# Patient Record
Sex: Male | Born: 1940 | Race: White | Hispanic: No | Smoking: Current every day smoker
Health system: Southern US, Community
[De-identification: ages and names within clinical notes are randomized; demographics above are authoritative.]

## PROBLEM LIST (undated history)

## (undated) DIAGNOSIS — C801 Malignant (primary) neoplasm, unspecified: Secondary | ICD-10-CM

## (undated) DIAGNOSIS — G4733 Obstructive sleep apnea (adult) (pediatric): Secondary | ICD-10-CM

## (undated) DIAGNOSIS — K254 Chronic or unspecified gastric ulcer with hemorrhage: Secondary | ICD-10-CM

## (undated) DIAGNOSIS — J449 Chronic obstructive pulmonary disease, unspecified: Secondary | ICD-10-CM

## (undated) DIAGNOSIS — L0390 Cellulitis, unspecified: Secondary | ICD-10-CM

## (undated) DIAGNOSIS — K635 Polyp of colon: Secondary | ICD-10-CM

## (undated) DIAGNOSIS — N400 Enlarged prostate without lower urinary tract symptoms: Secondary | ICD-10-CM

## (undated) DIAGNOSIS — I10 Essential (primary) hypertension: Secondary | ICD-10-CM

## (undated) DIAGNOSIS — M549 Dorsalgia, unspecified: Secondary | ICD-10-CM

## (undated) DIAGNOSIS — K5521 Angiodysplasia of colon with hemorrhage: Secondary | ICD-10-CM

## (undated) DIAGNOSIS — M1990 Unspecified osteoarthritis, unspecified site: Secondary | ICD-10-CM

## (undated) DIAGNOSIS — E119 Type 2 diabetes mellitus without complications: Secondary | ICD-10-CM

## (undated) DIAGNOSIS — E785 Hyperlipidemia, unspecified: Secondary | ICD-10-CM

## (undated) DIAGNOSIS — I739 Peripheral vascular disease, unspecified: Secondary | ICD-10-CM

## (undated) DIAGNOSIS — G8929 Other chronic pain: Secondary | ICD-10-CM

## (undated) DIAGNOSIS — Z9989 Dependence on other enabling machines and devices: Secondary | ICD-10-CM

## (undated) DIAGNOSIS — I779 Disorder of arteries and arterioles, unspecified: Secondary | ICD-10-CM

## (undated) DIAGNOSIS — K269 Duodenal ulcer, unspecified as acute or chronic, without hemorrhage or perforation: Secondary | ICD-10-CM

## (undated) HISTORY — DX: Benign prostatic hyperplasia without lower urinary tract symptoms: N40.0

## (undated) HISTORY — DX: Other chronic pain: G89.29

## (undated) HISTORY — PX: JOINT REPLACEMENT: SHX530

## (undated) HISTORY — DX: Chronic obstructive pulmonary disease, unspecified: J44.9

## (undated) HISTORY — DX: Obstructive sleep apnea (adult) (pediatric): G47.33

## (undated) HISTORY — PX: REPLACEMENT TOTAL KNEE: SUR1224

## (undated) HISTORY — DX: Dependence on other enabling machines and devices: Z99.89

## (undated) HISTORY — DX: Disorder of arteries and arterioles, unspecified: I77.9

## (undated) HISTORY — DX: Peripheral vascular disease, unspecified: I73.9

## (undated) HISTORY — DX: Cellulitis, unspecified: L03.90

## (undated) HISTORY — DX: Dorsalgia, unspecified: M54.9

## (undated) HISTORY — PX: RETINAL DETACHMENT SURGERY: SHX105

## (undated) HISTORY — PX: CHOLECYSTECTOMY: SHX55

---

## 1963-12-26 HISTORY — PX: THROAT SURGERY: SHX803

## 2004-11-27 LAB — BASIC METABOLIC PANEL: GLUCOSE: 43 mg/dL

## 2014-05-30 LAB — PSA: PSA: 2.2

## 2014-06-09 LAB — TSH: TSH: 3.75 u[IU]/mL (ref <=5.90)

## 2014-11-27 LAB — HEPATIC FUNCTION PANEL
ALK PHOS: 64 U/L (ref 25–125)
ALT: 17 U/L (ref 10–40)
AST: 35 U/L (ref 14–40)
Bilirubin, Total: 0.5 mg/dL

## 2014-11-27 LAB — BASIC METABOLIC PANEL
BUN: 24 mg/dL — AB (ref 4–21)
Creatinine: 1 mg/dL (ref <=1.3)
Potassium: 4.2 mmol/L (ref 3.4–5.3)
Sodium: 143 mmol/L (ref 137–147)

## 2014-11-27 LAB — HEMOGLOBIN A1C: HEMOGLOBIN A1C: 5.5 % (ref 4.0–6.0)

## 2015-03-01 ENCOUNTER — Encounter (HOSPITAL_BASED_OUTPATIENT_CLINIC_OR_DEPARTMENT_OTHER): Payer: Self-pay | Admitting: *Deleted

## 2015-03-01 ENCOUNTER — Emergency Department (HOSPITAL_BASED_OUTPATIENT_CLINIC_OR_DEPARTMENT_OTHER)
Admission: EM | Admit: 2015-03-01 | Discharge: 2015-03-01 | Disposition: A | Discharge status: Home / Self Care | Payer: Medicare Other | Attending: Emergency Medicine | Admitting: Emergency Medicine

## 2015-03-01 DIAGNOSIS — E119 Type 2 diabetes mellitus without complications: Secondary | ICD-10-CM | POA: Insufficient documentation

## 2015-03-01 DIAGNOSIS — I10 Essential (primary) hypertension: Secondary | ICD-10-CM | POA: Insufficient documentation

## 2015-03-01 DIAGNOSIS — E785 Hyperlipidemia, unspecified: Secondary | ICD-10-CM | POA: Insufficient documentation

## 2015-03-01 DIAGNOSIS — Z79899 Other long term (current) drug therapy: Secondary | ICD-10-CM | POA: Insufficient documentation

## 2015-03-01 DIAGNOSIS — Z720 Tobacco use: Secondary | ICD-10-CM | POA: Insufficient documentation

## 2015-03-01 DIAGNOSIS — L0231 Cutaneous abscess of buttock: Secondary | ICD-10-CM | POA: Insufficient documentation

## 2015-03-01 HISTORY — DX: Essential (primary) hypertension: I10

## 2015-03-01 HISTORY — DX: Type 2 diabetes mellitus without complications: E11.9

## 2015-03-01 HISTORY — DX: Hyperlipidemia, unspecified: E78.5

## 2015-03-01 MED ORDER — LIDOCAINE HCL (PF) 1 % IJ SOLN
INTRAMUSCULAR | Status: AC
  Administered 2015-03-01: 5 mL
  Filled 2015-03-01: qty 5

## 2015-03-01 MED ORDER — CEPHALEXIN 500 MG PO CAPS: 500.0000 mg | ORAL_CAPSULE | Freq: Four times a day (QID) | ORAL | Status: DC

## 2015-03-01 MED ORDER — SULFAMETHOXAZOLE-TRIMETHOPRIM 800-160 MG PO TABS: 1.0000 | ORAL_TABLET | Freq: Two times a day (BID) | ORAL | Status: DC

## 2015-03-01 NOTE — ED Notes (Signed)
D/c home- directed to pharmacy to pick up meds

## 2015-03-01 NOTE — ED Provider Notes (Signed)
CSN: [id number]     Arrival date & time 03/01/15  1408 History   First MD Initiated Contact with Patient 03/01/15 1442     Chief Complaint  Patient presents with  . Abscess     (Consider location/radiation/quality/duration/timing/severity/associated sxs/prior Treatment) Patient is a 74 y.o. male presenting with abscess. The history is provided by the patient.  Abscess Abscess location: Left buttock. Abscess quality: fluctuance, induration, painful and redness   Abscess quality: not draining   Duration:  4 days Progression:  Worsening Pain details:    Severity:  Moderate   Timing:  Constant   Progression:  Worsening Chronicity:  New   Past Medical History  Diagnosis Date  . Hypertension   . Diabetes mellitus without complication   . Hyperlipidemia    Past Surgical History  Procedure Laterality Date  . Replacement total knee    . Cholecystectomy     No family history on file. History  Substance Use Topics  . Smoking status: Current Every Day Smoker  . Smokeless tobacco: Not on file  . Alcohol Use: Yes    Review of Systems  All other systems reviewed and are negative.     Allergies  Review of patient's allergies indicates no known allergies.  Home Medications   Prior to Admission medications   Medication Sig Start Date End Date Taking? Authorizing Provider  cyclobenzaprine (FLEXERIL) 10 MG tablet Take 10 mg by mouth 3 (three) times daily as needed for muscle spasms.   Yes Historical Provider, MD  lisinopril-hydrochlorothiazide (PRINZIDE,ZESTORETIC) 20-25 MG per tablet Take 1 tablet by mouth daily.   Yes Historical Provider, MD  metFORMIN (GLUCOPHAGE) 500 MG tablet Take by mouth 2 (two) times daily with a meal.   Yes Historical Provider, MD  naproxen sodium (ANAPROX) 550 MG tablet Take 550 mg by mouth 2 (two) times daily with a meal.   Yes Historical Provider, MD  Pitavastatin Calcium 2 MG TABS Take by mouth.   Yes Historical Provider, MD   BP 148/65 mmHg   Pulse 74  Temp(Src) 97.3 F (36.3 C) (Oral)  Resp 18  Ht 5\' 11"  (1.803 m)  Wt 220 lb (99.791 kg)  BMI 30.70 kg/m2  SpO2 98% Physical Exam  Constitutional: He is oriented to person, place, and time. He appears well-developed and well-nourished. No distress.  HENT:  Head: Normocephalic and atraumatic.  Neck: Normal range of motion. Neck supple.  Neurological: He is alert and oriented to person, place, and time.  Skin: He is not diaphoretic.  There is a 3 cm round, indurated, erythematous abscess to the left buttock.  Nursing note and vitals reviewed.   ED Course  Procedures (including critical care time) Labs Review Labs Reviewed - No data to display  Imaging Review No results found.  INCISION AND DRAINAGE Performed by: Veryl Speak Consent: Verbal consent obtained. Risks and benefits: risks, benefits and alternatives were discussed Type: abscess  Body area: Left buttock  Anesthesia: local infiltration  Incision was made with a scalpel.  Local anesthetic: lidocaine 1 % without epinephrine  Anesthetic total: 2 ml  Complexity: complex Blunt dissection to break up loculations  Drainage: purulent  Drainage amount: Moderate   Packing material: None   Patient tolerance: Patient tolerated the procedure well with no immediate complications.      MDM   Final diagnoses:  None    Patient presents with buttock abscess. This was incised and drained of a significant quantity of purulent material. There is some redness to the  overlying skin. We'll treat with Keflex, Bactrim, warm soaks, and when necessary return.    Veryl Speak, MD 03/01/15 [id number]

## 2015-03-01 NOTE — Discharge Instructions (Signed)
Keflex and Bactrim as prescribed.  Warm soaks to the area as frequently as possible for the next 2-3 days.  Return to the ER for increased redness, pain, fever, or other new and concerning symptoms.   Abscess An abscess is an infected area that contains a collection of pus and debris.It can occur in almost any part of the body. An abscess is also known as a furuncle or boil. CAUSES  An abscess occurs when tissue gets infected. This can occur from blockage of oil or sweat glands, infection of hair follicles, or a minor injury to the skin. As the body tries to fight the infection, pus collects in the area and creates pressure under the skin. This pressure causes pain. People with weakened immune systems have difficulty fighting infections and get certain abscesses more often.  SYMPTOMS Usually an abscess develops on the skin and becomes a painful mass that is red, warm, and tender. If the abscess forms under the skin, you may feel a moveable soft area under the skin. Some abscesses break open (rupture) on their own, but most will continue to get worse without care. The infection can spread deeper into the body and eventually into the bloodstream, causing you to feel ill.  DIAGNOSIS  Your caregiver will take your medical history and perform a physical exam. A sample of fluid may also be taken from the abscess to determine what is causing your infection. TREATMENT  Your caregiver may prescribe antibiotic medicines to fight the infection. However, taking antibiotics alone usually does not cure an abscess. Your caregiver may need to make a small cut (incision) in the abscess to drain the pus. In some cases, gauze is packed into the abscess to reduce pain and to continue draining the area. HOME CARE INSTRUCTIONS   Only take over-the-counter or prescription medicines for pain, discomfort, or fever as directed by your caregiver.  If you were prescribed antibiotics, take them as directed. Finish them  even if you start to feel better.  If gauze is used, follow your caregiver's directions for changing the gauze.  To avoid spreading the infection:  Keep your draining abscess covered with a bandage.  Wash your hands well.  Do not share personal care items, towels, or whirlpools with others.  Avoid skin contact with others.  Keep your skin and clothes clean around the abscess.  Keep all follow-up appointments as directed by your caregiver. SEEK MEDICAL CARE IF:   You have increased pain, swelling, redness, fluid drainage, or bleeding.  You have muscle aches, chills, or a general ill feeling.  You have a fever. MAKE SURE YOU:   Understand these instructions.  Will watch your condition.  Will get help right away if you are not doing well or get worse. Document Released: 09/20/2005 Document Revised: 06/11/2012 Document Reviewed: 02/23/2012 [facility] Patient Information 2015 [place], [place]. This information is not intended to replace advice given to you by your health care provider. Make sure you discuss any questions you have with your health care provider.

## 2015-03-01 NOTE — ED Notes (Signed)
Pt has a painful abscess on his buttocks x4-5 days.

## 2015-03-17 ENCOUNTER — Telehealth: Payer: Self-pay | Admitting: *Deleted

## 2015-03-17 NOTE — Telephone Encounter (Signed)
Medical records received via fax from [facility] in [place], [place]. Forwarded to [employer]. New pt appt scheduled for 04/23/15. JG//CMA

## 2015-03-24 ENCOUNTER — Ambulatory Visit: Payer: Medicare Other | Admitting: Physician Assistant

## 2015-04-16 DIAGNOSIS — M7541 Impingement syndrome of right shoulder: Secondary | ICD-10-CM | POA: Diagnosis not present

## 2015-04-22 ENCOUNTER — Encounter: Payer: Self-pay | Admitting: General Practice

## 2015-04-23 ENCOUNTER — Ambulatory Visit: Payer: Medicare Other | Admitting: Family Medicine

## 2015-05-18 ENCOUNTER — Other Ambulatory Visit: Payer: Self-pay | Admitting: Orthopaedic Surgery

## 2015-05-21 ENCOUNTER — Encounter (HOSPITAL_BASED_OUTPATIENT_CLINIC_OR_DEPARTMENT_OTHER)
Admission: RE | Admit: 2015-05-21 | Discharge: 2015-05-21 | Disposition: A | Discharge status: Home / Self Care | Payer: Medicare Other | Source: Ambulatory Visit | Attending: Orthopaedic Surgery | Admitting: Orthopaedic Surgery

## 2015-05-21 ENCOUNTER — Encounter (HOSPITAL_BASED_OUTPATIENT_CLINIC_OR_DEPARTMENT_OTHER): Payer: Self-pay | Admitting: *Deleted

## 2015-05-21 DIAGNOSIS — Z01818 Encounter for other preprocedural examination: Secondary | ICD-10-CM | POA: Insufficient documentation

## 2015-05-21 LAB — BASIC METABOLIC PANEL
ANION GAP: 8 (ref 5–15)
BUN: 26 mg/dL — ABNORMAL HIGH (ref 6–20)
CALCIUM: 10.1 mg/dL (ref 8.9–10.3)
CHLORIDE: 102 mmol/L (ref 101–111)
CO2: 29 mmol/L (ref 22–32)
Creatinine, Ser: 1.29 mg/dL — ABNORMAL HIGH (ref 0.61–1.24)
GFR calc Af Amer: >60 mL/min (ref >60)
GFR calc non Af Amer: 52 mL/min — ABNORMAL LOW (ref >60)
Glucose, Bld: 103 mg/dL — ABNORMAL HIGH (ref 65–99)
Potassium: 4.3 mmol/L (ref 3.5–5.1)
SODIUM: 139 mmol/L (ref 135–145)

## 2015-05-21 NOTE — Progress Notes (Signed)
Patient came in for preadmission history and blood work and EKG. States he lives alone, has a nephew who can bring him and pick him up but cannot stay the night. Told pt he would have to stay night in [place], have someone pick him up by 0730.

## 2015-05-26 NOTE — H&P (Signed)
Todd George is an 74 y.o. male.   Chief Complaint: right shoulder pain HPI: [place] continues with some terrible right shoulder pain.  When he tries to use it out to the side or overhead it hurts severely.  He has had trouble for about 10 years.  Normally he can sleep fairly well at night.  He has had 2 injections.    MRI:  I reviewed an MRI scan films and report of a study done at the [facility] on 04/26/15.  This shows some irritation of his rotator cuff with no significant tear.  They also read some moderate degeneration of the [facility] joint.  Past Medical History  Diagnosis Date  . Hypertension   . Diabetes mellitus without complication   . Hyperlipidemia   . Cellulitis   . BPH (benign prostatic hyperplasia)   . Chronic back pain   . Carotid artery disease   . COPD (chronic obstructive pulmonary disease)     2ppd  . OSA on CPAP     not used machine in over 10 yrs    Past Surgical History  Procedure Laterality Date  . Replacement total knee Left   . Cholecystectomy    . Joint replacement    . Throat surgery  1965    History reviewed. No pertinent family history. Social History:  reports that he has been smoking.  He does not have any smokeless tobacco history on file. He reports that he drinks alcohol. He reports that he does not use illicit drugs.  Allergies: No Known Allergies  No prescriptions prior to admission    No results found for this or any previous visit (from the past 48 hour(s)). No results found.  Review of Systems  Musculoskeletal: Positive for joint pain.       Right shoulder  All other systems reviewed and are negative.   Height 6\' 1"  (1.854 m), weight 104.327 kg (230 lb). Physical Exam  Constitutional: He is oriented to person, place, and time. He appears well-developed and well-nourished.  HENT:  Head: Normocephalic and atraumatic.  Eyes: Conjunctivae are normal. Pupils are equal, round, and reactive to light.  Neck: Normal range of motion.   Cardiovascular: Normal rate and regular rhythm.   Respiratory: Effort normal.  GI: Soft.  Musculoskeletal:  Right shoulder motion is nearly full.  He has some significant secondary impingement pain.  He has good cuff strength though it's very painful to resisted forward flexion.  Cervical motion is full and there is no palpable lymphadenopathy.  He does have some pain at his [facility] joint today and some pain to cross chest maneuvers.  Sensation and motor function are intact in his hands with palpable pulses on both sides.    Neurological: He is alert and oriented to person, place, and time.  Skin: Skin is warm and dry.  Psychiatric: He has a normal mood and affect. His behavior is normal. Judgment and thought content normal.     Assessment/Plan Assessment:  Right shoulder impingement and AC degeneration injected most recently 03/20/15  Plan: [place] has some disabling pain in the shoulder.  I think he would be a good candidate for an arthroscopy.  Our plan will be to perform an acromioplasty and formal AC resection.  I reviewed risk of anesthesia and infection related to this intervention.  He says his niece in law might be able to drive him home and stay with him the first night.    Teiara Baria, Larwance Sachs 05/26/2015, 1:36 PM

## 2015-05-28 ENCOUNTER — Ambulatory Visit (HOSPITAL_BASED_OUTPATIENT_CLINIC_OR_DEPARTMENT_OTHER)
Admission: RE | Admit: 2015-05-28 | Discharge: 2015-05-28 | Disposition: A | Discharge status: Home / Self Care | Payer: Medicare Other | Source: Ambulatory Visit | Attending: Orthopaedic Surgery | Admitting: Orthopaedic Surgery

## 2015-05-28 ENCOUNTER — Encounter (HOSPITAL_BASED_OUTPATIENT_CLINIC_OR_DEPARTMENT_OTHER)
Admission: RE | Disposition: A | Discharge status: Home / Self Care | Payer: Self-pay | Source: Ambulatory Visit | Attending: Orthopaedic Surgery

## 2015-05-28 ENCOUNTER — Ambulatory Visit (HOSPITAL_BASED_OUTPATIENT_CLINIC_OR_DEPARTMENT_OTHER): Payer: Medicare Other | Admitting: Anesthesiology

## 2015-05-28 ENCOUNTER — Encounter (HOSPITAL_BASED_OUTPATIENT_CLINIC_OR_DEPARTMENT_OTHER): Payer: Self-pay | Admitting: Anesthesiology

## 2015-05-28 DIAGNOSIS — I10 Essential (primary) hypertension: Secondary | ICD-10-CM | POA: Diagnosis not present

## 2015-05-28 DIAGNOSIS — S46211A Strain of muscle, fascia and tendon of other parts of biceps, right arm, initial encounter: Secondary | ICD-10-CM | POA: Insufficient documentation

## 2015-05-28 DIAGNOSIS — E119 Type 2 diabetes mellitus without complications: Secondary | ICD-10-CM | POA: Diagnosis not present

## 2015-05-28 DIAGNOSIS — M25811 Other specified joint disorders, right shoulder: Secondary | ICD-10-CM | POA: Insufficient documentation

## 2015-05-28 DIAGNOSIS — I2510 Atherosclerotic heart disease of native coronary artery without angina pectoris: Secondary | ICD-10-CM | POA: Diagnosis not present

## 2015-05-28 DIAGNOSIS — Y929 Unspecified place or not applicable: Secondary | ICD-10-CM | POA: Diagnosis not present

## 2015-05-28 DIAGNOSIS — X58XXXA Exposure to other specified factors, initial encounter: Secondary | ICD-10-CM | POA: Insufficient documentation

## 2015-05-28 DIAGNOSIS — E785 Hyperlipidemia, unspecified: Secondary | ICD-10-CM | POA: Insufficient documentation

## 2015-05-28 DIAGNOSIS — N400 Enlarged prostate without lower urinary tract symptoms: Secondary | ICD-10-CM | POA: Diagnosis not present

## 2015-05-28 DIAGNOSIS — G4733 Obstructive sleep apnea (adult) (pediatric): Secondary | ICD-10-CM | POA: Diagnosis not present

## 2015-05-28 DIAGNOSIS — J449 Chronic obstructive pulmonary disease, unspecified: Secondary | ICD-10-CM | POA: Insufficient documentation

## 2015-05-28 DIAGNOSIS — G8929 Other chronic pain: Secondary | ICD-10-CM | POA: Insufficient documentation

## 2015-05-28 DIAGNOSIS — F17210 Nicotine dependence, cigarettes, uncomplicated: Secondary | ICD-10-CM | POA: Insufficient documentation

## 2015-05-28 DIAGNOSIS — M549 Dorsalgia, unspecified: Secondary | ICD-10-CM | POA: Diagnosis not present

## 2015-05-28 DIAGNOSIS — M19011 Primary osteoarthritis, right shoulder: Secondary | ICD-10-CM | POA: Diagnosis not present

## 2015-05-28 HISTORY — PX: SHOULDER ARTHROSCOPY WITH DISTAL CLAVICLE RESECTION: SHX5675

## 2015-05-28 HISTORY — PX: SHOULDER ARTHROSCOPY WITH SUBACROMIAL DECOMPRESSION: SHX5684

## 2015-05-28 LAB — GLUCOSE, CAPILLARY
GLUCOSE-CAPILLARY: 100 mg/dL — AB (ref 65–99)
GLUCOSE-CAPILLARY: 110 mg/dL — AB (ref 65–99)

## 2015-05-28 LAB — POCT HEMOGLOBIN-HEMACUE: HEMOGLOBIN: 15.3 g/dL (ref 13.0–17.0)

## 2015-05-28 SURGERY — SHOULDER ARTHROSCOPY WITH SUBACROMIAL DECOMPRESSION
Anesthesia: Regional | Site: Shoulder | Laterality: Right

## 2015-05-28 MED ORDER — FENTANYL CITRATE (PF) 100 MCG/2ML IJ SOLN
INTRAMUSCULAR | Status: AC
  Filled 2015-05-28: qty 2

## 2015-05-28 MED ORDER — CHLORHEXIDINE GLUCONATE 4 % EX LIQD: 60.0000 mL | Freq: Once | CUTANEOUS | Status: DC

## 2015-05-28 MED ORDER — PHENYLEPHRINE HCL 10 MG/ML IJ SOLN
INTRAMUSCULAR | Status: DC | PRN
  Administered 2015-05-28 (×2): 80 ug via INTRAVENOUS
  Administered 2015-05-28: 40 ug via INTRAVENOUS
  Administered 2015-05-28 (×2): 80 ug via INTRAVENOUS

## 2015-05-28 MED ORDER — HYDROCODONE-ACETAMINOPHEN 5-325 MG PO TABS: 1.0000 | ORAL_TABLET | Freq: Four times a day (QID) | ORAL | Status: DC | PRN

## 2015-05-28 MED ORDER — PHENYLEPHRINE HCL 10 MG/ML IJ SOLN
10.0000 mg | INTRAVENOUS | Status: DC | PRN
  Administered 2015-05-28: 50 ug/min via INTRAVENOUS

## 2015-05-28 MED ORDER — PROMETHAZINE HCL 25 MG/ML IJ SOLN: 6.2500 mg | INTRAMUSCULAR | Status: DC | PRN

## 2015-05-28 MED ORDER — PROPOFOL 10 MG/ML IV BOLUS
INTRAVENOUS | Status: DC | PRN
  Administered 2015-05-28: 150 mg via INTRAVENOUS

## 2015-05-28 MED ORDER — HYDROCODONE-ACETAMINOPHEN 7.5-325 MG PO TABS: 1.0000 | ORAL_TABLET | Freq: Once | ORAL | Status: DC | PRN

## 2015-05-28 MED ORDER — GLYCOPYRROLATE 0.2 MG/ML IJ SOLN
INTRAMUSCULAR | Status: DC | PRN
  Administered 2015-05-28 (×2): 0.2 mg via INTRAVENOUS

## 2015-05-28 MED ORDER — FENTANYL CITRATE (PF) 100 MCG/2ML IJ SOLN
INTRAMUSCULAR | Status: DC | PRN
  Administered 2015-05-28 (×2): 50 ug via INTRAVENOUS

## 2015-05-28 MED ORDER — LACTATED RINGERS IV SOLN: INTRAVENOUS | Status: DC

## 2015-05-28 MED ORDER — EPHEDRINE SULFATE 50 MG/ML IJ SOLN
INTRAMUSCULAR | Status: DC | PRN
  Administered 2015-05-28: 10 mg via INTRAVENOUS

## 2015-05-28 MED ORDER — HYDROMORPHONE HCL 1 MG/ML IJ SOLN: 0.2500 mg | INTRAMUSCULAR | Status: DC | PRN

## 2015-05-28 MED ORDER — CEFAZOLIN SODIUM-DEXTROSE 2-3 GM-% IV SOLR
2.0000 g | INTRAVENOUS | Status: AC
  Administered 2015-05-28: 2 g via INTRAVENOUS

## 2015-05-28 MED ORDER — LACTATED RINGERS IV SOLN
INTRAVENOUS | Status: DC
  Administered 2015-05-28 (×2): via INTRAVENOUS

## 2015-05-28 MED ORDER — CEFAZOLIN SODIUM-DEXTROSE 2-3 GM-% IV SOLR
INTRAVENOUS | Status: AC
  Filled 2015-05-28: qty 50

## 2015-05-28 MED ORDER — FENTANYL CITRATE (PF) 100 MCG/2ML IJ SOLN
50.0000 ug | INTRAMUSCULAR | Status: DC | PRN
  Administered 2015-05-28: 50 ug via INTRAVENOUS

## 2015-05-28 MED ORDER — LIDOCAINE HCL (CARDIAC) 20 MG/ML IV SOLN
INTRAVENOUS | Status: DC | PRN
  Administered 2015-05-28: 50 mg via INTRAVENOUS

## 2015-05-28 MED ORDER — DEXAMETHASONE SODIUM PHOSPHATE 4 MG/ML IJ SOLN
INTRAMUSCULAR | Status: DC | PRN
  Administered 2015-05-28: 10 mg via INTRAVENOUS

## 2015-05-28 MED ORDER — GLYCOPYRROLATE 0.2 MG/ML IJ SOLN
0.2000 mg | Freq: Once | INTRAMUSCULAR | Status: DC | PRN
Start: 2015-05-28 — End: 2015-05-28

## 2015-05-28 MED ORDER — MIDAZOLAM HCL 2 MG/2ML IJ SOLN
INTRAMUSCULAR | Status: AC
  Filled 2015-05-28: qty 2

## 2015-05-28 MED ORDER — ONDANSETRON HCL 4 MG/2ML IJ SOLN
INTRAMUSCULAR | Status: DC | PRN
  Administered 2015-05-28: 4 mg via INTRAVENOUS

## 2015-05-28 MED ORDER — SUCCINYLCHOLINE CHLORIDE 20 MG/ML IJ SOLN
INTRAMUSCULAR | Status: DC | PRN
  Administered 2015-05-28: 50 mg via INTRAVENOUS

## 2015-05-28 MED ORDER — ROPIVACAINE HCL 5 MG/ML IJ SOLN
INTRAMUSCULAR | Status: DC | PRN
  Administered 2015-05-28: 25 mL via PERINEURAL

## 2015-05-28 MED ORDER — FENTANYL CITRATE (PF) 100 MCG/2ML IJ SOLN
INTRAMUSCULAR | Status: AC
  Filled 2015-05-28: qty 6

## 2015-05-28 MED ORDER — MIDAZOLAM HCL 2 MG/2ML IJ SOLN
1.0000 mg | INTRAMUSCULAR | Status: DC | PRN
  Administered 2015-05-28: 1 mg via INTRAVENOUS

## 2015-05-28 SURGICAL SUPPLY — 65 items
BENZOIN TINCTURE PRP APPL 2/3 (GAUZE/BANDAGES/DRESSINGS) IMPLANT
BLADE CUDA 5.5 (BLADE) IMPLANT
BLADE GREAT WHITE 4.2 (BLADE) ×3 IMPLANT
BLADE SURG 15 STRL LF DISP TIS (BLADE) IMPLANT
BLADE SURG 15 STRL SS (BLADE)
BUR VERTEX HOODED 4.5 (BURR) IMPLANT
CANNULA SHOULDER 7CM (CANNULA) ×3 IMPLANT
CANNULA TWIST IN 8.25X7CM (CANNULA) IMPLANT
DECANTER SPIKE VIAL GLASS SM (MISCELLANEOUS) IMPLANT
DRAPE STERI 35X30 U-POUCH (DRAPES) ×3 IMPLANT
DRAPE U-SHAPE 47X51 STRL (DRAPES) ×3 IMPLANT
DRAPE U-SHAPE 76X120 STRL (DRAPES) ×6 IMPLANT
DRSG EMULSION OIL 3X3 NADH (GAUZE/BANDAGES/DRESSINGS) ×3 IMPLANT
DRSG PAD ABDOMINAL 8X10 ST (GAUZE/BANDAGES/DRESSINGS) ×3 IMPLANT
DURAPREP 26ML APPLICATOR (WOUND CARE) ×3 IMPLANT
ELECT MENISCUS 165MM 90D (ELECTRODE) IMPLANT
ELECT REM PT RETURN 9FT ADLT (ELECTROSURGICAL) ×3
ELECTRODE REM PT RTRN 9FT ADLT (ELECTROSURGICAL) ×2 IMPLANT
GAUZE SPONGE 4X4 12PLY STRL (GAUZE/BANDAGES/DRESSINGS) ×3 IMPLANT
GLOVE BIO SURGEON STRL SZ 6.5 (GLOVE) ×3 IMPLANT
GLOVE BIO SURGEON STRL SZ8 (GLOVE) ×6 IMPLANT
GLOVE BIOGEL PI IND STRL 7.0 (GLOVE) ×4 IMPLANT
GLOVE BIOGEL PI IND STRL 8 (GLOVE) ×4 IMPLANT
GLOVE BIOGEL PI INDICATOR 7.0 (GLOVE) ×2
GLOVE BIOGEL PI INDICATOR 8 (GLOVE) ×2
GOWN STRL REUS W/ TWL LRG LVL3 (GOWN DISPOSABLE) ×4 IMPLANT
GOWN STRL REUS W/ TWL XL LVL3 (GOWN DISPOSABLE) ×4 IMPLANT
GOWN STRL REUS W/TWL LRG LVL3 (GOWN DISPOSABLE) ×2
GOWN STRL REUS W/TWL XL LVL3 (GOWN DISPOSABLE) ×2
LIQUID BAND (GAUZE/BANDAGES/DRESSINGS) IMPLANT
MANIFOLD NEPTUNE II (INSTRUMENTS) ×3 IMPLANT
NDL SUT 6 .5 CRC .975X.05 MAYO (NEEDLE) IMPLANT
NEEDLE MAYO TAPER (NEEDLE)
NEEDLE SCORPION MULTI FIRE (NEEDLE) IMPLANT
NS IRRIG 1000ML POUR BTL (IV SOLUTION) IMPLANT
PACK ARTHROSCOPY DSU (CUSTOM PROCEDURE TRAY) ×3 IMPLANT
PACK BASIN DAY SURGERY FS (CUSTOM PROCEDURE TRAY) ×3 IMPLANT
PASSER SUT SWANSON 36MM LOOP (INSTRUMENTS) IMPLANT
PENCIL BUTTON HOLSTER BLD 10FT (ELECTRODE) IMPLANT
SET ARTHROSCOPY TUBING (MISCELLANEOUS) ×1
SET ARTHROSCOPY TUBING LN (MISCELLANEOUS) ×2 IMPLANT
SHEET MEDIUM DRAPE 40X70 STRL (DRAPES) ×3 IMPLANT
SLEEVE SCD COMPRESS KNEE MED (MISCELLANEOUS) IMPLANT
SLING ARM LRG ADULT FOAM STRAP (SOFTGOODS) ×3 IMPLANT
SLING ARM MED ADULT FOAM STRAP (SOFTGOODS) IMPLANT
SLING ARM SM FOAM STRAP (SOFTGOODS) IMPLANT
SLING ARM XL FOAM STRAP (SOFTGOODS) IMPLANT
SPONGE LAP 4X18 X RAY DECT (DISPOSABLE) IMPLANT
STRIP CLOSURE SKIN 1/2X4 (GAUZE/BANDAGES/DRESSINGS) IMPLANT
SUCTION FRAZIER TIP 10 FR DISP (SUCTIONS) IMPLANT
SUT ETHIBOND 2 OS 4 DA (SUTURE) IMPLANT
SUT ETHILON 3 0 PS 1 (SUTURE) ×3 IMPLANT
SUT FIBERWIRE #2 38 T-5 BLUE (SUTURE)
SUT PDS AB 2-0 CT2 27 (SUTURE) IMPLANT
SUT VIC AB 0 SH 27 (SUTURE) IMPLANT
SUT VIC AB 2-0 SH 27 (SUTURE)
SUT VIC AB 2-0 SH 27XBRD (SUTURE) IMPLANT
SUT VICRYL 4-0 PS2 18IN ABS (SUTURE) IMPLANT
SUTURE FIBERWR #2 38 T-5 BLUE (SUTURE) IMPLANT
SYR BULB 3OZ (MISCELLANEOUS) IMPLANT
TOWEL OR 17X24 6PK STRL BLUE (TOWEL DISPOSABLE) ×3 IMPLANT
TOWEL OR NON WOVEN STRL DISP B (DISPOSABLE) ×3 IMPLANT
WAND STAR VAC 90 (SURGICAL WAND) ×3 IMPLANT
WATER STERILE IRR 1000ML POUR (IV SOLUTION) ×3 IMPLANT
YANKAUER SUCT BULB TIP NO VENT (SUCTIONS) IMPLANT

## 2015-05-28 NOTE — Discharge Instructions (Signed)
°  Post Anesthesia Home Care Instructions ° °Activity: °Get plenty of rest for the remainder of the day. A responsible adult should stay with you for 24 hours following the procedure.  °For the next 24 hours, DO NOT: °-Drive a car °-Operate machinery °-Drink alcoholic beverages °-Take any medication unless instructed by your physician °-Make any legal decisions or sign important papers. ° °Meals: °Start with liquid foods such as gelatin or soup. Progress to regular foods as tolerated. Avoid greasy, spicy, heavy foods. If nausea and/or vomiting occur, drink only clear liquids until the nausea and/or vomiting subsides. Call your physician if vomiting continues. ° °Special Instructions/Symptoms: °Your throat may feel dry or sore from the anesthesia or the breathing tube placed in your throat during surgery. If this causes discomfort, gargle with warm salt water. The discomfort should disappear within 24 hours. ° °If you had a scopolamine patch placed behind your ear for the management of post- operative nausea and/or vomiting: ° °1. The medication in the patch is effective for 72 hours, after which it should be removed.  Wrap patch in a tissue and discard in the trash. Wash hands thoroughly with soap and water. °2. You may remove the patch earlier than 72 hours if you experience unpleasant side effects which may include dry mouth, dizziness or visual disturbances. °3. Avoid touching the patch. Wash your hands with soap and water after contact with the patch. °  °Regional Anesthesia Blocks ° °1. Numbness or the inability to move the "blocked" extremity may last from 3-48 hours after placement. The length of time depends on the medication injected and your individual response to the medication. If the numbness is not going away after 48 hours, call your surgeon. ° °2. The extremity that is blocked will need to be protected until the numbness is gone and the  Strength has returned. Because you cannot feel it, you will need  to take extra care to avoid injury. Because it may be weak, you may have difficulty moving it or using it. You may not know what position it is in without looking at it while the block is in effect. ° °3. For blocks in the legs and feet, returning to weight bearing and walking needs to be done carefully. You will need to wait until the numbness is entirely gone and the strength has returned. You should be able to move your leg and foot normally before you try and bear weight or walk. You will need someone to be with you when you first try to ensure you do not fall and possibly risk injury. ° °4. Bruising and tenderness at the needle site are common side effects and will resolve in a few days. ° °5. Persistent numbness or new problems with movement should be communicated to the surgeon or the [facility] ([phone])/ [facility] ([phone]). °

## 2015-05-28 NOTE — Progress Notes (Signed)
Assisted Dr. Rodman Comp with right, ultrasound guided, interscalene  block. Side rails up, monitors on throughout procedure. See vital signs in flow sheet. Tolerated Procedure well.

## 2015-05-28 NOTE — Anesthesia Postprocedure Evaluation (Signed)
  Anesthesia Post-op Note  Patient: Todd George  Procedure(s) Performed: Procedure(s): SHOULDER ARTHROSCOPY WITH SUBACROMIAL DECOMPRESSION (Right) SHOULDER ARTHROSCOPY WITH DISTAL CLAVICLE RESECTION (Right)  Patient Location: PACU  Anesthesia Type:GA combined with regional for post-op pain  Level of Consciousness: awake and alert   Airway and Oxygen Therapy: Patient Spontanous Breathing  Post-op Pain: none  Post-op Assessment: Post-op Vital signs reviewed  Post-op Vital Signs: Reviewed  Last Vitals:  Filed Vitals:   05/28/15 1545  BP:   Pulse: 63  Temp:   Resp: 16    Complications: No apparent anesthesia complications

## 2015-05-28 NOTE — Transfer of Care (Signed)
Immediate Anesthesia Transfer of Care Note  Patient: Todd George  Procedure(s) Performed: Procedure(s): SHOULDER ARTHROSCOPY WITH SUBACROMIAL DECOMPRESSION (Right) SHOULDER ARTHROSCOPY WITH DISTAL CLAVICLE RESECTION (Right)  Patient Location: PACU  Anesthesia Type:GA combined with regional for post-op pain  Level of Consciousness: sedated and patient cooperative  Airway & Oxygen Therapy: Patient Spontanous Breathing and Patient connected to face mask oxygen  Post-op Assessment: Report given to RN and Post -op Vital signs reviewed and stable  Post vital signs: Reviewed and stable  Last Vitals:  Filed Vitals:   05/28/15 1445  BP: 118/52  Pulse: 83  Temp:   Resp: 21    Complications: No apparent anesthesia complications

## 2015-05-28 NOTE — Anesthesia Procedure Notes (Addendum)
Anesthesia Regional Block:  Interscalene brachial plexus block  Pre-Anesthetic Checklist: ,, timeout performed, Correct Patient, Correct Site, Correct Laterality, Correct Procedure, Correct Position, site marked, Risks and benefits discussed,  Surgical consent,  Pre-op evaluation,  At surgeon's request and post-op pain management  Laterality: Right  Prep: chloraprep       Needles:  Injection technique: Single-shot  Needle Type: Echogenic Stimulator Needle     Needle Length: 9cm 9 cm Needle Gauge: 21 and 21 G    Additional Needles:  Procedures: ultrasound guided (picture in chart) and nerve stimulator Interscalene brachial plexus block  Nerve Stimulator or Paresthesia:  Response: deltoid, 0.45 mA,   Additional Responses:   Narrative:  Start time: 05/28/2015 12:55 PM End time: 05/28/2015 1:05 PM Injection made incrementally with aspirations every 5 mL.  Performed by: Personally  Anesthesiologist: Suzette Battiest  Additional Notes: Risks and benefits discussed with pt. Pt tolerated procedure well with no immediate complications.   Procedure Name: Intubation Date/Time: 05/28/2015 1:51 PM Performed by: Toula Moos L Pre-anesthesia Checklist: Patient identified, Emergency Drugs available, Suction available, Patient being monitored and Timeout performed Patient Re-evaluated:Patient Re-evaluated prior to inductionOxygen Delivery Method: Circle System Utilized Preoxygenation: Pre-oxygenation with 100% oxygen Intubation Type: IV induction Ventilation: Mask ventilation without difficulty Laryngoscope Size: Miller and 3 Grade View: Grade III Tube type: Oral Tube size: 8.0 mm Number of attempts: 1 Airway Equipment and Method: Stylet and Oral airway Placement Confirmation: ETT inserted through vocal cords under direct vision,  positive ETCO2 and breath sounds checked- equal and bilateral Tube secured with: Tape Dental Injury: Teeth and Oropharynx as per pre-operative  assessment

## 2015-05-28 NOTE — Anesthesia Preprocedure Evaluation (Addendum)
Anesthesia Evaluation  Patient identified by MRN, date of birth, ID band Patient awake    Reviewed: Allergy & Precautions, NPO status , Patient's Chart, lab work & pertinent test results, reviewed documented beta blocker date and time   Airway Mallampati: II  TM Distance: >3 FB Neck ROM: Full    Dental   Pulmonary sleep apnea , COPDCurrent Smoker,  breath sounds clear to auscultation        Cardiovascular hypertension, Pt. on medications and Pt. on home beta blockers + Peripheral Vascular Disease Rhythm:Regular Rate:Normal     Neuro/Psych negative neurological ROS     GI/Hepatic negative GI ROS, Neg liver ROS,   Endo/Other  diabetes, Type 2, Oral Hypoglycemic Agents  Renal/GU Renal InsufficiencyRenal disease     Musculoskeletal   Abdominal   Peds  Hematology negative hematology ROS (+)   Anesthesia Other Findings   Reproductive/Obstetrics                            Anesthesia Physical Anesthesia Plan  ASA: III  Anesthesia Plan: General and Regional   Post-op Pain Management:    Induction: Intravenous  Airway Management Planned: Oral ETT  Additional Equipment:   Intra-op Plan:   Post-operative Plan: Extubation in OR  Informed Consent: I have reviewed the patients History and Physical, chart, labs and discussed the procedure including the risks, benefits and alternatives for the proposed anesthesia with the patient or authorized representative who has indicated his/her understanding and acceptance.   Dental advisory given  Plan Discussed with: CRNA  Anesthesia Plan Comments:         Anesthesia Quick Evaluation

## 2015-05-28 NOTE — Op Note (Signed)
#  [id number] 

## 2015-05-28 NOTE — Interval H&P Note (Signed)
OK for surgery PD 

## 2015-05-31 NOTE — Op Note (Signed)
NAMENORTON, BIVINS NO.:  [id number]  MEDICAL RECORD NO.:  [id number]  LOCATION:                                FACILITY:  [facility]  PHYSICIAN:  Monico Blitz. Jacquees Gongora, M.D.DATE OF BIRTH:  08-12-1941  DATE OF PROCEDURE:  05/28/2015 DATE OF DISCHARGE:  05/28/2015                              OPERATIVE REPORT   PREOPERATIVE DIAGNOSES: 1. Right shoulder impingement. 2. Right shoulder acromioclavicular degeneration.  POSTOPERATIVE DIAGNOSES: 1. Right shoulder impingement. 2. Right shoulder acromioclavicular degeneration. 3. Right shoulder partial biceps tear.  PROCEDURES: 1. Right shoulder arthroscopic acromioplasty. 2. Right shoulder arthroscopic AC resection. 3. Right shoulder arthroscopic debridement, biceps tendon.  ANESTHESIA:  General and block.  ATTENDING SURGEON:  Monico Blitz. Rhona Raider, M.D.  ASSISTANT:  Loni Dolly, PA.  INDICATION FOR PROCEDURE:  The patient is a 74 year old man with about 10 years of right shoulder pain.  This is persisted despite various injections and therapies.  By MRI scan, he has impingement related to the shape of his acromion and AC joint.  He has pain which limits his ability to rest and use his arm, and he is offered an arthroscopy. Informed operative consent was obtained after discussion of possible complications including reaction to anesthesia and infection.  SUMMARY OF FINDINGS AND PROCEDURE:  Under general anesthesia and a block, a right shoulder arthroscopy was performed.  Glenohumeral joint showed no degenerative change, but the biceps tendon did have some partial thickness tearing.  I debrided this, but about 90% of the tendon was intact.  I pulled the structure down into the joint and again it looked like a viable structure and was not released.  The rotator cuff appeared intact from below.  In the subacromial space, he had irritation of this cuff consistent with impingement but no tear worthy of repair. An  acromioplasty was done for a moderately prominent subacromial morphology.  He had bone-on-bone contact at the [facility] joint; and a formal AC resection was done through an anterior portal.  He was discharged home same day to follow up in less than a week.  DESCRIPTION OF PROCEDURE:  The patient was taken to the operating suite where general anesthetic was applied without difficulty.  He was also given a block in the pre-anesthesia area.  He was positioned in a beach- chair position and prepped and draped in normal sterile fashion.  After the administration of preop IV Kefzol and an appropriate time out, an arthroscopy of the right shoulder was performed through a total of 3 portals.  Findings were as noted above, and procedure consisted predominantly of the acromioplasty and AC resection.  The acromioplasty was done with a burr in the lateral position, followed by transfer of the burr to the posterior position.  We performed the [facility] resection removing about a centimeter of the distal clavicle through an anterior portal.  I then debrided the cuff, but no tear worthy of repair was found.  In the glenohumeral joint, he did have the partial biceps tear which was addressed as described above.  The shoulder was thoroughly irrigated, followed by reapproximation of portals loosely with nylon. Adaptic was applied followed by dry gauze and tape.  Estimated  blood loss and fluids can be obtained from anesthesia records.  DISPOSITION:  The patient was extubated in the operating room and taken to recovery room in stable condition.  He is to go home same day and follow up in the office next week.  I will contact him by phone tonight.     Monico Blitz Rhona Raider, M.D.     PGD/MEDQ  D:  05/28/2015  T:  05/29/2015  Job:  [id number]

## 2015-06-01 ENCOUNTER — Encounter (HOSPITAL_BASED_OUTPATIENT_CLINIC_OR_DEPARTMENT_OTHER): Payer: Self-pay | Admitting: Orthopaedic Surgery

## 2015-08-17 ENCOUNTER — Ambulatory Visit: Payer: Medicare Other | Admitting: Family Medicine

## 2015-12-31 ENCOUNTER — Inpatient Hospital Stay (HOSPITAL_COMMUNITY)
Admission: EM | Admit: 2015-12-31 | Discharge: 2016-01-05 | DRG: 378 | Disposition: A | Discharge status: Home / Self Care | Payer: Medicare Other | Attending: Internal Medicine | Admitting: Internal Medicine

## 2015-12-31 ENCOUNTER — Emergency Department (HOSPITAL_COMMUNITY): Payer: Medicare Other

## 2015-12-31 ENCOUNTER — Encounter (HOSPITAL_COMMUNITY): Payer: Self-pay | Admitting: Emergency Medicine

## 2015-12-31 DIAGNOSIS — Z23 Encounter for immunization: Secondary | ICD-10-CM | POA: Diagnosis not present

## 2015-12-31 DIAGNOSIS — I779 Disorder of arteries and arterioles, unspecified: Secondary | ICD-10-CM | POA: Insufficient documentation

## 2015-12-31 DIAGNOSIS — D62 Acute posthemorrhagic anemia: Secondary | ICD-10-CM | POA: Diagnosis present

## 2015-12-31 DIAGNOSIS — K5521 Angiodysplasia of colon with hemorrhage: Secondary | ICD-10-CM | POA: Diagnosis present

## 2015-12-31 DIAGNOSIS — D128 Benign neoplasm of rectum: Secondary | ICD-10-CM | POA: Diagnosis not present

## 2015-12-31 DIAGNOSIS — K254 Chronic or unspecified gastric ulcer with hemorrhage: Secondary | ICD-10-CM | POA: Diagnosis present

## 2015-12-31 DIAGNOSIS — T39395A Adverse effect of other nonsteroidal anti-inflammatory drugs [NSAID], initial encounter: Secondary | ICD-10-CM | POA: Diagnosis present

## 2015-12-31 DIAGNOSIS — I10 Essential (primary) hypertension: Secondary | ICD-10-CM | POA: Diagnosis present

## 2015-12-31 DIAGNOSIS — E1165 Type 2 diabetes mellitus with hyperglycemia: Secondary | ICD-10-CM | POA: Diagnosis present

## 2015-12-31 DIAGNOSIS — K3189 Other diseases of stomach and duodenum: Secondary | ICD-10-CM | POA: Diagnosis not present

## 2015-12-31 DIAGNOSIS — G4733 Obstructive sleep apnea (adult) (pediatric): Secondary | ICD-10-CM | POA: Diagnosis present

## 2015-12-31 DIAGNOSIS — L0231 Cutaneous abscess of buttock: Secondary | ICD-10-CM | POA: Diagnosis present

## 2015-12-31 DIAGNOSIS — I959 Hypotension, unspecified: Secondary | ICD-10-CM | POA: Diagnosis present

## 2015-12-31 DIAGNOSIS — N400 Enlarged prostate without lower urinary tract symptoms: Secondary | ICD-10-CM | POA: Insufficient documentation

## 2015-12-31 DIAGNOSIS — J441 Chronic obstructive pulmonary disease with (acute) exacerbation: Secondary | ICD-10-CM | POA: Diagnosis present

## 2015-12-31 DIAGNOSIS — J449 Chronic obstructive pulmonary disease, unspecified: Secondary | ICD-10-CM | POA: Insufficient documentation

## 2015-12-31 DIAGNOSIS — K921 Melena: Secondary | ICD-10-CM | POA: Diagnosis present

## 2015-12-31 DIAGNOSIS — Z9989 Dependence on other enabling machines and devices: Secondary | ICD-10-CM | POA: Insufficient documentation

## 2015-12-31 DIAGNOSIS — K269 Duodenal ulcer, unspecified as acute or chronic, without hemorrhage or perforation: Secondary | ICD-10-CM | POA: Diagnosis present

## 2015-12-31 DIAGNOSIS — R7989 Other specified abnormal findings of blood chemistry: Secondary | ICD-10-CM

## 2015-12-31 DIAGNOSIS — Z860100 Personal history of colon polyps, unspecified: Secondary | ICD-10-CM

## 2015-12-31 DIAGNOSIS — E119 Type 2 diabetes mellitus without complications: Secondary | ICD-10-CM | POA: Diagnosis not present

## 2015-12-31 DIAGNOSIS — J069 Acute upper respiratory infection, unspecified: Secondary | ICD-10-CM

## 2015-12-31 DIAGNOSIS — K922 Gastrointestinal hemorrhage, unspecified: Secondary | ICD-10-CM | POA: Diagnosis present

## 2015-12-31 DIAGNOSIS — F17210 Nicotine dependence, cigarettes, uncomplicated: Secondary | ICD-10-CM | POA: Diagnosis present

## 2015-12-31 DIAGNOSIS — Z833 Family history of diabetes mellitus: Secondary | ICD-10-CM

## 2015-12-31 DIAGNOSIS — Q2733 Arteriovenous malformation of digestive system vessel: Secondary | ICD-10-CM | POA: Diagnosis not present

## 2015-12-31 DIAGNOSIS — K621 Rectal polyp: Secondary | ICD-10-CM | POA: Diagnosis present

## 2015-12-31 DIAGNOSIS — K635 Polyp of colon: Secondary | ICD-10-CM | POA: Diagnosis not present

## 2015-12-31 DIAGNOSIS — R0989 Other specified symptoms and signs involving the circulatory and respiratory systems: Secondary | ICD-10-CM | POA: Diagnosis not present

## 2015-12-31 DIAGNOSIS — E785 Hyperlipidemia, unspecified: Secondary | ICD-10-CM | POA: Diagnosis present

## 2015-12-31 DIAGNOSIS — K5730 Diverticulosis of large intestine without perforation or abscess without bleeding: Secondary | ICD-10-CM

## 2015-12-31 DIAGNOSIS — Z720 Tobacco use: Secondary | ICD-10-CM | POA: Diagnosis present

## 2015-12-31 DIAGNOSIS — K639 Disease of intestine, unspecified: Secondary | ICD-10-CM

## 2015-12-31 DIAGNOSIS — T380X5A Adverse effect of glucocorticoids and synthetic analogues, initial encounter: Secondary | ICD-10-CM | POA: Diagnosis present

## 2015-12-31 DIAGNOSIS — K449 Diaphragmatic hernia without obstruction or gangrene: Secondary | ICD-10-CM | POA: Diagnosis present

## 2015-12-31 DIAGNOSIS — D649 Anemia, unspecified: Secondary | ICD-10-CM

## 2015-12-31 DIAGNOSIS — D122 Benign neoplasm of ascending colon: Secondary | ICD-10-CM | POA: Diagnosis present

## 2015-12-31 DIAGNOSIS — K5790 Diverticulosis of intestine, part unspecified, without perforation or abscess without bleeding: Secondary | ICD-10-CM | POA: Diagnosis present

## 2015-12-31 DIAGNOSIS — I739 Peripheral vascular disease, unspecified: Secondary | ICD-10-CM

## 2015-12-31 DIAGNOSIS — L03317 Cellulitis of buttock: Secondary | ICD-10-CM | POA: Diagnosis not present

## 2015-12-31 HISTORY — DX: Chronic or unspecified gastric ulcer with hemorrhage: K25.4

## 2015-12-31 HISTORY — DX: Angiodysplasia of colon with hemorrhage: K55.21

## 2015-12-31 HISTORY — DX: Duodenal ulcer, unspecified as acute or chronic, without hemorrhage or perforation: K26.9

## 2015-12-31 HISTORY — DX: Polyp of colon: K63.5

## 2015-12-31 LAB — COMPREHENSIVE METABOLIC PANEL
ALBUMIN: 3.3 g/dL — AB (ref 3.5–5.0)
ALT: 27 U/L (ref 17–63)
AST: 62 U/L — AB (ref 15–41)
Alkaline Phosphatase: 58 U/L (ref 38–126)
Anion gap: 8 (ref 5–15)
BUN: 40 mg/dL — ABNORMAL HIGH (ref 6–20)
CO2: 32 mmol/L (ref 22–32)
Calcium: 9.6 mg/dL (ref 8.9–10.3)
Chloride: 97 mmol/L — ABNORMAL LOW (ref 101–111)
Creatinine, Ser: 1.54 mg/dL — ABNORMAL HIGH (ref 0.61–1.24)
GFR calc Af Amer: 49 mL/min — ABNORMAL LOW (ref >60)
GFR calc non Af Amer: 42 mL/min — ABNORMAL LOW (ref >60)
GLUCOSE: 133 mg/dL — AB (ref 65–99)
POTASSIUM: 3.8 mmol/L (ref 3.5–5.1)
Sodium: 137 mmol/L (ref 135–145)
Total Bilirubin: 0.7 mg/dL (ref 0.3–1.2)
Total Protein: 6.2 g/dL — ABNORMAL LOW (ref 6.5–8.1)

## 2015-12-31 LAB — CBC WITH DIFFERENTIAL/PLATELET
Band Neutrophils: 0 %
Basophils Absolute: 0 10*3/uL (ref 0.0–0.1)
Basophils Relative: 0 %
Blasts: 0 %
Eosinophils Absolute: 0.2 10*3/uL (ref 0.0–0.7)
Eosinophils Relative: 1 %
HCT: 29.4 % — ABNORMAL LOW (ref 39.0–52.0)
Hemoglobin: 10.1 g/dL — ABNORMAL LOW (ref 13.0–17.0)
LYMPHS ABS: 2.4 10*3/uL (ref 0.7–4.0)
Lymphocytes Relative: 12 %
MCH: 30.7 pg (ref 26.0–34.0)
MCHC: 34.4 g/dL (ref 30.0–36.0)
MCV: 89.4 fL (ref 78.0–100.0)
MONOS PCT: 6 %
MYELOCYTES: 0 %
Metamyelocytes Relative: 0 %
Monocytes Absolute: 1.2 10*3/uL — ABNORMAL HIGH (ref 0.1–1.0)
NEUTROS ABS: 15.9 10*3/uL — AB (ref 1.7–7.7)
NEUTROS PCT: 81 %
NRBC: 0 /100{WBCs}
Other: 0 %
PLATELETS: 314 10*3/uL (ref 150–400)
Promyelocytes Absolute: 0 %
RBC: 3.29 MIL/uL — ABNORMAL LOW (ref 4.22–5.81)
RDW: 13.3 % (ref 11.5–15.5)
WBC: 19.7 10*3/uL — AB (ref 4.0–10.5)

## 2015-12-31 LAB — CBC
HEMATOCRIT: 22.1 % — AB (ref 39.0–52.0)
Hemoglobin: 7.7 g/dL — ABNORMAL LOW (ref 13.0–17.0)
MCH: 31.2 pg (ref 26.0–34.0)
MCHC: 34.8 g/dL (ref 30.0–36.0)
MCV: 89.5 fL (ref 78.0–100.0)
Platelets: 209 10*3/uL (ref 150–400)
RBC: 2.47 MIL/uL — ABNORMAL LOW (ref 4.22–5.81)
RDW: 13.4 % (ref 11.5–15.5)
WBC: 13.4 10*3/uL — ABNORMAL HIGH (ref 4.0–10.5)

## 2015-12-31 LAB — GLUCOSE, CAPILLARY: GLUCOSE-CAPILLARY: 126 mg/dL — AB (ref 65–99)

## 2015-12-31 LAB — MRSA PCR SCREENING: MRSA BY PCR: NEGATIVE

## 2015-12-31 LAB — PROTIME-INR
INR: 1.08 (ref 0.00–1.49)
Prothrombin Time: 14.2 seconds (ref 11.6–15.2)

## 2015-12-31 LAB — PREPARE RBC (CROSSMATCH)

## 2015-12-31 MED ORDER — INSULIN ASPART 100 UNIT/ML ~~LOC~~ SOLN: 0.0000 [IU] | Freq: Every day | SUBCUTANEOUS | Status: DC

## 2015-12-31 MED ORDER — IPRATROPIUM-ALBUTEROL 0.5-2.5 (3) MG/3ML IN SOLN
3.0000 mL | Freq: Four times a day (QID) | RESPIRATORY_TRACT | Status: DC
  Administered 2015-12-31 – 2016-01-03 (×12): 3 mL via RESPIRATORY_TRACT
  Filled 2015-12-31 (×13): qty 3

## 2015-12-31 MED ORDER — CYCLOBENZAPRINE HCL 10 MG PO TABS: 10.0000 mg | ORAL_TABLET | Freq: Three times a day (TID) | ORAL | Status: DC | PRN

## 2015-12-31 MED ORDER — ONDANSETRON HCL 4 MG/2ML IJ SOLN: 4.0000 mg | Freq: Four times a day (QID) | INTRAMUSCULAR | Status: DC | PRN

## 2015-12-31 MED ORDER — INFLUENZA VAC SPLIT QUAD 0.5 ML IM SUSY
0.5000 mL | PREFILLED_SYRINGE | INTRAMUSCULAR | Status: AC
  Administered 2016-01-01: 0.5 mL via INTRAMUSCULAR
  Filled 2015-12-31: qty 0.5

## 2015-12-31 MED ORDER — ONDANSETRON HCL 4 MG PO TABS: 4.0000 mg | ORAL_TABLET | Freq: Four times a day (QID) | ORAL | Status: DC | PRN

## 2015-12-31 MED ORDER — INSULIN ASPART 100 UNIT/ML ~~LOC~~ SOLN
0.0000 [IU] | Freq: Three times a day (TID) | SUBCUTANEOUS | Status: DC
Start: 2016-01-01 — End: 2016-01-05

## 2015-12-31 MED ORDER — SODIUM CHLORIDE 0.9 % IV BOLUS (SEPSIS)
500.0000 mL | Freq: Once | INTRAVENOUS | Status: AC
  Administered 2015-12-31: 500 mL via INTRAVENOUS

## 2015-12-31 MED ORDER — PANTOPRAZOLE SODIUM 40 MG IV SOLR
INTRAVENOUS | Status: AC
  Filled 2015-12-31: qty 80

## 2015-12-31 MED ORDER — GUAIFENESIN ER 600 MG PO TB12
600.0000 mg | ORAL_TABLET | Freq: Two times a day (BID) | ORAL | Status: DC
  Administered 2015-12-31 – 2016-01-05 (×10): 600 mg via ORAL
  Filled 2015-12-31 (×10): qty 1

## 2015-12-31 MED ORDER — ALBUTEROL SULFATE (2.5 MG/3ML) 0.083% IN NEBU
2.5000 mg | INHALATION_SOLUTION | Freq: Once | RESPIRATORY_TRACT | Status: AC
  Administered 2015-12-31: 2.5 mg via RESPIRATORY_TRACT
  Filled 2015-12-31: qty 3

## 2015-12-31 MED ORDER — TAMSULOSIN HCL 0.4 MG PO CAPS
0.4000 mg | ORAL_CAPSULE | Freq: Every day | ORAL | Status: DC
  Administered 2016-01-01 – 2016-01-05 (×5): 0.4 mg via ORAL
  Filled 2015-12-31 (×5): qty 1

## 2015-12-31 MED ORDER — METFORMIN HCL 500 MG PO TABS
500.0000 mg | ORAL_TABLET | Freq: Every day | ORAL | Status: DC
  Administered 2016-01-02 – 2016-01-05 (×4): 500 mg via ORAL
  Filled 2015-12-31 (×4): qty 1

## 2015-12-31 MED ORDER — ACETAMINOPHEN 325 MG PO TABS
650.0000 mg | ORAL_TABLET | Freq: Four times a day (QID) | ORAL | Status: DC | PRN
  Administered 2015-12-31: 650 mg via ORAL
  Filled 2015-12-31: qty 2

## 2015-12-31 MED ORDER — ALBUTEROL SULFATE (2.5 MG/3ML) 0.083% IN NEBU: 2.5000 mg | INHALATION_SOLUTION | RESPIRATORY_TRACT | Status: DC | PRN

## 2015-12-31 MED ORDER — ALUM & MAG HYDROXIDE-SIMETH 200-200-20 MG/5ML PO SUSP: 30.0000 mL | Freq: Four times a day (QID) | ORAL | Status: DC | PRN

## 2015-12-31 MED ORDER — ACETAMINOPHEN 650 MG RE SUPP: 650.0000 mg | Freq: Four times a day (QID) | RECTAL | Status: DC | PRN

## 2015-12-31 MED ORDER — PANTOPRAZOLE SODIUM 40 MG IV SOLR
40.0000 mg | Freq: Once | INTRAVENOUS | Status: AC
  Administered 2015-12-31: 40 mg via INTRAVENOUS
  Filled 2015-12-31: qty 40

## 2015-12-31 MED ORDER — IPRATROPIUM BROMIDE 0.02 % IN SOLN: 0.5000 mg | Freq: Four times a day (QID) | RESPIRATORY_TRACT | Status: DC

## 2015-12-31 MED ORDER — PANTOPRAZOLE SODIUM 40 MG IV SOLR
40.0000 mg | Freq: Two times a day (BID) | INTRAVENOUS | Status: DC
  Administered 2016-01-04 – 2016-01-05 (×3): 40 mg via INTRAVENOUS
  Filled 2015-12-31 (×3): qty 40

## 2015-12-31 MED ORDER — ALBUTEROL SULFATE (2.5 MG/3ML) 0.083% IN NEBU: 2.5000 mg | INHALATION_SOLUTION | Freq: Four times a day (QID) | RESPIRATORY_TRACT | Status: DC

## 2015-12-31 MED ORDER — SODIUM CHLORIDE 0.9 % IV SOLN
Freq: Once | INTRAVENOUS | Status: AC
  Administered 2015-12-31: 20 mL/h via INTRAVENOUS

## 2015-12-31 MED ORDER — SIMVASTATIN 20 MG PO TABS
20.0000 mg | ORAL_TABLET | Freq: Every day | ORAL | Status: DC
  Administered 2016-01-01 – 2016-01-04 (×4): 20 mg via ORAL
  Filled 2015-12-31 (×5): qty 1

## 2015-12-31 MED ORDER — GUAIFENESIN-CODEINE 100-10 MG/5ML PO SOLN
5.0000 mL | ORAL | Status: DC | PRN
  Administered 2015-12-31: 5 mL via ORAL
  Filled 2015-12-31: qty 5

## 2015-12-31 MED ORDER — PANTOPRAZOLE SODIUM 40 MG IV SOLR
8.0000 mg/h | INTRAVENOUS | Status: AC
  Administered 2015-12-31 – 2016-01-03 (×5): 8 mg/h via INTRAVENOUS
  Filled 2015-12-31 (×8): qty 80

## 2015-12-31 MED ORDER — NICOTINE 21 MG/24HR TD PT24
21.0000 mg | MEDICATED_PATCH | Freq: Every day | TRANSDERMAL | Status: DC
  Administered 2015-12-31 – 2016-01-05 (×6): 21 mg via TRANSDERMAL
  Filled 2015-12-31 (×6): qty 1

## 2015-12-31 NOTE — H&P (Signed)
History and Physical  Todd George [id number] DOB: 09/04/1941 DOA: 12/31/2015  Referring physician: Dr Alvino Chapel, ED physician PCP: [name], MD   Chief Complaint: Congestion, bloody stool  HPI: Todd George is a 75 y.o. male  With a history of diabetes, hyperlipidemia, COPD, tobacco abuse, obstructive sleep apnea not on C Pap, hypertension. Patient comes in with 2 weeks of worsening chest congestion with cough. Cough is productive with whitish phlegm occasionally yellow mucus. This is accompanied by shortness of breath that is worse with activity and improved with rest. His symptoms have not been worsening, but have not been improving.  Additionally the patient started having bloody stools early this morning was preceded by some abdominal pain yesterday. The abdominal pain was vague and on the right side.  He admits to using naproxen twice a day for a couple of years. Denies melena, but states that the rectal bleeding was dark red. Denies alcohol use. He is a smoker, but has weaned down to about half a pack a day.   Review of Systems:   Pt denies any fevers, chills, nausea, vomiting, diarrhea, constipation, abdominal pain, shortness of breath, dyspnea on exertion, orthopnea, cough, wheezing, palpitations, headache, vision changes, lightheadedness, dizziness, diarrhea, constipation.  Review of systems are otherwise negative  Past Medical History  Diagnosis Date  . Hyperlipidemia   . Cellulitis   . BPH (benign prostatic hyperplasia)   . Chronic back pain   . Carotid artery disease ([place])   . COPD (chronic obstructive pulmonary disease) ([place])     2ppd  . OSA on CPAP     not used machine in over 10 yrs  . Diabetes mellitus without complication ([place])   . Hypertension    Past Surgical History  Procedure Laterality Date  . Replacement total knee Left   . Cholecystectomy    . Joint replacement    . Throat surgery  1965  . Shoulder arthroscopy with subacromial  decompression Right 05/28/2015    Procedure: SHOULDER ARTHROSCOPY WITH SUBACROMIAL DECOMPRESSION;  Surgeon: Melrose Nakayama, MD;  Location: [facility];  Service: Orthopedics;  Laterality: Right;  . Shoulder arthroscopy with distal clavicle resection Right 05/28/2015    Procedure: SHOULDER ARTHROSCOPY WITH DISTAL CLAVICLE RESECTION;  Surgeon: Melrose Nakayama, MD;  Location: [place];  Service: Orthopedics;  Laterality: Right;   Social History:  reports that he has been smoking.  He does not have any smokeless tobacco history on file. He reports that he drinks alcohol. He reports that he does not use illicit drugs. Patient lives at home & is able to participate in activities of daily living  No Known Allergies  Family history of DM  Prior to Admission medications   Medication Sig Start Date End Date Taking? Authorizing Provider  cyclobenzaprine (FLEXERIL) 10 MG tablet Take 10 mg by mouth 3 (three) times daily as needed for muscle spasms.   Yes Historical Provider, MD  docusate sodium (COLACE) 100 MG capsule Take 100 mg by mouth 2 (two) times daily.   Yes Historical Provider, MD  HYDROcodone-acetaminophen (NORCO) 5-325 MG per tablet Take 1-2 tablets by mouth every 6 (six) hours as needed for moderate pain or severe pain. 05/28/15  Yes Loni Dolly, PA-C  lisinopril-hydrochlorothiazide (PRINZIDE,ZESTORETIC) 20-12.5 MG tablet Take 1 tablet by mouth daily.   Yes Historical Provider, MD  metFORMIN (GLUCOPHAGE) 500 MG tablet Take 500 mg by mouth daily.    Yes Historical Provider, MD  metoprolol succinate (TOPROL-XL) 25 MG 24 hr  tablet Take 25 mg by mouth daily.   Yes Historical Provider, MD  Multiple Vitamins-Minerals (PRESERVISION AREDS 2) CAPS Take 2 capsules by mouth daily.   Yes Historical Provider, MD  simvastatin (ZOCOR) 20 MG tablet Take 20 mg by mouth daily.   Yes Historical Provider, MD  tamsulosin (FLOMAX) 0.4 MG CAPS capsule Take 0.4 mg by mouth daily.    Yes  Historical Provider, MD    Physical Exam: BP 104/46 mmHg  Pulse 75  Temp(Src) 97.8 F (36.6 C) (Oral)  Resp 25  Ht 5\' 10"  (1.778 m)  Wt 102.059 kg (225 lb)  BMI 32.28 kg/m2  SpO2 90%  General: Elderly Caucasian male. Awake and alert and oriented x3. No acute cardiopulmonary distress.  Eyes: Pupils equal, round, reactive to light. Extraocular muscles are intact. Sclerae anicteric and noninjected.  ENT: Moist mucosal membranes. No mucosal lesions.   Neck: Neck supple without lymphadenopathy. No carotid bruits. No masses palpated.  Cardiovascular: Regular rate with normal S1-S2 sounds. No murmurs, rubs, gallops auscultated. No JVD.  Respiratory: Poor respiratory effort. Prolonged expiration phase. No wheezes, rales, rhonchi on auscultation.  Abdomen: Soft, nontender, nondistended. Active bowel sounds. No masses or hepatosplenomegaly  Skin: Dry, warm to touch. 2+ dorsalis pedis and radial pulses. Musculoskeletal: No calf or leg pain. All major joints not erythematous nontender.  Psychiatric: Intact judgment and insight.  Neurologic: No focal neurological deficits. Cranial nerves II through XII are grossly intact.           Labs on Admission:  Basic Metabolic Panel:  Recent Labs Lab 12/31/15 1550  NA 137  K 3.8  CL 97*  CO2 32  GLUCOSE 133*  BUN 40*  CREATININE 1.54*  CALCIUM 9.6   Liver Function Tests:  Recent Labs Lab 12/31/15 1550  AST 62*  ALT 27  ALKPHOS 58  BILITOT 0.7  PROT 6.2*  ALBUMIN 3.3*   No results for input(s): LIPASE, AMYLASE in the last 168 hours. No results for input(s): AMMONIA in the last 168 hours. CBC:  Recent Labs Lab 12/31/15 1550  WBC 19.7*  NEUTROABS PENDING  HGB 10.1*  HCT 29.4*  MCV 89.4  PLT 314   Cardiac Enzymes: No results for input(s): CKTOTAL, CKMB, CKMBINDEX, TROPONINI in the last 168 hours.  BNP (last 3 results) No results for input(s): BNP in the last 8760 hours.  ProBNP (last 3 results) No results for  input(s): PROBNP in the last 8760 hours.  CBG: No results for input(s): GLUCAP in the last 168 hours.  Radiological Exams on Admission: Dg Chest Portable 1 View  12/31/2015  CLINICAL DATA:  Cough, congestion EXAM: PORTABLE CHEST 1 VIEW COMPARISON:  None. FINDINGS: Cardiomegaly with vascular congestion. Minimal by the basilar opacities, likely atelectasis. No overt edema or effusions. IMPRESSION: Cardiomegaly with vascular congestion and bibasilar atelectasis. Electronically Signed   By: Rolm Baptise M.D.   On: 12/31/2015 16:31    EKG: Independently reviewed. Sinus rhythm with heart rate of 98. Normal intervals. No acute ST elevation or depression.  Assessment/Plan Present on Admission:  . GI bleed . Chest congestion . Hypertension  This patient was discussed with the ED physician, including pertinent vitals, physical exam findings, labs, and imaging.  We also discussed care given by the ED provider.  #1 GI bleed  Admit to stepdown  Transfuse 1 unit  Protonix - will give an additional 40 mg bolus then continuous drip.  Check CBC at 8:00 tonight and tomorrow morning  GI to consult  Clear  liquid diet #2 chronic chest congestion  No evidence of pneumonia. However, the patient does have vascular congestion on chest x-ray, which may be contributing to the patient's shortness of breath. He does not have any history of congestive heart failure. Will utilize fluids cautiously  Mucinex  We'll give nebulizer treatment to see if this improves patient's respiratory status #3 hypertension  Currently hypotensive  Hold antihypertensives  Bolus IV fluids 500 mL #4 diabetes type 2  Metformin  Sliding-scale insulin  DVT prophylaxis: SCDs  Consultants: Gastroenterology  Code Status: Full Code  Family Communication: None   Disposition Plan: Admit to stepdown   Truett Mainland, DO [facility] Pager [phone]

## 2015-12-31 NOTE — ED Notes (Signed)
Pt thinks he may have pneumonia, pt sob since around Christmas.  Pt also has rectal bleeding since this morning dark in color.  Pt has diarrhea.  Pt alert and oriented.

## 2015-12-31 NOTE — Consult Note (Signed)
Referring Provider: No ref. provider found Primary Care Physician:  [name], MD Primary Gastroenterologist:  Dr. [name]  Reason for Consultation:  GI bleed.   HPI: Pleasant 75 year old gentleman with a two-week history of cough and congestion. This morning noted dark red bloody BM. He came to the emergency room and was seen by Dr. Alvino Chapel.  He was initially found be somewhat hypotensive in the 80-90 range. He had dark red blood on DRE by EDP. White count elevated to 19,000 hemoglobin 10.1. INR normal.  Chest x-ray demonstrated cardiomegaly with mild vascular congestion and bibasilar atelectasis.  Patient readily admits to taking naproxen 500 mg twice daily. He denies any form of other nonsteroidal agents. No prior history of GI bleeding. He has not had any hematemesis. Denies chronic abdominal pain, GERD, odynophagia or dysphagia. He reports he had a colonoscopy 2-3 years ago in [place] and was found to have a couple of polyps and was told return in 5 years for follow-up examination.  He states he previously consumed alcohol on a regular basis many years ago and now rarely has an alcoholic drink.  Past Medical History  Diagnosis Date  . Hyperlipidemia   . Cellulitis   . BPH (benign prostatic hyperplasia)   . Chronic back pain   . Carotid artery disease ([place])   . COPD (chronic obstructive pulmonary disease) ([place])     2ppd  . OSA on CPAP     not used machine in over 10 yrs  . Diabetes mellitus without complication ([place])   . Hypertension     Past Surgical History  Procedure Laterality Date  . Replacement total knee Left   . Cholecystectomy    . Joint replacement    . Throat surgery  1965  . Shoulder arthroscopy with subacromial decompression Right 05/28/2015    Procedure: SHOULDER ARTHROSCOPY WITH SUBACROMIAL DECOMPRESSION;  Surgeon: Melrose Nakayama, MD;  Location: [facility];  Service: Orthopedics;  Laterality: Right;  . Shoulder arthroscopy with distal  clavicle resection Right 05/28/2015    Procedure: SHOULDER ARTHROSCOPY WITH DISTAL CLAVICLE RESECTION;  Surgeon: Melrose Nakayama, MD;  Location: [facility];  Service: Orthopedics;  Laterality: Right;    Prior to Admission medications   Medication Sig Start Date End Date Taking? Authorizing Provider  cyclobenzaprine (FLEXERIL) 10 MG tablet Take 10 mg by mouth 3 (three) times daily as needed for muscle spasms.   Yes Historical Provider, MD  docusate sodium (COLACE) 100 MG capsule Take 100 mg by mouth 2 (two) times daily.   Yes Historical Provider, MD  HYDROcodone-acetaminophen (NORCO) 5-325 MG per tablet Take 1-2 tablets by mouth every 6 (six) hours as needed for moderate pain or severe pain. 05/28/15  Yes Loni Dolly, PA-C  lisinopril-hydrochlorothiazide (PRINZIDE,ZESTORETIC) 20-12.5 MG tablet Take 1 tablet by mouth daily.   Yes Historical Provider, MD  metFORMIN (GLUCOPHAGE) 500 MG tablet Take 500 mg by mouth daily.    Yes Historical Provider, MD  metoprolol succinate (TOPROL-XL) 25 MG 24 hr tablet Take 25 mg by mouth daily.   Yes Historical Provider, MD  Multiple Vitamins-Minerals (PRESERVISION AREDS 2) CAPS Take 2 capsules by mouth daily.   Yes Historical Provider, MD  simvastatin (ZOCOR) 20 MG tablet Take 20 mg by mouth daily.   Yes Historical Provider, MD  tamsulosin (FLOMAX) 0.4 MG CAPS capsule Take 0.4 mg by mouth daily.    Yes Historical Provider, MD    Current Facility-Administered Medications  Medication Dose Route Frequency Provider Last Rate  Last Dose  . 0.9 %  sodium chloride infusion   Intravenous Once Davonna Belling, MD      . pantoprazole (PROTONIX) 80 mg in sodium chloride 0.9 % 250 mL (0.32 mg/mL) infusion  8 mg/hr Intravenous Continuous Tanna Savoy Stinson, DO      . [START ON 01/04/2016] pantoprazole (PROTONIX) injection 40 mg  40 mg Intravenous Q12H Tanna Savoy Stinson, DO        Allergies as of 12/31/2015  . (No Known Allergies)   Patient is originally from [place].  He moved here from [place] a couple years ago to be close to his ailing brother with Parkinson's disease. He is divorced. He has 2 grown sons. He formally drank alcohol on a regular basis many years but rarely now. No tobacco. History reviewed. No pertinent family history.  Social History   Social History  . Marital Status: Divorced    Spouse Name: N/A  . Number of Children: N/A  . Years of Education: N/A   Occupational History  . Not on file.   Social History Main Topics  . Smoking status: Current Every Day Smoker -- 2.00 packs/day  . Smokeless tobacco: Not on file  . Alcohol Use: Yes     Comment: social  . Drug Use: No  . Sexual Activity: No   Other Topics Concern  . Not on file   Social History Narrative    Review of Systems: Gen: Denies any fever, chills, sweats, anorexia, fatigue, weakness, malaise, weight loss, and sleep disorder CV: Denies chest pain, , peripheral edema, and claudication. Resp: As in history of present illness. GI: Denies vomiting blood, jaundice, and fecal incontinence.   Denies dysphagia or odynophagia. Derm: Denies rash, itching, dry skin, hives, moles, warts, or unhealing ulcers.  Psych: Denies depression, anxiety, memory loss, suicidal ideation, hallucinations, paranoia, and confusion. Heme: Denies bruising, bleeding, and enlarged lymph nodes.   Physical Exam: Vital signs in last 24 hours: Temp:  [97.8 F (36.6 C)] 97.8 F (36.6 C) (01/06 1538) Pulse Rate:  [66-108] 66 (01/06 1800) Resp:  [16-25] 17 (01/06 1730) BP: (72-121)/(39-65) 101/51 mmHg (01/06 1800) SpO2:  [90 %-100 %] 100 % (01/06 1800) Weight:  [225 lb (102.059 kg)] 225 lb (102.059 kg) (01/06 1538)   General:   Somewhat disheveled appearing. He is alert conversant and in no acute distress  Head:  Normocephalic and atraumatic. Eyes:  Sclera clear, no icterus.   Conjunctiva pink. Lungs:  Clear throughout to auscultation.   No wheezes, crackles, or rhonchi. No acute  distress. Heart:  Regular rate and rhythm; no murmurs, clicks, rubs,  or gallops. Abdomen:  Nondistended. Positive bowel sounds soft and nontender without appreciable mass or organomegaly  Extremities:  Without clubbing or edema.  Intake/Output from previous day:   Intake/Output this shift:    Lab Results:  Recent Labs  12/31/15 1550  WBC 19.7*  HGB 10.1*  HCT 29.4*  PLT 314   BMET  Recent Labs  12/31/15 1550  NA 137  K 3.8  CL 97*  CO2 32  GLUCOSE 133*  BUN 40*  CREATININE 1.54*  CALCIUM 9.6   LFT  Recent Labs  12/31/15 1550  PROT 6.2*  ALBUMIN 3.3*  AST 62*  ALT 27  ALKPHOS 58  BILITOT 0.7   PT/INR  Recent Labs  12/31/15 1550  LABPROT 14.2  INR 1.08   Hepatitis Panel No results for input(s): HEPBSAG, HCVAB, HEPAIGM, HEPBIGM in the last 72 hours. C-Diff No results for input(s):  CDIFFTOX in the last 72 hours.  Studies/Results: Dg Chest Portable 1 View  12/31/2015  CLINICAL DATA:  Cough, congestion EXAM: PORTABLE CHEST 1 VIEW COMPARISON:  None. FINDINGS: Cardiomegaly with vascular congestion. Minimal by the basilar opacities, likely atelectasis. No overt edema or effusions. IMPRESSION: Cardiomegaly with vascular congestion and bibasilar atelectasis. Electronically Signed   By: Rolm Baptise M.D.   On: 12/31/2015 16:31    Impression: Very pleasant 75 year old gentleman admitted to the hospital this evening with GI bleeding. Ongoing therapy with naproxen. BUN elevated. Dark bloody stool on DRE by EDP. I suspect NSAID induced injury to his upper GI tract. He likely has a peptic ulcer. I doubt he has any significant underlying liver disease. Mildly elevated ALT which is nonspecific. He is currently hemodynamically stable in the ICU. He has not had a bowel movement since since this morning. Recent cough and congestion. No pneumonia on chest x-ray. Leukocytosis noted. He does not appear at all toxic.  Recommendations:  Agree with blood/fluid resuscitation.  Agree with IV PPI bolus and infusion. I've instructed the nursing staff to keep 2 units typed and screened at all times for now I have offered the patient an EGD with therapeutic intent tomorrow morning. The risks, benefits, limitations, alternatives and imponderables have been reviewed with the patient. Potential for esophageal dilation, biopsy, etc. have also been reviewed.  Questions have been answered.Patient's questions answered. He is agreeable.rties agreeable.    Notice:  This dictation was prepared with Dragon dictation along with smaller phrase technology. Any transcriptional errors that result from this process are unintentional and may not be corrected upon review.

## 2015-12-31 NOTE — ED Provider Notes (Addendum)
CSN: [id number]     Arrival date & time 12/31/15  1526 History   First MD Initiated Contact with Patient 12/31/15 1542     Chief Complaint  Patient presents with  . Hypotension  . Rectal Bleeding      Patient is a 75 y.o. male presenting with hematochezia. The history is provided by the patient.  Rectal Bleeding Associated symptoms: light-headedness   Associated symptoms: no abdominal pain and no fever    patient presents with cough. States had a few days. States it feels as if there is something tight around his throat. States he cannot handle anything pressing on his neck. States she's had a little sputum,. No fevers. He's been more shortness of breath the last couple days. Started today began of black foul-smelling stool. He also sets of blood with wiping states he has a history of hemorrhoids. No abdominal pain. On arrival to ER he had a pressure of 80 systolic and a mild tachycardia at 110. No other bleeding. States he does feel fatigued and weak  Past Medical History  Diagnosis Date  . Hyperlipidemia   . Cellulitis   . BPH (benign prostatic hyperplasia)   . Chronic back pain   . Carotid artery disease ([place])   . COPD (chronic obstructive pulmonary disease) ([place])     2ppd  . OSA on CPAP     not used machine in over 10 yrs  . Diabetes mellitus without complication ([place])   . Hypertension    Past Surgical History  Procedure Laterality Date  . Replacement total knee Left   . Cholecystectomy    . Joint replacement    . Throat surgery  1965  . Shoulder arthroscopy with subacromial decompression Right 05/28/2015    Procedure: SHOULDER ARTHROSCOPY WITH SUBACROMIAL DECOMPRESSION;  Surgeon: Melrose Nakayama, MD;  Location: [facility];  Service: Orthopedics;  Laterality: Right;  . Shoulder arthroscopy with distal clavicle resection Right 05/28/2015    Procedure: SHOULDER ARTHROSCOPY WITH DISTAL CLAVICLE RESECTION;  Surgeon: Melrose Nakayama, MD;  Location: [facility];  Service: Orthopedics;  Laterality: Right;   History reviewed. No pertinent family history. Social History  Substance Use Topics  . Smoking status: Current Every Day Smoker -- 2.00 packs/day  . Smokeless tobacco: None  . Alcohol Use: Yes     Comment: social    Review of Systems  Constitutional: Negative for fever and appetite change.  HENT: Positive for congestion and sore throat. Negative for drooling.   Respiratory: Positive for cough and choking.   Cardiovascular: Negative for chest pain.  Gastrointestinal: Positive for hematochezia and anal bleeding. Negative for abdominal pain.  Genitourinary: Negative for difficulty urinating.  Musculoskeletal: Negative for back pain.  Skin: Negative for pallor.  Neurological: Positive for light-headedness.  Hematological: Negative for adenopathy.      Allergies  Review of patient's allergies indicates no known allergies.  Home Medications   Prior to Admission medications   Medication Sig Start Date End Date Taking? Authorizing Provider  cyclobenzaprine (FLEXERIL) 10 MG tablet Take 10 mg by mouth 3 (three) times daily as needed for muscle spasms.   Yes Historical Provider, MD  docusate sodium (COLACE) 100 MG capsule Take 100 mg by mouth 2 (two) times daily.   Yes Historical Provider, MD  HYDROcodone-acetaminophen (NORCO) 5-325 MG per tablet Take 1-2 tablets by mouth every 6 (six) hours as needed for moderate pain or severe pain. 05/28/15  Yes Loni Dolly, PA-C  lisinopril-hydrochlorothiazide (PRINZIDE,ZESTORETIC) 20-12.5 MG  tablet Take 1 tablet by mouth daily.   Yes Historical Provider, MD  metFORMIN (GLUCOPHAGE) 500 MG tablet Take 500 mg by mouth daily.    Yes Historical Provider, MD  metoprolol succinate (TOPROL-XL) 25 MG 24 hr tablet Take 25 mg by mouth daily.   Yes Historical Provider, MD  Multiple Vitamins-Minerals (PRESERVISION AREDS 2) CAPS Take 2 capsules by mouth daily.   Yes Historical Provider, MD  simvastatin (ZOCOR) 20  MG tablet Take 20 mg by mouth daily.   Yes Historical Provider, MD  tamsulosin (FLOMAX) 0.4 MG CAPS capsule Take 0.4 mg by mouth daily.    Yes Historical Provider, MD   BP 99/50 mmHg  Pulse 82  Temp(Src) 99 F (37.2 C) (Oral)  Resp 18  Ht 5\' 10"  (1.778 m)  Wt 225 lb (102.059 kg)  BMI 32.28 kg/m2  SpO2 98% Physical Exam  Constitutional: He appears well-developed.  HENT:  Posterior pharyngeal erythema without exudate  Eyes: Pupils are equal, round, and reactive to light.  Neck: Neck supple.  Cardiovascular: Normal rate.   Pulmonary/Chest: Effort normal.  Few scattered wheezes  Abdominal: Soft. There is no tenderness.  Musculoskeletal: Normal range of motion.  Neurological: He is alert.  Skin: Skin is warm. There is pallor.    ED Course  Procedures (including critical care time) Labs Review Labs Reviewed  COMPREHENSIVE METABOLIC PANEL - Abnormal; Notable for the following:    Chloride 97 (*)    Glucose, Bld 133 (*)    BUN 40 (*)    Creatinine, Ser 1.54 (*)    Total Protein 6.2 (*)    Albumin 3.3 (*)    AST 62 (*)    GFR calc non Af Amer 42 (*)    GFR calc Af Amer 49 (*)    All other components within normal limits  CBC WITH DIFFERENTIAL/PLATELET - Abnormal; Notable for the following:    WBC 19.7 (*)    RBC 3.29 (*)    Hemoglobin 10.1 (*)    HCT 29.4 (*)    Neutro Abs 15.9 (*)    Monocytes Absolute 1.2 (*)    All other components within normal limits  CBC - Abnormal; Notable for the following:    WBC 13.4 (*)    RBC 2.47 (*)    Hemoglobin 7.7 (*)    HCT 22.1 (*)    All other components within normal limits  GLUCOSE, CAPILLARY - Abnormal; Notable for the following:    Glucose-Capillary 126 (*)    All other components within normal limits  MRSA PCR SCREENING  PROTIME-INR  CBC  TYPE AND SCREEN  PREPARE RBC (CROSSMATCH)  ABO/RH  SAMPLE TO BLOOD BANK    Imaging Review Dg Chest Portable 1 View  12/31/2015  CLINICAL DATA:  Cough, congestion EXAM: PORTABLE  CHEST 1 VIEW COMPARISON:  None. FINDINGS: Cardiomegaly with vascular congestion. Minimal by the basilar opacities, likely atelectasis. No overt edema or effusions. IMPRESSION: Cardiomegaly with vascular congestion and bibasilar atelectasis. Electronically Signed   By: Rolm Baptise M.D.   On: 12/31/2015 16:31   I have personally reviewed and evaluated these images and lab results as part of my medical decision-making.   EKG Interpretation   Date/Time:  Friday December 31 2015 15:45:53 EST Ventricular Rate:  98 PR Interval:  176 QRS Duration: 108 QT Interval:  353 QTC Calculation: 451 R Axis:   -25 Text Interpretation:  Sinus rhythm Borderline left axis deviation Low  voltage, precordial leads Consider anterior infarct Baseline wander  in  lead(s) V2 Confirmed by Alvino Chapel  MD, Ovid Curd [phone]) on 12/31/2015 4:19:33  PM      MDM   Final diagnoses:  Gastrointestinal hemorrhage, unspecified gastritis, unspecified gastrointestinal hemorrhage type  Anemia, unspecified anemia type  URI (upper respiratory infection)    Patient with GI bleeding. Has black foul-smelling stool but also has gross blood. With further questioning found out that he does take NSAIDs daily. He is hypotensive and has a mild anemia which will likely go down further. Also sent a cough that x-ray does not show infection. Discussed with GI who will see the patient in consult and will admit to internal medicine to a step down bed. Given 1 unit of blood.  CRITICAL CARE Performed by: Mackie Pai Total critical care time: 30 minutes Critical care time was exclusive of separately billable procedures and treating other patients. Critical care was necessary to treat or prevent imminent or life-threatening deterioration. Critical care was time spent personally by me on the following activities: development of treatment plan with patient and/or surrogate as well as nursing, discussions with consultants, evaluation of  patient's response to treatment, examination of patient, obtaining history from patient or surrogate, ordering and performing treatments and interventions, ordering and review of laboratory studies, ordering and review of radiographic studies, pulse oximetry and re-evaluation of patient's condition.      Davonna Belling, MD 12/31/15 2245  Davonna Belling, MD 12/31/15 2245

## 2015-12-31 NOTE — Progress Notes (Signed)
Patient has cough that appears chronic dry , he is a heavy smoker also takes linsopril which will cause cough also.

## 2016-01-01 ENCOUNTER — Encounter (HOSPITAL_COMMUNITY): Payer: Self-pay | Admitting: *Deleted

## 2016-01-01 ENCOUNTER — Encounter (HOSPITAL_COMMUNITY)
Admission: EM | Disposition: A | Discharge status: Home / Self Care | Payer: Self-pay | Source: Home / Self Care | Attending: Internal Medicine

## 2016-01-01 DIAGNOSIS — K3189 Other diseases of stomach and duodenum: Secondary | ICD-10-CM | POA: Diagnosis present

## 2016-01-01 DIAGNOSIS — Z720 Tobacco use: Secondary | ICD-10-CM

## 2016-01-01 DIAGNOSIS — K5521 Angiodysplasia of colon with hemorrhage: Secondary | ICD-10-CM | POA: Diagnosis not present

## 2016-01-01 DIAGNOSIS — G4733 Obstructive sleep apnea (adult) (pediatric): Secondary | ICD-10-CM

## 2016-01-01 DIAGNOSIS — E119 Type 2 diabetes mellitus without complications: Secondary | ICD-10-CM

## 2016-01-01 DIAGNOSIS — I10 Essential (primary) hypertension: Secondary | ICD-10-CM | POA: Diagnosis present

## 2016-01-01 DIAGNOSIS — D62 Acute posthemorrhagic anemia: Secondary | ICD-10-CM | POA: Diagnosis present

## 2016-01-01 DIAGNOSIS — J441 Chronic obstructive pulmonary disease with (acute) exacerbation: Secondary | ICD-10-CM | POA: Diagnosis present

## 2016-01-01 HISTORY — PX: ESOPHAGOGASTRODUODENOSCOPY: SHX5428

## 2016-01-01 LAB — CBC
HCT: 23.3 % — ABNORMAL LOW (ref 39.0–52.0)
HCT: 24.1 % — ABNORMAL LOW (ref 39.0–52.0)
HEMATOCRIT: 23.6 % — AB (ref 39.0–52.0)
HEMATOCRIT: 23.6 % — AB (ref 39.0–52.0)
HEMOGLOBIN: 8.3 g/dL — AB (ref 13.0–17.0)
HEMOGLOBIN: 8.4 g/dL — AB (ref 13.0–17.0)
HEMOGLOBIN: 8.2 g/dL — AB (ref 13.0–17.0)
Hemoglobin: 8.3 g/dL — ABNORMAL LOW (ref 13.0–17.0)
MCH: 30.9 pg (ref 26.0–34.0)
MCH: 31 pg (ref 26.0–34.0)
MCH: 30.9 pg (ref 26.0–34.0)
MCH: 30.6 pg (ref 26.0–34.0)
MCHC: 35.2 g/dL (ref 30.0–36.0)
MCHC: 35.6 g/dL (ref 30.0–36.0)
MCHC: 35.2 g/dL (ref 30.0–36.0)
MCHC: 34.4 g/dL (ref 30.0–36.0)
MCV: 87.7 fL (ref 78.0–100.0)
MCV: 87.1 fL (ref 78.0–100.0)
MCV: 87.9 fL (ref 78.0–100.0)
MCV: 88.9 fL (ref 78.0–100.0)
PLATELETS: 180 10*3/uL (ref 150–400)
PLATELETS: 231 10*3/uL (ref 150–400)
Platelets: 202 10*3/uL (ref 150–400)
Platelets: 199 10*3/uL (ref 150–400)
RBC: 2.69 MIL/uL — AB (ref 4.22–5.81)
RBC: 2.71 MIL/uL — AB (ref 4.22–5.81)
RBC: 2.65 MIL/uL — ABNORMAL LOW (ref 4.22–5.81)
RBC: 2.71 MIL/uL — AB (ref 4.22–5.81)
RDW: 14.6 % (ref 11.5–15.5)
RDW: 14.6 % (ref 11.5–15.5)
RDW: 14.5 % (ref 11.5–15.5)
RDW: 14.7 % (ref 11.5–15.5)
WBC: 12.2 10*3/uL — AB (ref 4.0–10.5)
WBC: 12.3 10*3/uL — ABNORMAL HIGH (ref 4.0–10.5)
WBC: 14.4 10*3/uL — ABNORMAL HIGH (ref 4.0–10.5)
WBC: 19 10*3/uL — AB (ref 4.0–10.5)

## 2016-01-01 LAB — INFLUENZA PANEL BY PCR (TYPE A & B)
H1N1 flu by pcr: NOT DETECTED
Influenza A By PCR: NEGATIVE
Influenza B By PCR: NEGATIVE

## 2016-01-01 LAB — GLUCOSE, CAPILLARY
GLUCOSE-CAPILLARY: 111 mg/dL — AB (ref 65–99)
GLUCOSE-CAPILLARY: 115 mg/dL — AB (ref 65–99)
GLUCOSE-CAPILLARY: 152 mg/dL — AB (ref 65–99)
Glucose-Capillary: 124 mg/dL — ABNORMAL HIGH (ref 65–99)

## 2016-01-01 LAB — ABO/RH: ABO/RH(D): B POS

## 2016-01-01 LAB — SAMPLE TO BLOOD BANK

## 2016-01-01 SURGERY — EGD (ESOPHAGOGASTRODUODENOSCOPY)
Anesthesia: Moderate Sedation

## 2016-01-01 MED ORDER — METHYLPREDNISOLONE SODIUM SUCC 125 MG IJ SOLR
60.0000 mg | Freq: Two times a day (BID) | INTRAMUSCULAR | Status: DC
  Administered 2016-01-01 (×2): 60 mg via INTRAVENOUS
  Filled 2016-01-01 (×2): qty 2

## 2016-01-01 MED ORDER — DEXTROSE 5 % IV SOLN
1.0000 g | INTRAVENOUS | Status: DC
  Administered 2016-01-01 – 2016-01-05 (×5): 1 g via INTRAVENOUS
  Filled 2016-01-01 (×6): qty 10

## 2016-01-01 MED ORDER — SODIUM CHLORIDE 0.9 % IV SOLN: INTRAVENOUS | Status: DC

## 2016-01-01 MED ORDER — ONDANSETRON HCL 4 MG/2ML IJ SOLN
INTRAMUSCULAR | Status: DC | PRN
  Administered 2016-01-01: 4 mg via INTRAVENOUS

## 2016-01-01 MED ORDER — BENZONATATE 100 MG PO CAPS
100.0000 mg | ORAL_CAPSULE | Freq: Three times a day (TID) | ORAL | Status: DC
  Administered 2016-01-01 – 2016-01-05 (×13): 100 mg via ORAL
  Filled 2016-01-01 (×14): qty 1

## 2016-01-01 MED ORDER — MEPERIDINE HCL 100 MG/ML IJ SOLN
INTRAMUSCULAR | Status: DC | PRN
  Administered 2016-01-01: 50 mg via INTRAVENOUS

## 2016-01-01 MED ORDER — HYDROCOD POLST-CPM POLST ER 10-8 MG/5ML PO SUER
5.0000 mL | Freq: Two times a day (BID) | ORAL | Status: DC
  Administered 2016-01-01 – 2016-01-05 (×9): 5 mL via ORAL
  Filled 2016-01-01 (×9): qty 5

## 2016-01-01 MED ORDER — SIMETHICONE 40 MG/0.6ML PO SUSP
ORAL | Status: DC | PRN
  Administered 2016-01-01: 12:00:00

## 2016-01-01 MED ORDER — INSULIN GLARGINE 100 UNIT/ML ~~LOC~~ SOLN
12.0000 [IU] | Freq: Every day | SUBCUTANEOUS | Status: DC
  Filled 2016-01-01 (×3): qty 0.12

## 2016-01-01 MED ORDER — MENTHOL 3 MG MT LOZG
LOZENGE | OROMUCOSAL | Status: AC
  Filled 2016-01-01: qty 9

## 2016-01-01 MED ORDER — LIDOCAINE VISCOUS 2 % MT SOLN
OROMUCOSAL | Status: AC
  Filled 2016-01-01: qty 15

## 2016-01-01 MED ORDER — MENTHOL 3 MG MT LOZG: 1.0000 | LOZENGE | OROMUCOSAL | Status: DC | PRN

## 2016-01-01 MED ORDER — DEXTROSE 5 % IV SOLN
500.0000 mg | INTRAVENOUS | Status: DC
  Administered 2016-01-01 – 2016-01-05 (×5): 500 mg via INTRAVENOUS
  Filled 2016-01-01 (×6): qty 500

## 2016-01-01 MED ORDER — POTASSIUM CHLORIDE IN NACL 20-0.9 MEQ/L-% IV SOLN
INTRAVENOUS | Status: DC
  Administered 2016-01-01 – 2016-01-05 (×3): via INTRAVENOUS

## 2016-01-01 MED ORDER — PANTOPRAZOLE SODIUM 40 MG IV SOLR
INTRAVENOUS | Status: AC
  Filled 2016-01-01: qty 80

## 2016-01-01 MED ORDER — MEPERIDINE HCL 100 MG/ML IJ SOLN
INTRAMUSCULAR | Status: AC
  Filled 2016-01-01: qty 2

## 2016-01-01 MED ORDER — ONDANSETRON HCL 4 MG/2ML IJ SOLN
INTRAMUSCULAR | Status: AC
  Filled 2016-01-01: qty 2

## 2016-01-01 MED ORDER — MIDAZOLAM HCL 5 MG/5ML IJ SOLN
INTRAMUSCULAR | Status: AC
  Filled 2016-01-01: qty 10

## 2016-01-01 MED ORDER — MIDAZOLAM HCL 5 MG/5ML IJ SOLN
INTRAMUSCULAR | Status: DC | PRN
  Administered 2016-01-01: 2 mg via INTRAVENOUS

## 2016-01-01 NOTE — Op Note (Signed)
[facility] [address] [place], [address]   ENDOSCOPY PROCEDURE REPORT  PATIENT: Todd George, Todd George  MR#: [id number] BIRTHDATE: 10-26-1941 , 10  yrs. old GENDER: male ENDOSCOPIST: R.  Garfield Cornea, MD FACP FACG REFERRED BY:  Marjean Donna, M.D. PROCEDURE DATE:  01/06/16 PROCEDURE:  EGD w/ biopsy INDICATIONS:  melena; declining hemoglobin. MEDICATIONS: Versed 2 mg IV and Demerol 50 mg IV in divided doses. Xylocaine gel orally.  Zofran 4 mg IV. ASA CLASS:      Class III  CONSENT: The risks, benefits, limitations, alternatives and imponderables have been discussed.  The potential for biopsy, esophogeal dilation, etc. have also been reviewed.  Questions have been answered.  All parties agreeable.  Please see the history and physical in the medical record for more information.  DESCRIPTION OF PROCEDURE: After the risks benefits and alternatives of the procedure were thoroughly explained, informed consent was obtained.  The    endoscope was introduced through the mouth and advanced to the second portion of the duodenum , limited by Without limitations. The instrument was slowly withdrawn as the mucosa was fully examined. Estimated blood loss is zero unless otherwise noted in this procedure report.    Normal-appearing tubular esophagus.  Stomach empty.  (No blood anywhere in the upper GI tract).  Small hiatal hernia.  Multiple linear antral erosions extending into the pylorus.  Did not identify a frank ulcer or infiltrating process.  Examination of bulb and second portion of the duodenum revealed additional erosions but no ulcer.  no discrete, suspicious lesion seen.  I did take small pinch biopsies of the gastric and duodenal mucosa. Retroflexed views revealed a hiatal hernia.     The scope was then withdrawn from the patient and the procedure completed.  COMPLICATIONS: There were no immediate complications. EBL 3 mL ENDOSCOPIC IMPRESSION: Hiatal hernia.  Multiple gastric and duodenal erosions?"I suspect NSAID insult. Status post biopsy. Patient could have more significant lesions related to NSAIDs further downstream  -  not excluded at this time. In addition, a right colon lesion is not excluded.  RECOMMENDATIONS: Clear liquid diet. Follow H&H. Avoid all NSAIDs. If bleeding tapers off, we will plan for an outpatient colonoscopy. If he displays evidence of ongoing bleeding, further evaluation will be warranted while he is here.  REPEAT EXAM:  eSigned:  R. Garfield Cornea, MD Rosalita Chessman [facility] 01/06/16 12:49 PM    CC:  CPT CODES: ICD CODES:  The ICD and CPT codes recommended by this software are interpretations from the data that the clinical staff has captured with the software.  The verification of the translation of this report to the ICD and CPT codes and modifiers is the sole responsibility of the health care institution and practicing physician where this report was generated.  [place]. will not be held responsible for the validity of the ICD and CPT codes included on this report.  AMA assumes no liability for data contained or not contained herein. CPT is a Designer, television/film set of the [employer].  PATIENT NAME:  Quinto, Mahlman MR#: [id number]

## 2016-01-01 NOTE — Progress Notes (Signed)
Blood bank was called about additional 2 units of PRBC. Blood bank holds currently one unit of PRBC, the second will be released as soon as the first one would be used.

## 2016-01-01 NOTE — Progress Notes (Addendum)
Patient still coughing  Ace inhibitor linked ??

## 2016-01-01 NOTE — Progress Notes (Signed)
[facility] PROGRESS NOTE  Todd George [id number] DOB: 02/15/1941 DOA: 12/31/2015 PCP: [name], MD    Code Status: Full code Family Communication: Discussed with patient; family not available Disposition Plan: Discharge with clinically appropriate, likely in the next 2-3 days.   Consultants:  Gastroenterology  Procedures:  Endoscopy pending  Antibiotics:  Rocephin 1/7>>  Azithromycin 1/7>>  HPI/Subjective: Patient says that he's been coughing all night. Has been an intermittent productive cough, but mostly nonproductive. Nursing confirms that he has had mostly nonstop coughing. He smokes 2 packs of cigarettes per day. Patient denies chest pain or pleurisy. He denies abdominal pain.  Objective: Filed Vitals:   01/01/16 0700 01/01/16 0800  BP: 97/82 127/75  Pulse: 74 72  Temp:    Resp: 26 16   temperature 98.6. Oxygen saturation 99% on room air.  Intake/Output Summary (Last 24 hours) at 01/01/16 0849 Last data filed at 01/01/16 0600  Gross per 24 hour  Intake    485 ml  Output   1290 ml  Net   -805 ml   Filed Weights   12/31/15 1538 01/01/16 0400  Weight: 102.059 kg (225 lb) 101.4 kg (223 lb 8.7 oz)    Exam:   General:  Pleasant alert 75 year old man in no acute distress.  Cardiovascular: S1, S2, no murmurs, rubs or gallops.  Respiratory: Multiple hacking cough; mild diffuse wheezes throughout both lung fields and occasional crackles. Breathing nonlabored at rest.  Abdomen: Obese, positive bowel sounds, soft, nontender, nondistended.  Musculoskeletal/extremities: No pedal edema. No acute hot red joints.  Neurologic: He is alert and oriented 3.   Data Reviewed: Basic Metabolic Panel:  Recent Labs Lab 12/31/15 1550  NA 137  K 3.8  CL 97*  CO2 32  GLUCOSE 133*  BUN 40*  CREATININE 1.54*  CALCIUM 9.6   Liver Function Tests:  Recent Labs Lab 12/31/15 1550  AST 62*  ALT 27  ALKPHOS 58  BILITOT 0.7  PROT 6.2*   ALBUMIN 3.3*   No results for input(s): LIPASE, AMYLASE in the last 168 hours. No results for input(s): AMMONIA in the last 168 hours. CBC:  Recent Labs Lab 12/31/15 1550 12/31/15 1952 01/01/16 0428  WBC 19.7* 13.4* 12.2*  NEUTROABS 15.9*  --   --   HGB 10.1* 7.7* 8.3*  HCT 29.4* 22.1* 23.6*  MCV 89.4 89.5 87.7  PLT 314 209 202   Cardiac Enzymes: No results for input(s): CKTOTAL, CKMB, CKMBINDEX, TROPONINI in the last 168 hours. BNP (last 3 results) No results for input(s): BNP in the last 8760 hours.  ProBNP (last 3 results) No results for input(s): PROBNP in the last 8760 hours.  CBG:  Recent Labs Lab 12/31/15 2114 01/01/16 0755  GLUCAP 126* 111*    Recent Results (from the past 240 hour(s))  MRSA PCR Screening     Status: None   Collection Time: 12/31/15  6:58 PM  Result Value Ref Range Status   MRSA by PCR NEGATIVE NEGATIVE Final    Comment:        The GeneXpert MRSA Assay (FDA approved for NASAL specimens only), is one component of a comprehensive MRSA colonization surveillance program. It is not intended to diagnose MRSA infection nor to guide or monitor treatment for MRSA infections.      Studies: Dg Chest Portable 1 View  12/31/2015  CLINICAL DATA:  Cough, congestion EXAM: PORTABLE CHEST 1 VIEW COMPARISON:  None. FINDINGS: Cardiomegaly with vascular congestion. Minimal by the basilar opacities, likely atelectasis.  No overt edema or effusions. IMPRESSION: Cardiomegaly with vascular congestion and bibasilar atelectasis. Electronically Signed   By: Rolm Baptise M.D.   On: 12/31/2015 16:31    Scheduled Meds: . azithromycin  500 mg Intravenous Q24H  . benzonatate  100 mg Oral TID  . cefTRIAXone (ROCEPHIN)  IV  1 g Intravenous Q24H  . chlorpheniramine-HYDROcodone  5 mL Oral Q12H  . guaiFENesin  600 mg Oral BID  . Influenza vac split quadrivalent PF  0.5 mL Intramuscular Tomorrow-1000  . insulin aspart  0-5 Units Subcutaneous QHS  . insulin aspart   0-9 Units Subcutaneous TID WC  . insulin glargine  12 Units Subcutaneous QHS  . ipratropium-albuterol  3 mL Nebulization Q6H  . metFORMIN  500 mg Oral Q breakfast  . methylPREDNISolone (SOLU-MEDROL) injection  60 mg Intravenous Q12H  . nicotine  21 mg Transdermal Daily  . [START ON 01/04/2016] pantoprazole (PROTONIX) IV  40 mg Intravenous Q12H  . simvastatin  20 mg Oral q1800  . tamsulosin  0.4 mg Oral Daily   Continuous Infusions: . 0.9 % NaCl with KCl 20 mEq / L    . pantoprozole (PROTONIX) infusion 8 mg/hr (01/01/16 0706)   Assessment and plan:  Principal Problem:   Chest congestion Active Problems:   COPD exacerbation (HCC)   GI bleed   Acute blood loss anemia   Diabetes mellitus without complication ([facility])   Tobacco abuse   Essential hypertension   OSA (obstructive sleep apnea)  1. COPD with exacerbation. Patient's white blood cell count was greater than 19,000 on admission, but he was afebrile. His chest x-ray on admission revealed cardiomegaly, vascular congestion, and bibasilar atelectasis. Given his tobacco use, age, and history of COPD, will start treatment for acute COPD with bronchitis. He has obstructive sleep apnea, but he is not compliant with CPAP. -We'll start Rocephin and azithromycin. Will start IV Solu-Medrol cautiously in the setting of GI bleeding. We'll continue DuoNeb every 6 hours. -For symptomatic relief of his cough, we'll continue guaifenesin, but will add Tessalon Perles 3 times a day and test next every 12 hours. -Add oxygen as needed for comfort and to keep his oxygen saturations greater than 90%. -We'll check an influenza panel.  Tobacco abuse. The patient smokes 2 packs of cigarettes per day. He was advised to stop smoking. Nicotine already patch ordered and will be continued.  GI bleed with associated acute blood loss anemia. The patient reported dark red bloody bowel movements prior to hospital admission. He had dark red blood on exam per the  EDP. He has a history of colon polyps per colonoscopy 2-3 years ago in [place]. -The patient's hemoglobin was 10.1 with a normal INR on admission. -He was started on a Protonix drip. -His hemoglobin fell to a nadir of 7.7, but increased to 8.3 this morning. His blood has been typed and screened. -We'll continue to monitor his CBC and if his hemoglobin drops more than 2 g, would favor transfusing packed red blood cells. -We'll add gentle IV fluids. -Gastroenterologist, Dr. [name] was consulted. He plans endoscopic evaluation.  Essential hypertension. The patient is treated chronically with metoprolol and lisinopril/HCTZ. They are being held secondary to his low-normal blood pressure. As above, will add gentle IV fluids.  Type 2 diabetes mellitus. The patient is treated chronically with metformin. Will continue it. Sliding scale NovoLog was added. -We'll also add Lantus as his CBGs are anticipated to increase on IV site Solu-Medrol. -We'll order hemoglobin A1c.   Time spent:  35 minutes    [facility] Pager [phone]. If 7PM-7AM, please contact night-coverage at [email or web address], password [facility] 01/01/2016, 8:49 AM  LOS: 1 day

## 2016-01-01 NOTE — Progress Notes (Signed)
Patient reports medium bloody stool this evening.

## 2016-01-02 ENCOUNTER — Encounter (HOSPITAL_COMMUNITY): Payer: Self-pay | Admitting: Internal Medicine

## 2016-01-02 DIAGNOSIS — K269 Duodenal ulcer, unspecified as acute or chronic, without hemorrhage or perforation: Secondary | ICD-10-CM

## 2016-01-02 DIAGNOSIS — K254 Chronic or unspecified gastric ulcer with hemorrhage: Secondary | ICD-10-CM

## 2016-01-02 HISTORY — DX: Duodenal ulcer, unspecified as acute or chronic, without hemorrhage or perforation: K26.9

## 2016-01-02 HISTORY — DX: Chronic or unspecified gastric ulcer with hemorrhage: K25.4

## 2016-01-02 LAB — GLUCOSE, CAPILLARY
GLUCOSE-CAPILLARY: 125 mg/dL — AB (ref 65–99)
GLUCOSE-CAPILLARY: 90 mg/dL (ref 65–99)
Glucose-Capillary: 136 mg/dL — ABNORMAL HIGH (ref 65–99)
Glucose-Capillary: 89 mg/dL (ref 65–99)

## 2016-01-02 LAB — CBC
HEMATOCRIT: 21.2 % — AB (ref 39.0–52.0)
HEMATOCRIT: 26.9 % — AB (ref 39.0–52.0)
HEMOGLOBIN: 7.4 g/dL — AB (ref 13.0–17.0)
HEMOGLOBIN: 9.3 g/dL — AB (ref 13.0–17.0)
MCH: 30.8 pg (ref 26.0–34.0)
MCH: 30.2 pg (ref 26.0–34.0)
MCHC: 34.9 g/dL (ref 30.0–36.0)
MCHC: 34.6 g/dL (ref 30.0–36.0)
MCV: 88.3 fL (ref 78.0–100.0)
MCV: 87.3 fL (ref 78.0–100.0)
Platelets: 202 10*3/uL (ref 150–400)
Platelets: 210 10*3/uL (ref 150–400)
RBC: 2.4 MIL/uL — AB (ref 4.22–5.81)
RBC: 3.08 MIL/uL — ABNORMAL LOW (ref 4.22–5.81)
RDW: 14.3 % (ref 11.5–15.5)
RDW: 15.3 % (ref 11.5–15.5)
WBC: 18.5 10*3/uL — ABNORMAL HIGH (ref 4.0–10.5)
WBC: 18.8 10*3/uL — ABNORMAL HIGH (ref 4.0–10.5)

## 2016-01-02 LAB — COMPREHENSIVE METABOLIC PANEL
ALK PHOS: 42 U/L (ref 38–126)
ALT: 26 U/L (ref 17–63)
AST: 55 U/L — ABNORMAL HIGH (ref 15–41)
Albumin: 2.5 g/dL — ABNORMAL LOW (ref 3.5–5.0)
Anion gap: 5 (ref 5–15)
BILIRUBIN TOTAL: 0.4 mg/dL (ref 0.3–1.2)
BUN: 29 mg/dL — ABNORMAL HIGH (ref 6–20)
CALCIUM: 8.5 mg/dL — AB (ref 8.9–10.3)
CO2: 25 mmol/L (ref 22–32)
CREATININE: 1.11 mg/dL (ref 0.61–1.24)
Chloride: 107 mmol/L (ref 101–111)
GFR calc non Af Amer: >60 mL/min (ref >60)
GLUCOSE: 169 mg/dL — AB (ref 65–99)
Potassium: 4.9 mmol/L (ref 3.5–5.1)
SODIUM: 137 mmol/L (ref 135–145)
TOTAL PROTEIN: 4.9 g/dL — AB (ref 6.5–8.1)

## 2016-01-02 LAB — PREPARE RBC (CROSSMATCH)

## 2016-01-02 MED ORDER — PEG 3350-KCL-NA BICARB-NACL 420 G PO SOLR
ORAL | Status: AC
  Filled 2016-01-02: qty 4000

## 2016-01-02 MED ORDER — FLEET ENEMA 7-19 GM/118ML RE ENEM
1.0000 | ENEMA | Freq: Two times a day (BID) | RECTAL | Status: DC
  Administered 2016-01-03 (×2): 1 via RECTAL

## 2016-01-02 MED ORDER — FUROSEMIDE 10 MG/ML IJ SOLN
10.0000 mg | Freq: Once | INTRAMUSCULAR | Status: AC
  Administered 2016-01-02: 10 mg via INTRAVENOUS
  Filled 2016-01-02: qty 2

## 2016-01-02 MED ORDER — SODIUM CHLORIDE 0.9 % IV SOLN: Freq: Once | INTRAVENOUS | Status: AC

## 2016-01-02 MED ORDER — PEG 3350-KCL-NA BICARB-NACL 420 G PO SOLR: 4000.0000 mL | Freq: Once | ORAL | Status: DC

## 2016-01-02 MED ORDER — PEG 3350-KCL-NABCB-NACL-NASULF 236 G PO SOLR
4000.0000 mL | Freq: Once | ORAL | Status: AC
  Administered 2016-01-02: 4000 mL via ORAL
  Filled 2016-01-02: qty 4000

## 2016-01-02 MED ORDER — PEG 3350-KCL-NABCB-NACL-NASULF 236 G PO SOLR: 4000.0000 mL | Freq: Once | ORAL | Status: DC

## 2016-01-02 NOTE — Progress Notes (Signed)
Patient remained stable overnight  although he had additional bloody stools overnight. Hemoglobin down to 7.4 this morning; second and third units of packed red blood cells ordered.  Vital signs in last 24 hours: Temp:  [97.1 F (36.2 C)-98.6 F (37 C)] 97.1 F (36.2 C) (01/08 1300) Pulse Rate:  [61-97] 71 (01/08 1300) Resp:  [14-30] 17 (01/08 1300) BP: (82-141)/(43-79) 131/61 mmHg (01/08 1300) SpO2:  [91 %-100 %] 100 % (01/08 1300) FiO2 (%):  [32 %] 32 % (01/07 1900) Weight:  [226 lb 10.1 oz (102.8 kg)] 226 lb 10.1 oz (102.8 kg) (01/08 0500) Last BM Date: 01/02/16 General:   Alert,, pleasant and cooperative in NAD Abdomen:  Nondistended. Positive bowel sounds soft and nontender Extremities:  Without clubbing or edema.    Intake/Output from previous day: 01/07 0701 - 01/08 0700 In: 1579.2 [I.V.:1279.2; IV Piggyback:300] Out: [id number] [Urine:1550] Intake/Output this shift: Total I/O In: 580 [I.V.:250; Blood:330] Out: -   Lab Results:  Recent Labs  01/01/16 1533 01/01/16 2123 01/02/16 0328  WBC 14.4* 19.0* 18.5*  HGB 8.2* 8.3* 7.4*  HCT 23.3* 24.1* 21.2*  PLT 180 231 202   BMET  Recent Labs  12/31/15 1550 01/02/16 0518  NA 137 137  K 3.8 4.9  CL 97* 107  CO2 32 25  GLUCOSE 133* 169*  BUN 40* 29*  CREATININE 1.54* 1.11  CALCIUM 9.6 8.5*   LFT  Recent Labs  01/02/16 0518  PROT 4.9*  ALBUMIN 2.5*  AST 55*  ALT 26  ALKPHOS 42  BILITOT 0.4   PT/INR  Recent Labs  12/31/15 1550  LABPROT 14.2  INR 1.08   Hepatitis Panel No results for input(s): HEPBSAG, HCVAB, HEPAIGM, HEPBIGM in the last 72 hours. C-Diff No results for input(s): CDIFFTOX in the last 72 hours.  Studies/Results: Dg Chest Portable 1 View  12/31/2015  CLINICAL DATA:  Cough, congestion EXAM: PORTABLE CHEST 1 VIEW COMPARISON:  None. FINDINGS: Cardiomegaly with vascular congestion. Minimal by the basilar opacities, likely atelectasis. No overt edema or effusions. IMPRESSION: Cardiomegaly  with vascular congestion and bibasilar atelectasis. Electronically Signed   By: Rolm Baptise M.D.   On: 12/31/2015 16:31    Assessment: Principal Problem:   Chest congestion Active Problems:   Diabetes mellitus without complication (HCC)   GI bleed   COPD exacerbation (HCC)   Tobacco abuse   Essential hypertension   OSA (obstructive sleep apnea)   Acute blood loss anemia   Mucosal abnormality of stomach   Gastric erosion with bleeding   Duodenal erosion Manus Rudd  01/02/2016, 1:52 PM  Impression:  Pleasant 75 year old gentleman admitted with  GI bleed. Evidence of ongoing blood loss. EGD findings yesterday insufficient to explain symptoms. Given this scenario, patient needs further evaluation. Leukocytosis noted. Abdominal examination benign.  Recommendations:   Diagnostic colonoscopy tomorrow.  The risks, benefits, limitations, alternatives and imponderables have been reviewed with the patient. Questions have been answered. All parties are agreeable.

## 2016-01-02 NOTE — Progress Notes (Signed)
p 

## 2016-01-02 NOTE — Progress Notes (Signed)
[facility] PROGRESS NOTE  Todd George [id number] DOB: 04/10/1941 DOA: 12/31/2015 PCP: [name], MD    Code Status: Full code Family Communication: Discussed with patient; family not available Disposition Plan: Discharge with clinically appropriate, likely in the next 2-3 days.   Consultants:  Gastroenterology, Dr. [name]  Procedures:  EGD 01/01/16: No blood anywhere in the upper GI tract; small hiatal hernia; multiple linear antral erosions extending into the pylorus; erosions seen in the bulb and second portion of the duodenum; no frank ulcers or infiltrating process seen; status post biopsy of the gastric and duodenal close.  Antibiotics:  Rocephin 1/7>>  Azithromycin 1/7>>  HPI/Subjective: Patient reports a large dark red stool this morning. He denies abdominal pain. He says that his cough is slightly less.  Objective: Filed Vitals:   01/02/16 0500 01/02/16 0730  BP: 110/58 109/51  Pulse: 71 73  Temp:    Resp: 19 22   temperature 98.0. Oxygen saturation 91 percent on room air.  Intake/Output Summary (Last 24 hours) at 01/02/16 0800 Last data filed at 01/02/16 0545  Gross per 24 hour  Intake 1330.83 ml  Output   1550 ml  Net -219.17 ml   Filed Weights   12/31/15 1538 01/01/16 0400 01/02/16 0500  Weight: 102.059 kg (225 lb) 101.4 kg (223 lb 8.7 oz) 102.8 kg (226 lb 10.1 oz)    Exam:   General:  Pleasant alert 75 year old man in no acute distress.  Cardiovascular: S1, S2, no murmurs, rubs or gallops.  Respiratory: Lungs with coarse breath sounds, but rare audible wheeze, significantly decreased from yesterday. He continues to have multiple bouts of coughing.  Abdomen: Obese, positive bowel sounds, soft, nontender, nondistended.  Musculoskeletal/extremities: No pedal edema. No acute hot red joints.  Neurologic: He is alert and oriented 3.   Data Reviewed: Basic Metabolic Panel:  Recent Labs Lab 12/31/15 1550 01/02/16 0518  NA  137 137  K 3.8 4.9  CL 97* 107  CO2 32 25  GLUCOSE 133* 169*  BUN 40* 29*  CREATININE 1.54* 1.11  CALCIUM 9.6 8.5*   Liver Function Tests:  Recent Labs Lab 12/31/15 1550 01/02/16 0518  AST 62* 55*  ALT 27 26  ALKPHOS 58 42  BILITOT 0.7 0.4  PROT 6.2* 4.9*  ALBUMIN 3.3* 2.5*   No results for input(s): LIPASE, AMYLASE in the last 168 hours. No results for input(s): AMMONIA in the last 168 hours. CBC:  Recent Labs Lab 12/31/15 1550  01/01/16 0428 01/01/16 0923 01/01/16 1533 01/01/16 2123 01/02/16 0328  WBC 19.7*  < > 12.2* 12.3* 14.4* 19.0* 18.5*  NEUTROABS 15.9*  --   --   --   --   --   --   HGB 10.1*  < > 8.3* 8.4* 8.2* 8.3* 7.4*  HCT 29.4*  < > 23.6* 23.6* 23.3* 24.1* 21.2*  MCV 89.4  < > 87.7 87.1 87.9 88.9 88.3  PLT 314  < > 202 199 180 231 202  < > = values in this interval not displayed. Cardiac Enzymes: No results for input(s): CKTOTAL, CKMB, CKMBINDEX, TROPONINI in the last 168 hours. BNP (last 3 results) No results for input(s): BNP in the last 8760 hours.  ProBNP (last 3 results) No results for input(s): PROBNP in the last 8760 hours.  CBG:  Recent Labs Lab 12/31/15 2114 01/01/16 0755 01/01/16 1135 01/01/16 1629 01/01/16 2108  GLUCAP 126* 111* 115* 152* 124*    Recent Results (from the past 240 hour(s))  MRSA  PCR Screening     Status: None   Collection Time: 12/31/15  6:58 PM  Result Value Ref Range Status   MRSA by PCR NEGATIVE NEGATIVE Final    Comment:        The GeneXpert MRSA Assay (FDA approved for NASAL specimens only), is one component of a comprehensive MRSA colonization surveillance program. It is not intended to diagnose MRSA infection nor to guide or monitor treatment for MRSA infections.      Studies: Dg Chest Portable 1 View  12/31/2015  CLINICAL DATA:  Cough, congestion EXAM: PORTABLE CHEST 1 VIEW COMPARISON:  None. FINDINGS: Cardiomegaly with vascular congestion. Minimal by the basilar opacities, likely  atelectasis. No overt edema or effusions. IMPRESSION: Cardiomegaly with vascular congestion and bibasilar atelectasis. Electronically Signed   By: Rolm Baptise M.D.   On: 12/31/2015 16:31    Scheduled Meds: . azithromycin  500 mg Intravenous Q24H  . benzonatate  100 mg Oral TID  . cefTRIAXone (ROCEPHIN)  IV  1 g Intravenous Q24H  . chlorpheniramine-HYDROcodone  5 mL Oral Q12H  . guaiFENesin  600 mg Oral BID  . insulin aspart  0-5 Units Subcutaneous QHS  . insulin aspart  0-9 Units Subcutaneous TID WC  . insulin glargine  12 Units Subcutaneous QHS  . ipratropium-albuterol  3 mL Nebulization Q6H  . metFORMIN  500 mg Oral Q breakfast  . methylPREDNISolone (SOLU-MEDROL) injection  60 mg Intravenous Q12H  . nicotine  21 mg Transdermal Daily  . [START ON 01/04/2016] pantoprazole (PROTONIX) IV  40 mg Intravenous Q12H  . simvastatin  20 mg Oral q1800  . tamsulosin  0.4 mg Oral Daily   Continuous Infusions: . 0.9 % NaCl with KCl 20 mEq / L 50 mL/hr at 01/01/16 2000  . pantoprozole (PROTONIX) infusion 8 mg/hr (01/02/16 0500)   Assessment and plan:  Principal Problem:   Chest congestion Active Problems:   GI bleed   COPD exacerbation (HCC)   Gastric erosion with bleeding   Acute blood loss anemia   Mucosal abnormality of stomach   Duodenal erosion   Diabetes mellitus without complication (HCC)   Tobacco abuse   Essential hypertension   OSA (obstructive sleep apnea)  1. COPD with exacerbation. Patient's white blood cell count was greater than 19,000 on admission, but he was afebrile. His chest x-ray on admission revealed cardiomegaly, vascular congestion, and bibasilar atelectasis. Given his tobacco use, age, and history of COPD, treatment was started for acute COPD with bronchitis. He has obstructive sleep apnea, but he is not compliant with CPAP. -We'll continue Rocephin and azithromycin and DuoNeb's. We'll continue guaifenesin, Tessalon Perles, and Tussionex. Solu-Medrol was ordered  and given, but it will be discontinued in the setting of NSAID induced GI bleeding and also because the patient's wheezing has subsided. -Influenza panel was checked and was negative. His white blood cell count has decreased some, but is also likely elevated from the steroid. Tobacco abuse. The patient smokes 2 packs of cigarettes per day. He was advised to stop smoking. Continue nicotine patch.  GI bleed with associated acute blood loss anemia. The patient reported dark red bloody bowel movements prior to hospital admission. He had dark red blood on exam per the EDP. He has a history of colon polyps per colonoscopy 2-3 years ago in [place]. -The patient's hemoglobin was 10.1 with a normal INR on admission. -He was started on a Protonix drip and gentle IV fluids. -Gastroenterologist, Dr. [name] was consulted. The results of the EGD  revealed gastric and duodenal erosions, likely the source of the patient's bleeding. Biopsies were taken and the results are pending. -His hemoglobin has fallen to 7.4. Will transfuse 2 units of packed red blood cells.  Essential hypertension. The patient is treated chronically with metoprolol and lisinopril/HCTZ. They are being held secondary to his low-normal blood pressure. Will continue gentle IV fluids.  Type 2 diabetes mellitus. The patient is treated chronically with metformin. Will continue it. Sliding scale NovoLog and Lantus were added due to steroid-induced hyperglycemia. -We'll order hemoglobin A1c.   Time spent: 30 minutes    [facility] Pager [phone]. If 7PM-7AM, please contact night-coverage at [email or web address], password [facility] 01/02/2016, 8:00 AM  LOS: 2 days

## 2016-01-03 ENCOUNTER — Encounter (HOSPITAL_COMMUNITY)
Admission: EM | Disposition: A | Discharge status: Home / Self Care | Payer: Self-pay | Source: Home / Self Care | Attending: Internal Medicine

## 2016-01-03 ENCOUNTER — Encounter (HOSPITAL_COMMUNITY): Payer: Self-pay | Admitting: *Deleted

## 2016-01-03 DIAGNOSIS — K3189 Other diseases of stomach and duodenum: Secondary | ICD-10-CM

## 2016-01-03 DIAGNOSIS — K5730 Diverticulosis of large intestine without perforation or abscess without bleeding: Secondary | ICD-10-CM

## 2016-01-03 DIAGNOSIS — D128 Benign neoplasm of rectum: Secondary | ICD-10-CM

## 2016-01-03 DIAGNOSIS — D122 Benign neoplasm of ascending colon: Secondary | ICD-10-CM

## 2016-01-03 DIAGNOSIS — Z86010 Personal history of colonic polyps: Secondary | ICD-10-CM

## 2016-01-03 DIAGNOSIS — K639 Disease of intestine, unspecified: Secondary | ICD-10-CM

## 2016-01-03 HISTORY — PX: GIVENS CAPSULE STUDY: SHX5432

## 2016-01-03 HISTORY — PX: COLONOSCOPY: SHX5424

## 2016-01-03 LAB — TYPE AND SCREEN
ABO/RH(D): B POS
Antibody Screen: NEGATIVE
UNIT DIVISION: 0
Unit division: 0
Unit division: 0

## 2016-01-03 LAB — CBC
HEMATOCRIT: 26.2 % — AB (ref 39.0–52.0)
HEMATOCRIT: 28.5 % — AB (ref 39.0–52.0)
HEMATOCRIT: 28.6 % — AB (ref 39.0–52.0)
HEMOGLOBIN: 9.1 g/dL — AB (ref 13.0–17.0)
HEMOGLOBIN: 9.7 g/dL — AB (ref 13.0–17.0)
Hemoglobin: 9.9 g/dL — ABNORMAL LOW (ref 13.0–17.0)
MCH: 29.7 pg (ref 26.0–34.0)
MCH: 30.2 pg (ref 26.0–34.0)
MCH: 30.3 pg (ref 26.0–34.0)
MCHC: 33.9 g/dL (ref 30.0–36.0)
MCHC: 34.7 g/dL (ref 30.0–36.0)
MCHC: 34.7 g/dL (ref 30.0–36.0)
MCV: 87 fL (ref 78.0–100.0)
MCV: 87.2 fL (ref 78.0–100.0)
MCV: 87.5 fL (ref 78.0–100.0)
PLATELETS: 236 10*3/uL (ref 150–400)
Platelets: 204 10*3/uL (ref 150–400)
Platelets: 205 10*3/uL (ref 150–400)
RBC: 3.27 MIL/uL — ABNORMAL LOW (ref 4.22–5.81)
RBC: 3.01 MIL/uL — AB (ref 4.22–5.81)
RBC: 3.27 MIL/uL — AB (ref 4.22–5.81)
RDW: 15.6 % — ABNORMAL HIGH (ref 11.5–15.5)
RDW: 15.8 % — AB (ref 11.5–15.5)
RDW: 15.9 % — AB (ref 11.5–15.5)
WBC: 17.1 10*3/uL — ABNORMAL HIGH (ref 4.0–10.5)
WBC: 14.4 10*3/uL — ABNORMAL HIGH (ref 4.0–10.5)
WBC: 16.2 10*3/uL — AB (ref 4.0–10.5)

## 2016-01-03 LAB — GLUCOSE, CAPILLARY
GLUCOSE-CAPILLARY: 99 mg/dL (ref 65–99)
GLUCOSE-CAPILLARY: 78 mg/dL (ref 65–99)
GLUCOSE-CAPILLARY: 130 mg/dL — AB (ref 65–99)
GLUCOSE-CAPILLARY: 94 mg/dL (ref 65–99)
Glucose-Capillary: 96 mg/dL (ref 65–99)
Glucose-Capillary: 120 mg/dL — ABNORMAL HIGH (ref 65–99)

## 2016-01-03 SURGERY — IMAGING PROCEDURE, GI TRACT, INTRALUMINAL, VIA CAPSULE

## 2016-01-03 SURGERY — COLONOSCOPY
Anesthesia: Moderate Sedation

## 2016-01-03 MED ORDER — MIDAZOLAM HCL 5 MG/5ML IJ SOLN
INTRAMUSCULAR | Status: DC | PRN
Start: 2016-01-03 — End: 2016-01-03
  Administered 2016-01-03: 2 mg via INTRAVENOUS
  Administered 2016-01-03: 1 mg via INTRAVENOUS

## 2016-01-03 MED ORDER — ONDANSETRON HCL 4 MG/2ML IJ SOLN
INTRAMUSCULAR | Status: AC
  Filled 2016-01-03: qty 2

## 2016-01-03 MED ORDER — IPRATROPIUM-ALBUTEROL 0.5-2.5 (3) MG/3ML IN SOLN
3.0000 mL | Freq: Three times a day (TID) | RESPIRATORY_TRACT | Status: DC
  Administered 2016-01-04: 3 mL via RESPIRATORY_TRACT
  Filled 2016-01-03 (×2): qty 3

## 2016-01-03 MED ORDER — MEPERIDINE HCL 100 MG/ML IJ SOLN
INTRAMUSCULAR | Status: AC
  Filled 2016-01-03: qty 2

## 2016-01-03 MED ORDER — MEPERIDINE HCL 100 MG/ML IJ SOLN
INTRAMUSCULAR | Status: DC | PRN
  Administered 2016-01-03: 50 mg via INTRAVENOUS

## 2016-01-03 MED ORDER — SIMETHICONE 40 MG/0.6ML PO SUSP
ORAL | Status: AC
Start: 2016-01-03 — End: 2016-01-03
  Filled 2016-01-03: qty 30

## 2016-01-03 MED ORDER — SODIUM CHLORIDE 0.9 % IV SOLN: INTRAVENOUS | Status: DC

## 2016-01-03 MED ORDER — SIMETHICONE 40 MG/0.6ML PO SUSP
ORAL | Status: DC | PRN
  Administered 2016-01-03: 13:00:00

## 2016-01-03 MED ORDER — ONDANSETRON HCL 4 MG/2ML IJ SOLN
INTRAMUSCULAR | Status: DC | PRN
  Administered 2016-01-03: 4 mg via INTRAVENOUS

## 2016-01-03 MED ORDER — MIDAZOLAM HCL 5 MG/5ML IJ SOLN
INTRAMUSCULAR | Status: AC
  Filled 2016-01-03: qty 10

## 2016-01-03 MED ORDER — DEXTROSE 50 % IV SOLN
INTRAVENOUS | Status: AC
  Filled 2016-01-03: qty 50

## 2016-01-03 MED ORDER — DEXTROSE 50 % IV SOLN
1.0000 | Freq: Once | INTRAVENOUS | Status: AC
  Administered 2016-01-03: 50 mL via INTRAVENOUS

## 2016-01-03 NOTE — Op Note (Signed)
[facility] [address] [place], [address]   COLONOSCOPY PROCEDURE REPORT  PATIENT: Todd George, Todd George  MR#: [id number] BIRTHDATE: 1941/03/13 , 39  yrs. old GENDER: male ENDOSCOPIST: R.  Garfield Cornea, MD FACP [facility] REFERRED [id number] Everette Rank, M.D. PROCEDURE DATE:  01/03/2016 PROCEDURE:   Ileo-colonoscopy with snare polypectomy and ablation and tattooing INDICATIONS:Ongoing stuttering GI bleed essentially negative EGD. MEDICATIONS: Versed 3 mg IV and Demerol 50 mg IV in divided doses. Zofran 4 mg IV. ASA CLASS:       Class III  CONSENT: The risks, benefits, alternatives and imponderables including but not limited to bleeding, perforation as well as the possibility of a missed lesion have been reviewed.  The potential for biopsy, lesion removal, etc. have also been discussed. Questions have been answered.  All parties agreeable.  Please see the history and physical in the medical record for more information.  DESCRIPTION OF PROCEDURE:   After the risks benefits and alternatives of the procedure were thoroughly explained, informed consent was obtained.  The digital rectal exam revealed no abnormalities of the rectum.   The [id number]  endoscope was introduced through the anus and advanced to the terminal ileum which was intubated for a short distance. No adverse events experienced.   The quality of the prep was adequate  The instrument was then slowly withdrawn as the colon was fully examined. Estimated blood loss is zero unless otherwise noted in this procedure report.      COLON FINDINGS: (2) diminutive polyps in the rectum at 7 cm from anal verge; otherwise, normal-appearing rectum.  Scattered left-sided / transverse diverticula; patient had dilute bloody effluent throughout the colon.  No actively bleeding lesions seen. However, the submucosal vascular pattern of the ascending segment/cecum was prominent.  No varices.  There were multiple  AVMs with the largest being approximately 1.5 cm associated with this segment.  Again, no specific, suspicious lesion.  There was a 1.5 cm carpet polyp lying in the valley between 2 folds of the ascending segment as well.  I intubated the terminal ileum a good 15 cm.  In this segment there was more grossly bloody effluent coming down from above.  The mucosa seen, however, appeared normal.   I elected to go ahead and ablate the AVMs about the cecum and ascending segment with multiple applications of the APC, right: colon setting, at 20 J each with the circular probe.  This was done without difficulty.  Good hemostasis maintained.  Attention then turned to the carpet polyp.  It was felt to have been removed totally with piecemeal hot snare technique.  The center of the polypectomy oozed just a bit; it was touched up with the tip of the hot snare loop.  However, it continued to ooze.  I simply placed a 360 clip which sealed this area.  Finally, I tattooed this polypectomy with 2 mL of ink injected with a sclero needle. The rectal polyp cold biopsy/removed. Retroflexion was performed. .  Withdrawal time=41 minutes 0 seconds.  The scope was withdrawn and the procedure completed. COMPLICATIONS: There were no immediate complications.  ENDOSCOPIC IMPRESSION: Active bleeding seen to be arising from the small bowel. Multiple cecal/ascending colon AVMs ablated. Colonic polyp status post piecemeal snare polypectomy and tattooing.  Colonic diverticulosis?"innocent appearing  RECOMMENDATIONS: Clear liquid diet. Proceed with a capsule study the small intestine. No MRI until clips gone. Follow up on pathology.  eSigned:  R. Garfield Cornea, MD FACP [facility] 01/03/2016 2:11 PM   cc:  CPT CODES: ICD CODES:  The ICD and CPT codes recommended by this software are interpretations from the data that the clinical staff has captured with the software.  The verification of the translation of  this report to the ICD and CPT codes and modifiers is the sole responsibility of the health care institution and practicing physician where this report was generated.  [employer] will not be held responsible for the validity of the ICD and CPT codes included on this report.  AMA assumes no liability for data contained or not contained herein. CPT is a Designer, television/film set of the [employer].  PATIENT NAME:  Todd George, Todd George MR#: [id number]

## 2016-01-03 NOTE — Progress Notes (Signed)
[facility] PROGRESS NOTE  Todd George [id number] DOB: 1941/08/09 DOA: 12/31/2015 PCP: [name], MD    Code Status: Full code Family Communication: Discussed with patient; family not available Disposition Plan: Discharge with clinically appropriate, likely in the next 2-3 days.   Consultants:  Gastroenterology, Dr. [name]  Procedures:  Colonoscopy, pending  EGD 01/01/16: No blood anywhere in the upper GI tract; small hiatal hernia; multiple linear antral erosions extending into the pylorus; erosions seen in the bulb and second portion of the duodenum; no frank ulcers or infiltrating process seen; status post biopsy of the gastric and duodenal close.  Antibiotics:  Rocephin 1/7>>  Azithromycin 1/7>>  HPI/Subjective: Patient reports some mildly bloody stools. He is drinking GoLYTELY for bowel prep for the colonoscopy. Otherwise, he feels he is breathing better. Cough is less.  Objective: Filed Vitals:   01/03/16 1000 01/03/16 1100  BP: 104/55 119/53  Pulse: 71 71  Temp:    Resp: 17 19   temperature 97.0. Oxygen saturation 97 percent on room air.  Intake/Output Summary (Last 24 hours) at 01/03/16 1142 Last data filed at 01/03/16 1100  Gross per 24 hour  Intake   2180 ml  Output    800 ml  Net   1380 ml   Filed Weights   12/31/15 1538 01/01/16 0400 01/02/16 0500  Weight: 102.059 kg (225 lb) 101.4 kg (223 lb 8.7 oz) 102.8 kg (226 lb 10.1 oz)    Exam:   General:  Pleasant alert 75 year old man in no acute distress.  Cardiovascular: S1, S2, no murmurs, rubs or gallops.  Respiratory: Lungs with coarse breath sounds, but rare audible wheeze, significantly decreased from yesterday. He continues to have multiple bouts of coughing.  Abdomen: Obese, positive bowel sounds, soft, nontender, nondistended.  Musculoskeletal/extremities: No pedal edema. No acute hot red joints.  Neurologic: He is alert and oriented 3.   Data Reviewed: Basic Metabolic  Panel:  Recent Labs Lab 12/31/15 1550 01/02/16 0518  NA 137 137  K 3.8 4.9  CL 97* 107  CO2 32 25  GLUCOSE 133* 169*  BUN 40* 29*  CREATININE 1.54* 1.11  CALCIUM 9.6 8.5*   Liver Function Tests:  Recent Labs Lab 12/31/15 1550 01/02/16 0518  AST 62* 55*  ALT 27 26  ALKPHOS 58 42  BILITOT 0.7 0.4  PROT 6.2* 4.9*  ALBUMIN 3.3* 2.5*   No results for input(s): LIPASE, AMYLASE in the last 168 hours. No results for input(s): AMMONIA in the last 168 hours. CBC:  Recent Labs Lab 12/31/15 1550  01/02/16 0328 01/02/16 1816 01/02/16 2349 01/03/16 0411 01/03/16 0801  WBC 19.7*  < > 18.5* 18.8* 16.2* 17.1* 14.4*  NEUTROABS 15.9*  --   --   --   --   --   --   HGB 10.1*  < > 7.4* 9.3* 9.1* 9.9* 9.7*  HCT 29.4*  < > 21.2* 26.9* 26.2* 28.5* 28.6*  MCV 89.4  < > 88.3 87.3 87.0 87.2 87.5  PLT 314  < > 202 210 205 236 204  < > = values in this interval not displayed. Cardiac Enzymes: No results for input(s): CKTOTAL, CKMB, CKMBINDEX, TROPONINI in the last 168 hours. BNP (last 3 results) No results for input(s): BNP in the last 8760 hours.  ProBNP (last 3 results) No results for input(s): PROBNP in the last 8760 hours.  CBG:  Recent Labs Lab 01/02/16 1126 01/02/16 1635 01/02/16 2130 01/03/16 0820 01/03/16 1117  GLUCAP 125* 90 89 96  120*    Recent Results (from the past 240 hour(s))  MRSA PCR Screening     Status: None   Collection Time: 12/31/15  6:58 PM  Result Value Ref Range Status   MRSA by PCR NEGATIVE NEGATIVE Final    Comment:        The GeneXpert MRSA Assay (FDA approved for NASAL specimens only), is one component of a comprehensive MRSA colonization surveillance program. It is not intended to diagnose MRSA infection nor to guide or monitor treatment for MRSA infections.      Studies: No results found.  Scheduled Meds: . azithromycin  500 mg Intravenous Q24H  . benzonatate  100 mg Oral TID  . cefTRIAXone (ROCEPHIN)  IV  1 g Intravenous  Q24H  . chlorpheniramine-HYDROcodone  5 mL Oral Q12H  . guaiFENesin  600 mg Oral BID  . insulin aspart  0-5 Units Subcutaneous QHS  . insulin aspart  0-9 Units Subcutaneous TID WC  . insulin glargine  12 Units Subcutaneous QHS  . ipratropium-albuterol  3 mL Nebulization Q6H  . metFORMIN  500 mg Oral Q breakfast  . nicotine  21 mg Transdermal Daily  . [START ON 01/04/2016] pantoprazole (PROTONIX) IV  40 mg Intravenous Q12H  . simethicone      . simvastatin  20 mg Oral q1800  . sodium phosphate  1 enema Rectal BID  . tamsulosin  0.4 mg Oral Daily   Continuous Infusions: . sodium chloride    . sodium chloride    . 0.9 % NaCl with KCl 20 mEq / L 50 mL/hr at 01/02/16 0950  . pantoprozole (PROTONIX) infusion 8 mg/hr (01/03/16 0100)   Assessment and plan:  Principal Problem:   Chest congestion Active Problems:   GI bleed   COPD exacerbation (HCC)   Gastric erosion with bleeding   Acute blood loss anemia   Mucosal abnormality of stomach   Duodenal erosion   Diabetes mellitus without complication (HCC)   Tobacco abuse   Essential hypertension   OSA (obstructive sleep apnea)  1. COPD with exacerbation. Patient's white blood cell count was greater than 19,000 on admission, but he was afebrile. His chest x-ray on admission revealed cardiomegaly, vascular congestion, and bibasilar atelectasis. Given his tobacco use, age, and history of COPD, treatment was started for acute COPD with bronchitis. He has obstructive sleep apnea, but he is not compliant with CPAP. -We'll continue Rocephin and azithromycin and DuoNeb's. We'll continue guaifenesin, Tessalon Perles, and Tussionex. Solu-Medrol was ordered and given, but it was discontinued in the setting of NSAID induced GI bleeding and also because the patient's wheezing has subsided. -Influenza panel was checked and was negative. His white blood cell count has decreased some, secondary to discontinuation of IV steroid and clinical  improvement. Tobacco abuse. The patient smokes 2 packs of cigarettes per day. He was advised to stop smoking. Continue nicotine patch.  GI bleed with associated acute blood loss anemia. The patient reported dark red bloody bowel movements prior to hospital admission. He had dark red blood on exam per the EDP. He has a history of colon polyps per colonoscopy 2-3 years ago in [place]. -The patient's hemoglobin was 10.1 with a normal INR on admission. -He was started on a Protonix drip and gentle IV fluids. -Gastroenterologist, Dr. [name] was consulted. The results of the EGD revealed gastric and duodenal erosions, likely the source of the patient's bleeding. Biopsies were taken and the results are pending. -His hemoglobin fell to 7.4. He was transfused  2 units of packed red blood cells on 01/02/16. His hemoglobin improved appropriately. We'll continue to monitor.  Essential hypertension. The patient is treated chronically with metoprolol and lisinopril/HCTZ. They are being held secondary to his low-normal blood pressures. Will continue gentle IV fluids.  Type 2 diabetes mellitus. The patient is treated chronically with metformin. Will continue it. Sliding scale NovoLog and Lantus were added due to steroid-induced hyperglycemia. -We'll order hemoglobin A1c; pending.   Time spent: 30 minutes    [facility] Pager [phone]. If 7PM-7AM, please contact night-coverage at [email or web address], password [facility] 01/03/2016, 11:42 AM  LOS: 3 days

## 2016-01-03 NOTE — Progress Notes (Signed)
Seen briefly this morning. Has started remainder of split prep dosing this morning. Procedure scheduled for early afternoon. Tolerating prep without N/V. Still with bloody stools but has just started final 2 liters. Discussed with patient continuing this and importance of a good prep prior to evaluation. He states understanding and is without any complaints.   Orvil Feil, ANP-BC [facility]

## 2016-01-04 ENCOUNTER — Encounter (HOSPITAL_COMMUNITY): Payer: Self-pay | Admitting: Internal Medicine

## 2016-01-04 DIAGNOSIS — K635 Polyp of colon: Secondary | ICD-10-CM | POA: Diagnosis present

## 2016-01-04 DIAGNOSIS — K5521 Angiodysplasia of colon with hemorrhage: Secondary | ICD-10-CM

## 2016-01-04 DIAGNOSIS — D62 Acute posthemorrhagic anemia: Secondary | ICD-10-CM

## 2016-01-04 DIAGNOSIS — K5790 Diverticulosis of intestine, part unspecified, without perforation or abscess without bleeding: Secondary | ICD-10-CM | POA: Diagnosis present

## 2016-01-04 DIAGNOSIS — L03317 Cellulitis of buttock: Secondary | ICD-10-CM | POA: Diagnosis not present

## 2016-01-04 HISTORY — DX: Angiodysplasia of colon with hemorrhage: K55.21

## 2016-01-04 HISTORY — DX: Polyp of colon: K63.5

## 2016-01-04 LAB — CBC
HCT: 26 % — ABNORMAL LOW (ref 39.0–52.0)
Hemoglobin: 8.9 g/dL — ABNORMAL LOW (ref 13.0–17.0)
MCH: 30.5 pg (ref 26.0–34.0)
MCHC: 34.2 g/dL (ref 30.0–36.0)
MCV: 89 fL (ref 78.0–100.0)
PLATELETS: 236 10*3/uL (ref 150–400)
RBC: 2.92 MIL/uL — AB (ref 4.22–5.81)
RDW: 15.5 % (ref 11.5–15.5)
WBC: 15.5 10*3/uL — ABNORMAL HIGH (ref 4.0–10.5)

## 2016-01-04 LAB — GLUCOSE, CAPILLARY
GLUCOSE-CAPILLARY: 123 mg/dL — AB (ref 65–99)
Glucose-Capillary: 79 mg/dL (ref 65–99)
Glucose-Capillary: 236 mg/dL — ABNORMAL HIGH (ref 65–99)
Glucose-Capillary: 105 mg/dL — ABNORMAL HIGH (ref 65–99)
Glucose-Capillary: 90 mg/dL (ref 65–99)

## 2016-01-04 LAB — BASIC METABOLIC PANEL
Anion gap: 6 (ref 5–15)
BUN: 13 mg/dL (ref 6–20)
CALCIUM: 8.7 mg/dL — AB (ref 8.9–10.3)
CO2: 27 mmol/L (ref 22–32)
CREATININE: 0.96 mg/dL (ref 0.61–1.24)
Chloride: 106 mmol/L (ref 101–111)
GFR calc Af Amer: >60 mL/min (ref >60)
Glucose, Bld: 94 mg/dL (ref 65–99)
Potassium: 4.2 mmol/L (ref 3.5–5.1)
SODIUM: 139 mmol/L (ref 135–145)

## 2016-01-04 LAB — HEMOGLOBIN A1C
Hgb A1c MFr Bld: 5.7 % — ABNORMAL HIGH (ref 4.8–5.6)
Mean Plasma Glucose: 117 mg/dL

## 2016-01-04 MED ORDER — METOPROLOL SUCCINATE ER 25 MG PO TB24
25.0000 mg | ORAL_TABLET | Freq: Every day | ORAL | Status: DC
  Administered 2016-01-04 – 2016-01-05 (×2): 25 mg via ORAL
  Filled 2016-01-04 (×2): qty 1

## 2016-01-04 MED ORDER — VANCOMYCIN HCL IN DEXTROSE 750-5 MG/150ML-% IV SOLN
750.0000 mg | Freq: Two times a day (BID) | INTRAVENOUS | Status: DC
  Administered 2016-01-04: 750 mg via INTRAVENOUS
  Filled 2016-01-04 (×4): qty 150

## 2016-01-04 MED ORDER — VANCOMYCIN HCL 10 G IV SOLR
2000.0000 mg | Freq: Once | INTRAVENOUS | Status: AC
  Administered 2016-01-04: 2000 mg via INTRAVENOUS
  Filled 2016-01-04: qty 2000

## 2016-01-04 MED ORDER — LISINOPRIL 10 MG PO TABS
20.0000 mg | ORAL_TABLET | Freq: Every day | ORAL | Status: DC
  Administered 2016-01-05: 20 mg via ORAL
  Filled 2016-01-04: qty 2

## 2016-01-04 MED ORDER — IPRATROPIUM-ALBUTEROL 0.5-2.5 (3) MG/3ML IN SOLN
3.0000 mL | Freq: Two times a day (BID) | RESPIRATORY_TRACT | Status: DC
  Filled 2016-01-04: qty 3

## 2016-01-04 NOTE — Progress Notes (Signed)
  REVIEWED-NO ADDITIONAL RECOMMENDATIONS.  Subjective: No overt GI bleeding. Mild abdominal cramping but no BM since colonoscopy. Receiving breathing treatment.   Objective: Vital signs in last 24 hours: Temp:  [97 F (36.1 C)-97.9 F (36.6 C)] 97.8 F (36.6 C) (01/10 0400) Pulse Rate:  [61-93] 74 (01/10 0800) Resp:  [11-24] 18 (01/10 0800) BP: (101-154)/(39-91) 150/66 mmHg (01/10 0800) SpO2:  [91 %-100 %] 93 % (01/10 0800) Weight:  [226 lb (102.513 kg)] 226 lb (102.513 kg) (01/09 1221) Last BM Date: 01/03/16 General:   Alert and oriented, pleasant Head:  Normocephalic and atraumatic. Eyes:  No icterus, sclera clear. Conjuctiva pink.  Abdomen:  Bowel sounds present, soft, non-tender, non-distended. Neurologic:  Alert and  oriented x4;  grossly normal neurologically. Psych:  Alert and cooperative. Normal mood and affect.  Intake/Output from previous day: 01/09 0701 - 01/10 0700 In: 1900 [I.V.:1600; IV Piggyback:300] Out: 1950 [Urine:1950] Intake/Output this shift:    Lab Results:  Recent Labs  01/03/16 0411 01/03/16 0801 01/04/16 0428  WBC 17.1* 14.4* 15.5*  HGB 9.9* 9.7* 8.9*  HCT 28.5* 28.6* 26.0*  PLT 236 204 236   BMET  Recent Labs  01/02/16 0518 01/04/16 0428  NA 137 139  K 4.9 4.2  CL 107 106  CO2 25 27  GLUCOSE 169* 94  BUN 29* 13  CREATININE 1.11 0.96  CALCIUM 8.5* 8.7*   LFT  Recent Labs  01/02/16 0518  PROT 4.9*  ALBUMIN 2.5*  AST 55*  ALT 26  ALKPHOS 42  BILITOT 0.4    Assessment: 75 year old male admitted with GI bleed, s/p EGD without significant findings and colonoscopy revealing active bleeding arising from the small bowel. Multiple polyps removed at time of colonoscopy with cecal/ascending colon AVMs non-bleeding but ablated. Capsule study placed overnight, to be read today. Hgb down this morning to 8.9. No further overt GI bleeding since colonoscopy. Will read capsule study today. I have increased his diet to just full liquids  for now, in case of need for subsequent procedure.   Plan: Continue supportive measures Capsule study to be read today May have full liquids; hold on advancing diet any further until capsule study report completed.   Orvil Feil, ANP-BC [facility]    LOS: 4 days    01/04/2016, 8:18 AM    Addendum at [id number]: capsule study reviewed. Incomplete. Likely source of bleeding is AVMs; however, it is incomplete. Needs abdominal xray in 48 hours. If inpatient, may complete while here. If not, will need to be arranged as outpatient. Will also need CTE as outpatient. Please see capsule study report for full details. Orvil Feil, ANP-BC [facility]

## 2016-01-04 NOTE — Care Management Note (Signed)
Case Management Note  Patient Details  Name: HAIZEN SAFER MRN: [id number] Date of Birth: 03/05/1941  Subjective/Objective:                  Admitted for GI bleed. Pt is from home, lives alone and is ind with ADL's. Pt has no HH services or DME's prior to admission. Pt's PCP has retired and he is unsure who he will f/u with. Pt plans to return home with self care.   Action/Plan: Dr. Jani Gravel has taken over Dr. Everette Rank' practice, office called and Dr. Maudie Mercury will see Dr. Everette Rank' patients. Pt will be able to f/u with Dr. Maudie Mercury. Will cont to follow for DC planning.   Expected Discharge Date:    01/07/2016              Expected Discharge Plan:  Home/Self Care  In-House Referral:  NA  Discharge planning Services  CM Consult  Post Acute Care Choice:  NA Choice offered to:  NA  DME Arranged:    DME Agency:     HH Arranged:    HH Agency:     Status of Service:  In process, will continue to follow  Medicare Important Message Given:    Date Medicare IM Given:    Medicare IM give by:    Date Additional Medicare IM Given:    Additional Medicare Important Message give by:     If discussed at [place] of Stay Meetings, dates discussed:    Additional Comments:  Sherald Barge, RN 01/04/2016, 4:09 PM

## 2016-01-04 NOTE — Procedures (Signed)
Small Bowel Givens Capsule Study Procedure date:  01/04/16  Referring Provider:  Dr. [name]  PCP:  Dr. [name], MD  Indication for procedure:   75 year old male with lower GI bleed, historically taking NSAIDs. EGD with multiple gastric and duodenal erosions likely secondary to NSAIDs, s/p biopsy. 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 Polypectmoy with piecemeal removal and tattooing. Active bleeding seen to be arising from the small bowel. Capsule study indicated for further evaluation.   Findings:   Incomplete study. Unable to definitely identify the cecum, and the last image appears to still be in the small bowel. Evidence of scattered erosions, AVMs throughout the small bowel. Likely several "oozing" of AVMs at 45:37, 2:27:29. 2:31:41, and 2:34:02 for instance. However, no brisk, overt bleeding lesion.   First Gastric image:  00:51 First Duodenal image: 20:18 First Ileo-Cecal Valve image: Unable to identify First Cecal image: Not appreciate on study Gastric Passage time: 0h 38m Small Bowel Passage time:  Unable to determine   Summary & Recommendations: 75 year old male with likely GI bleeding secondary to AVMs in the setting of NSAID use. However, study is not complete and possible occult bleeding lesion may not have been appreciated on today's examination. Recommend absolute avoidance of NSAIDs in the future. May require enteroscopy if further evidence of bleeding requiring transfusion; however, as of today, it appears he has had no further overt GI bleeding. Will need abdominal xray in 48 hours to document passage of capsule. Additionally, will need CTE as an outpatient to further evaluate and assess for any possible occult small bowel lesion that may not have been appreciated due to lack of complete study. This plan briefly discussed with Dr. [name].   Orvil Feil, ANP-BC [facility]

## 2016-01-04 NOTE — Progress Notes (Signed)
[facility] PROGRESS NOTE  Todd George [id number] DOB: [birth year] DOA: 12/31/2015 PCP: [name], MD    Code Status: Full code Family Communication: Discussed with patient; family not available Disposition Plan: Discharge with clinically appropriate.   Consultants:  Gastroenterology, Dr. [name]  Procedures:  Capsule study, pending  1//9/17: leo-Colonoscopy, with snare polypectomy, ablation and tatooing: Active bleeding seemed to be arising from the small bowel; multiple cecal/ascending colon AVMs-ablated; colon polyp, status post piecemeal snare polypectomy and tattooing; colonic diverticulosis-innocent appearing.  EGD 01/01/16: No blood anywhere in the upper GI tract; small hiatal hernia; multiple linear antral erosions extending into the pylorus; erosions seen in the bulb and second portion of the duodenum; no frank ulcers or infiltrating process seen; status post biopsy of the gastric and duodenal mucosa.  Antibiotics:  Rocephin 1/7>>  Azithromycin 1/7>>  HPI/Subjective: Patient has some mild lower abdominal discomfort. He denies nausea vomiting. He complains of a sore on his left buttock which he gets occasionally. He fixes an abscess.  Objective: Filed Vitals:   01/04/16 0800 01/04/16 0827  BP: 150/66   Pulse: 74   Temp:  97.5 F (36.4 C)  Resp: 18    temperature 97.5. Oxygen saturation 99 percent on room air. Blood pressure 150/66. Pulse 74. Respiratory rate 18.  Intake/Output Summary (Last 24 hours) at 01/04/16 0831 Last data filed at 01/04/16 0600  Gross per 24 hour  Intake   1825 ml  Output   1150 ml  Net    675 ml   Filed Weights   01/01/16 0400 01/02/16 0500 01/03/16 1221  Weight: 101.4 kg (223 lb 8.7 oz) 102.8 kg (226 lb 10.1 oz) 102.513 kg (226 lb)    Exam:   General:  Pleasant alert 75 year old man in no acute distress.  Cardiovascular: S1, S2, no murmurs, rubs or gallops.  Respiratory: Lungs with coarse breath sounds, but  resolution of audible wheezes.  Abdomen: Obese, positive bowel sounds, soft, mild hypogastric tenderness.  Musculoskeletal/extremities: No pedal edema. No acute hot red joints.  Neurologic: He is alert and oriented 3.   Skin: Approximately 4 cm indurated area on the left buttock with mild surrounding erythema; no active drainage; mildly tender.  Data Reviewed: Basic Metabolic Panel:  Recent Labs Lab 12/31/15 1550 01/02/16 0518 01/04/16 0428  NA 137 137 139  K 3.8 4.9 4.2  CL 97* 107 106  CO2 32 25 27  GLUCOSE 133* 169* 94  BUN 40* 29* 13  CREATININE 1.54* 1.11 0.96  CALCIUM 9.6 8.5* 8.7*   Liver Function Tests:  Recent Labs Lab 12/31/15 1550 01/02/16 0518  AST 62* 55*  ALT 27 26  ALKPHOS 58 42  BILITOT 0.7 0.4  PROT 6.2* 4.9*  ALBUMIN 3.3* 2.5*   No results for input(s): LIPASE, AMYLASE in the last 168 hours. No results for input(s): AMMONIA in the last 168 hours. CBC:  Recent Labs Lab 12/31/15 1550  01/02/16 1816 01/02/16 2349 01/03/16 0411 01/03/16 0801 01/04/16 0428  WBC 19.7*  < > 18.8* 16.2* 17.1* 14.4* 15.5*  NEUTROABS 15.9*  --   --   --   --   --   --   HGB 10.1*  < > 9.3* 9.1* 9.9* 9.7* 8.9*  HCT 29.4*  < > 26.9* 26.2* 28.5* 28.6* 26.0*  MCV 89.4  < > 87.3 87.0 87.2 87.5 89.0  PLT 314  < > 210 205 236 204 236  < > = values in this interval not displayed. Cardiac Enzymes: No  results for input(s): CKTOTAL, CKMB, CKMBINDEX, TROPONINI in the last 168 hours. BNP (last 3 results) No results for input(s): BNP in the last 8760 hours.  ProBNP (last 3 results) No results for input(s): PROBNP in the last 8760 hours.  CBG:  Recent Labs Lab 01/03/16 1226 01/03/16 1604 01/03/16 1815 01/03/16 2000 01/04/16 0757  GLUCAP 99 78 130* 94 79    Recent Results (from the past 240 hour(s))  MRSA PCR Screening     Status: None   Collection Time: 12/31/15  6:58 PM  Result Value Ref Range Status   MRSA by PCR NEGATIVE NEGATIVE Final    Comment:         The GeneXpert MRSA Assay (FDA approved for NASAL specimens only), is one component of a comprehensive MRSA colonization surveillance program. It is not intended to diagnose MRSA infection nor to guide or monitor treatment for MRSA infections.      Studies: No results found.  Scheduled Meds: . azithromycin  500 mg Intravenous Q24H  . benzonatate  100 mg Oral TID  . cefTRIAXone (ROCEPHIN)  IV  1 g Intravenous Q24H  . chlorpheniramine-HYDROcodone  5 mL Oral Q12H  . guaiFENesin  600 mg Oral BID  . insulin aspart  0-5 Units Subcutaneous QHS  . insulin aspart  0-9 Units Subcutaneous TID WC  . insulin glargine  12 Units Subcutaneous QHS  . ipratropium-albuterol  3 mL Nebulization TID  . metFORMIN  500 mg Oral Q breakfast  . nicotine  21 mg Transdermal Daily  . pantoprazole (PROTONIX) IV  40 mg Intravenous Q12H  . simvastatin  20 mg Oral q1800  . sodium phosphate  1 enema Rectal BID  . tamsulosin  0.4 mg Oral Daily   Continuous Infusions: . sodium chloride    . 0.9 % NaCl with KCl 20 mEq / L 50 mL/hr at 01/04/16 0600   Assessment and plan:  Principal Problem:   Chest congestion Active Problems:   GI bleed   COPD exacerbation (HCC)   Gastric erosion with bleeding   Colon polyps   Diverticulosis   AVM (arteriovenous malformation) of colon with hemorrhage   Acute blood loss anemia   Mucosal abnormality of stomach   Duodenal erosion   Diabetes mellitus without complication (HCC)   Tobacco abuse   Essential hypertension   OSA (obstructive sleep apnea)   Cellulitis of buttock, left  1. COPD with exacerbation. Patient's white blood cell count was greater than 19,000 on admission, but he was afebrile. His chest x-ray on admission revealed cardiomegaly, vascular congestion, and bibasilar atelectasis. Given his tobacco use, age, and history of COPD, treatment was started for acute COPD with bronchitis. He has obstructive sleep apnea, but he is not compliant with CPAP. -We'll  continue Rocephin and azithromycin and DuoNeb's. We'll continue guaifenesin, Tessalon Perles, and Tussionex. Solu-Medrol was ordered and given, but it was discontinued in the setting of NSAID induced GI bleeding and also because the patient's wheezing has subsided. -Influenza panel was checked and was negative. His white blood cell count has decreased some, secondary to discontinuation of IV steroid and clinical improvement. Tobacco abuse. The patient smokes 2 packs of cigarettes per day. He was advised to stop smoking. Continue nicotine patch.  GI bleed with associated acute blood loss anemia. The patient reported dark red bloody bowel movements prior to hospital admission. He had dark red blood on exam per the EDP. He has a history of colon polyps per colonoscopy 2-3 years ago in [place]. -  The patient's hemoglobin was 10.1 with a normal INR on admission. -He was started on a Protonix drip and gentle IV fluids. -Gastroenterologist, Dr. [name] was consulted.  EGD performed and revealed gastric and duodenal erosions. Biopsies were taken and the results are pending. -Patient continued to bleed. His hemoglobin fell to 7.4. He was transfused 2 units of packed red blood cells on 01/02/16 with improvement in his hemoglobin. -Dr. [name] performed a colonoscopy and it revealed colon AVMs, colon polyps, and diverticulosis. He believed that the bleeding was coming from the small bowel. Therefore, capsule study was ordered and is pending.  Essential hypertension. The patient is treated chronically with metoprolol and lisinopril/HCTZ. They were initially held secondary to his low-normal blood pressures. Now that his blood pressure has increased, will restart metoprolol and lisinopril. We'll hold HCTZ.  Type 2 diabetes mellitus. The patient is treated chronically with metformin. Will continue it. Sliding scale NovoLog and Lantus were added due to steroid-induced hyperglycemia. He had low-normal blood pressures on  yesterday. His hemoglobin A1c is only 5.7. -We'll discontinue Lantus and continue sliding scale NovoLog.  Left buttock cellulitis with possible abscess. We'll start vancomycin. Will order warm compresses. Consider general surgery consult if conservative measures not effective.   Time spent: 30 minutes    [facility] Pager [phone]. If 7PM-7AM, please contact night-coverage at [email or web address], password [facility] 01/04/2016, 8:31 AM  LOS: 4 days

## 2016-01-04 NOTE — Progress Notes (Signed)
ANTIBIOTIC CONSULT NOTE - INITIAL  Pharmacy Consult for Vancomycin Indication: Cellulitis  No Known Allergies  Patient Measurements: Height: 5\' 10"  (177.8 cm) Weight: 226 lb (102.513 kg) IBW/kg (Calculated) : 73  Vital Signs: Temp: 97.5 F (36.4 C) (01/10 0827) Temp Source: Oral (01/10 0827) BP: 150/66 mmHg (01/10 0800) Pulse Rate: 74 (01/10 0800) Intake/Output from previous day: 01/09 0701 - 01/10 0700 In: 1900 [I.V.:1600; IV Piggyback:300] Out: 1950 [Urine:1950] Intake/Output from this shift:    Labs:  Recent Labs  01/02/16 0518  01/03/16 0411 01/03/16 0801 01/04/16 0428  WBC  --   < > 17.1* 14.4* 15.5*  HGB  --   < > 9.9* 9.7* 8.9*  PLT  --   < > 236 204 236  CREATININE 1.11  --   --   --  0.96  < > = values in this interval not displayed. Estimated Creatinine Clearance: 78.5 mL/min (by C-G formula based on Cr of 0.96). No results for input(s): VANCOTROUGH, VANCOPEAK, VANCORANDOM, GENTTROUGH, GENTPEAK, GENTRANDOM, TOBRATROUGH, TOBRAPEAK, TOBRARND, AMIKACINPEAK, AMIKACINTROU, AMIKACIN in the last 72 hours.   Microbiology: Recent Results (from the past 720 hour(s))  MRSA PCR Screening     Status: None   Collection Time: 12/31/15  6:58 PM  Result Value Ref Range Status   MRSA by PCR NEGATIVE NEGATIVE Final    Comment:        The GeneXpert MRSA Assay (FDA approved for NASAL specimens only), is one component of a comprehensive MRSA colonization surveillance program. It is not intended to diagnose MRSA infection nor to guide or monitor treatment for MRSA infections.     Medical History: Past Medical History  Diagnosis Date  . Hyperlipidemia   . Cellulitis   . BPH (benign prostatic hyperplasia)   . Chronic back pain   . Carotid artery disease ([place])   . COPD (chronic obstructive pulmonary disease) ([place])     2ppd  . OSA on CPAP     not used machine in over 10 yrs  . Diabetes mellitus without complication ([place])   . Hypertension   . Duodenal erosion  01/02/2016  . Gastric erosion with bleeding 01/02/2016  . Colon polyps 01/04/2016  . 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    Medications:  See med rec Assessment: 75 yo male admitted with chest congestion and COPD exacerbation. Patient also has GI bleed. In addition, he had left buttock cellulitis with possible abscess. Empiric tx with Vancomycin.   Goal of Therapy:  Vancomycin trough level 10-15 mcg/ml  Plan:  Vancomycin 2gm loading dose, then 750mg  IV q12h Measure antibiotic drug levels at steady state Follow up culture results  Monitor V/S and labs  Isac Sarna, BS [place], BCPS Clinical Pharmacist Pager [phone] 01/04/2016,8:53 AM

## 2016-01-05 ENCOUNTER — Other Ambulatory Visit: Payer: Self-pay

## 2016-01-05 ENCOUNTER — Telehealth: Payer: Self-pay | Admitting: Gastroenterology

## 2016-01-05 DIAGNOSIS — I10 Essential (primary) hypertension: Secondary | ICD-10-CM

## 2016-01-05 DIAGNOSIS — K635 Polyp of colon: Secondary | ICD-10-CM

## 2016-01-05 DIAGNOSIS — K2961 Other gastritis with bleeding: Secondary | ICD-10-CM

## 2016-01-05 DIAGNOSIS — K922 Gastrointestinal hemorrhage, unspecified: Secondary | ICD-10-CM

## 2016-01-05 DIAGNOSIS — K269 Duodenal ulcer, unspecified as acute or chronic, without hemorrhage or perforation: Secondary | ICD-10-CM

## 2016-01-05 DIAGNOSIS — L03317 Cellulitis of buttock: Secondary | ICD-10-CM

## 2016-01-05 DIAGNOSIS — Q2733 Arteriovenous malformation of digestive system vessel: Secondary | ICD-10-CM

## 2016-01-05 LAB — CBC
HEMATOCRIT: 27 % — AB (ref 39.0–52.0)
HEMOGLOBIN: 9 g/dL — AB (ref 13.0–17.0)
MCH: 29.6 pg (ref 26.0–34.0)
MCHC: 33.3 g/dL (ref 30.0–36.0)
MCV: 88.8 fL (ref 78.0–100.0)
Platelets: 260 10*3/uL (ref 150–400)
RBC: 3.04 MIL/uL — AB (ref 4.22–5.81)
RDW: 14.8 % (ref 11.5–15.5)
WBC: 12.2 10*3/uL — AB (ref 4.0–10.5)

## 2016-01-05 LAB — GLUCOSE, CAPILLARY
GLUCOSE-CAPILLARY: 103 mg/dL — AB (ref 65–99)
Glucose-Capillary: 88 mg/dL (ref 65–99)

## 2016-01-05 MED ORDER — DOXYCYCLINE HYCLATE 100 MG PO TBEC: 100.0000 mg | DELAYED_RELEASE_TABLET | Freq: Two times a day (BID) | ORAL | Status: DC

## 2016-01-05 MED ORDER — PANTOPRAZOLE SODIUM 40 MG PO TBEC: 40.0000 mg | DELAYED_RELEASE_TABLET | Freq: Two times a day (BID) | ORAL | Status: DC

## 2016-01-05 MED ORDER — ALBUTEROL SULFATE HFA 108 (90 BASE) MCG/ACT IN AERS: 2.0000 | INHALATION_SPRAY | Freq: Four times a day (QID) | RESPIRATORY_TRACT | Status: DC | PRN

## 2016-01-05 NOTE — Care Management Important Message (Signed)
Important Message  Patient Details  Name: Todd George MRN: [id number] Date of Birth: [birth year]   Medicare Important Message Given:  Yes    Sherald Barge, RN 01/05/2016, 3:50 PM

## 2016-01-05 NOTE — Discharge Summary (Signed)
Physician Discharge Summary  Todd George [id number] DOB: 16-May-1941 DOA: 12/31/2015  PCP: [name], MD  Admit date: 12/31/2015 Discharge date: 01/05/2016  Time spent: 45 minutes  Recommendations for Outpatient Follow-up:  -Will be discharged home today. -Has f/u appointments with GI and with CT enterography scheduled.  Discharge Diagnoses:  Principal Problem:   Chest congestion Active Problems:   Diabetes mellitus without complication (HCC)   GI bleed   COPD exacerbation (HCC)   Tobacco abuse   Essential hypertension   OSA (obstructive sleep apnea)   Acute blood loss anemia   Mucosal abnormality of stomach   Gastric erosion with bleeding   Duodenal erosion   Colon polyps   Diverticulosis   AVM (arteriovenous malformation) of colon with hemorrhage   Cellulitis of buttock, left   Discharge Condition: Stable and improved  Filed Weights   01/02/16 0500 01/03/16 1221 01/05/16 0500  Weight: 102.8 kg (226 lb 10.1 oz) 102.513 kg (226 lb) 103 kg (227 lb 1.2 oz)    History of present illness:  As per Dr. Nehemiah George 1/6: Todd George is a 75 y.o. male  With a history of diabetes, hyperlipidemia, COPD, tobacco abuse, obstructive sleep apnea not on C Pap, hypertension. Patient comes in with 2 weeks of worsening chest congestion with cough. Cough is productive with whitish phlegm occasionally yellow mucus. This is accompanied by shortness of breath that is worse with activity and improved with rest. His symptoms have not been worsening, but have not been improving.  Additionally the patient started having bloody stools early this morning was preceded by some abdominal pain yesterday. The abdominal pain was vague and on the right side. He admits to using naproxen twice a day for a couple of years. Denies melena, but states that the rectal bleeding was dark red. Denies alcohol use. He is a smoker, but has weaned down to about half a pack a day.  Hospital Course:   GI  Bleed with Acute Blood Loss Anemia -EGD with gastric and duodenal erosions likely NSAID-induced. -Colonoscopy with cecal/ascending colonic AVMs: ablated. -Capsule endoscopy was incomplete and has been scheduled by GI for an OP CTE. -Was transfused 2 units of PRBCs on 1/8. -Hb stable on DC at 9.0.  Left Buttock Abscess -Is draining. -Instructed on wound care. -Will receive doxycycline for 7 days.  Dm II -Well controlled. -Continue metformin.  HTN -Well controlled. -Continue home medications.  Tobacco Abuse -Counseled on cessation. -Nicotine patch was provided while in the hospital.  COPD with Acute Exacerbation -Resolved. -Influenza PCR/CXR negative. -Off steroids. -Albuterol PRN.  Procedures:  EGD/Colonoscopy as above  Consultations:  GI  Discharge Instructions      Discharge Instructions    Diet - low sodium heart healthy    Complete by:  As directed      Increase activity slowly    Complete by:  As directed             Medication List    TAKE these medications        albuterol 108 (90 Base) MCG/ACT inhaler  Commonly known as:  PROVENTIL HFA;VENTOLIN HFA  Inhale 2 puffs into the lungs every 6 (six) hours as needed for wheezing or shortness of breath.     cyclobenzaprine 10 MG tablet  Commonly known as:  FLEXERIL  Take 10 mg by mouth 3 (three) times daily as needed for muscle spasms.     docusate sodium 100 MG capsule  Commonly known as:  COLACE  Take 100 mg by mouth 2 (two) times daily.     doxycycline 100 MG EC tablet  Commonly known as:  DORYX  Take 1 tablet (100 mg total) by mouth 2 (two) times daily.     HYDROcodone-acetaminophen 5-325 MG tablet  Commonly known as:  NORCO  Take 1-2 tablets by mouth every 6 (six) hours as needed for moderate pain or severe pain.     lisinopril-hydrochlorothiazide 20-12.5 MG tablet  Commonly known as:  PRINZIDE,ZESTORETIC  Take 1 tablet by mouth daily.     metFORMIN 500 MG tablet  Commonly known as:   GLUCOPHAGE  Take 500 mg by mouth daily.     metoprolol succinate 25 MG 24 hr tablet  Commonly known as:  TOPROL-XL  Take 25 mg by mouth daily.     pantoprazole 40 MG tablet  Commonly known as:  PROTONIX  Take 1 tablet (40 mg total) by mouth 2 (two) times daily.     PRESERVISION AREDS 2 Caps  Take 2 capsules by mouth daily.     simvastatin 20 MG tablet  Commonly known as:  ZOCOR  Take 20 mg by mouth daily.     tamsulosin 0.4 MG Caps capsule  Commonly known as:  FLOMAX  Take 0.4 mg by mouth daily.       No Known Allergies Follow-up Information    Follow up with Neil Crouch, PA-C On 02/01/2016.   Specialty:  Gastroenterology   Why:  @ 10:30 AM   Contact information:   [address] [place] [address] [phone]       Follow up with [facility] On 01/20/2016.   Specialty:  Radiology   Why:  arrive @ 8:15 AM. Please do not eat or drink after midnight the night before.   Contact information:   [id number] [place] [phone]       The results of significant diagnostics from this hospitalization (including imaging, microbiology, ancillary and laboratory) are listed below for reference.    Significant Diagnostic Studies: Dg Chest Portable 1 View  12/31/2015  CLINICAL DATA:  Cough, congestion EXAM: PORTABLE CHEST 1 VIEW COMPARISON:  None. FINDINGS: Cardiomegaly with vascular congestion. Minimal by the basilar opacities, likely atelectasis. No overt edema or effusions. IMPRESSION: Cardiomegaly with vascular congestion and bibasilar atelectasis. Electronically Signed   By: Rolm Baptise M.D.   On: 12/31/2015 16:31    Microbiology: Recent Results (from the past 240 hour(s))  MRSA PCR Screening     Status: None   Collection Time: 12/31/15  6:58 PM  Result Value Ref Range Status   MRSA by PCR NEGATIVE NEGATIVE Final    Comment:        The GeneXpert MRSA Assay (FDA approved for  NASAL specimens only), is one component of a comprehensive MRSA colonization surveillance program. It is not intended to diagnose MRSA infection nor to guide or monitor treatment for MRSA infections.      Labs: Basic Metabolic Panel:  Recent Labs Lab 12/31/15 1550 01/02/16 0518 01/04/16 0428  NA 137 137 139  K 3.8 4.9 4.2  CL 97* 107 106  CO2 32 25 27  GLUCOSE 133* 169* 94  BUN 40* 29* 13  CREATININE 1.54* 1.11 0.96  CALCIUM 9.6 8.5* 8.7*   Liver Function Tests:  Recent Labs Lab 12/31/15 1550 01/02/16 0518  AST 62* 55*  ALT 27 26  ALKPHOS 58 42  BILITOT 0.7 0.4  PROT 6.2* 4.9*  ALBUMIN 3.3* 2.5*   No results for input(s): LIPASE, AMYLASE in the last 168 hours. No results for input(s): AMMONIA in the last 168 hours. CBC:  Recent Labs Lab 12/31/15 1550  01/02/16 2349 01/03/16 0411 01/03/16 0801 01/04/16 0428 01/05/16 0424  WBC 19.7*  < > 16.2* 17.1* 14.4* 15.5* 12.2*  NEUTROABS 15.9*  --   --   --   --   --   --   HGB 10.1*  < > 9.1* 9.9* 9.7* 8.9* 9.0*  HCT 29.4*  < > 26.2* 28.5* 28.6* 26.0* 27.0*  MCV 89.4  < > 87.0 87.2 87.5 89.0 88.8  PLT 314  < > 205 236 204 236 260  < > = values in this interval not displayed. Cardiac Enzymes: No results for input(s): CKTOTAL, CKMB, CKMBINDEX, TROPONINI in the last 168 hours. BNP: BNP (last 3 results) No results for input(s): BNP in the last 8760 hours.  ProBNP (last 3 results) No results for input(s): PROBNP in the last 8760 hours.  CBG:  Recent Labs Lab 01/04/16 1133 01/04/16 1624 01/04/16 2145 01/05/16 0803 01/05/16 1134  GLUCAP 123* 105* 90 88 103*       Signed:  [facility] Pager: [phone] 01/05/2016, 11:50 AM

## 2016-01-05 NOTE — Telephone Encounter (Signed)
Nurse Justice Rocher in ICU is aware of appt. Will relay to pt.

## 2016-01-05 NOTE — Progress Notes (Signed)
Patient given discharge instructions with all questions answered. Faxed over prescriptions per patient request. Patient left facility in stable condition via personal vehicle. Patient assisted to emergency room entrance via wheelchair by NT and drove self home.

## 2016-01-05 NOTE — Telephone Encounter (Signed)
No PA required for CTE. Pt is set for CTE on 01/20/2016 @ 09:15am.  Pt needs to be fasting.

## 2016-01-05 NOTE — Telephone Encounter (Signed)
1. Patient needs abd xray to make sure givens capsule passed IF not done prior to his discharge. Can do Friday or Monday. 2. He needs CT enterography in 2 weeks Dx: GI bleeding, complete evaluation of small bowel (capsule study incomplete).  3. Return to the office to see Dr. [name], Vicente Males, or myself in 4-6 weeks.

## 2016-01-05 NOTE — Care Management Note (Signed)
Case Management Note  Patient Details  Name: [facility] MRN: [id number] Date of Birth: [birth year]  Expected Discharge Date:                  Expected Discharge Plan:  Home/Self Care  In-House Referral:  NA  Discharge planning Services  CM Consult  Post Acute Care Choice:  NA Choice offered to:  NA  DME Arranged:    DME Agency:     HH Arranged:    [place] Agency:     Status of Service:  Completed, signed off  Medicare Important Message Given:  Yes Date Medicare IM Given:    Medicare IM give by:    Date Additional Medicare IM Given:    Additional Medicare Important Message give by:     If discussed at [place] of Stay Meetings, dates discussed:    Additional Comments: Pt discharged home today with self care. Pt will f/u with Dr. Jani Gravel as he has taken of Dr. Everette Rank' practice.   Sherald Barge, RN 01/05/2016, 3:52 PM

## 2016-01-05 NOTE — Telephone Encounter (Signed)
Called and [facility] for pt to go for xray. No appt needed.

## 2016-01-05 NOTE — Telephone Encounter (Signed)
Patient was discharged today. He needs abd xray Friday or Monday to make sure Givens capsule passed. Please arrange.

## 2016-01-05 NOTE — Telephone Encounter (Signed)
Pt needs to arrive at xray at [facility] for Ct. Nurse aware and will relay message

## 2016-01-05 NOTE — Progress Notes (Signed)
REVIEWED-NO ADDITIONAL RECOMMENDATIONS.  Subjective:  Wants to go home. Two stools, green since last night. No signs of overt GI bleeding. No GI complaints.   Objective: Vital signs in last 24 hours: Temp:  [97.1 F (36.2 C)-98 F (36.7 C)] 97.9 F (36.6 C) (01/11 0400) Pulse Rate:  [62-89] 62 (01/11 0600) Resp:  [11-23] 14 (01/11 0600) BP: (111-160)/(38-82) 141/50 mmHg (01/11 0600) SpO2:  [92 %-99 %] 93 % (01/11 0600) Weight:  [227 lb 1.2 oz (103 kg)] 227 lb 1.2 oz (103 kg) (01/11 0500) Last BM Date: 01/04/16 General:   Alert,  Well-developed, well-nourished, pleasant and cooperative in NAD Head:  Normocephalic and atraumatic. Eyes:  Sclera clear, no icterus.  Abdomen:  Soft, nontender and nondistended. Extremities:  Without clubbing, deformity or edema. Neurologic:  Alert and  oriented x4;  grossly normal neurologically. Skin:  Intact without significant lesions or rashes. Psych:  Alert and cooperative. Normal mood and affect.  Intake/Output from previous day: 01/10 0701 - 01/11 0700 In: 2955 [P.O.:960; I.V.:1045; IV Piggyback:950] Out: 3900 [Urine:3900] Intake/Output this shift:    Lab Results: CBC  Recent Labs  01/03/16 0801 01/04/16 0428 01/05/16 0424  WBC 14.4* 15.5* 12.2*  HGB 9.7* 8.9* 9.0*  HCT 28.6* 26.0* 27.0*  MCV 87.5 89.0 88.8  PLT 204 236 260   BMET  Recent Labs  01/04/16 0428  NA 139  K 4.2  CL 106  CO2 27  GLUCOSE 94  BUN 13  CREATININE 0.96  CALCIUM 8.7*   LFTs No results for input(s): BILITOT, BILIDIR, IBILI, ALKPHOS, AST, ALT, PROT, ALBUMIN in the last 72 hours. No results for input(s): LIPASE in the last 72 hours. PT/INR No results for input(s): LABPROT, INR in the last 72 hours.    Imaging Studies: Dg Chest Portable 1 View  12/31/2015  CLINICAL DATA:  Cough, congestion EXAM: PORTABLE CHEST 1 VIEW COMPARISON:  None. FINDINGS: Cardiomegaly with vascular congestion. Minimal by the basilar opacities, likely atelectasis. No overt  edema or effusions. IMPRESSION: Cardiomegaly with vascular congestion and bibasilar atelectasis. Electronically Signed   By: Rolm Baptise M.D.   On: 12/31/2015 16:31  [2 weeks]   Assessment: 75 year old male with lower GI bleed, historically taking NSAIDs. EGD with multiple gastric and duodenal erosions likely secondary to NSAIDs, s/p biopsy. 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 Polypectmoy with piecemeal removal and tattooing. Active bleeding seen to be arising from the small bowel. Capsule study incomplete as capsule never made it to cecum. Suspected GI bleeding secondary to AVMs in setting of NSAIDs.   Plan: 1. No NSAIDS. 2. Advance diet.  3. Needs abdominal xray no sooner than 48 hours from time of capsule study to document passage of capsule. Can be done as outpatient if patient discharged today. Otherwise obtain tomorrow. 4. CTE as outpatient to further evaluate and assess for possible occult small bowel lesion that may not have been appreciated due to lack of complete study. 5. From GI standpoint, patient is stable for discharge. Will sign off.   Laureen Ochs. Bernarda Caffey [facility] [phone] 1/11/20179:44 AM     LOS: 5 days

## 2016-01-05 NOTE — Telephone Encounter (Signed)
appt made and icu nurse aware of date and time

## 2016-01-06 ENCOUNTER — Encounter: Payer: Self-pay | Admitting: Internal Medicine

## 2016-01-11 NOTE — Telephone Encounter (Signed)
Let's make sure patient has his abd xray.

## 2016-01-13 NOTE — Telephone Encounter (Signed)
He needs abd xray prior to his CTE on 01/20/16. Please see if he can go today or tomorrow.

## 2016-01-13 NOTE — Telephone Encounter (Signed)
Talked with patient and he will go tomorrow for the x-ray

## 2016-01-14 NOTE — Telephone Encounter (Signed)
Noted. Thanks.

## 2016-01-20 ENCOUNTER — Ambulatory Visit (HOSPITAL_COMMUNITY)
Admit: 2016-01-20 | Discharge: 2016-01-20 | Disposition: A | Discharge status: Home / Self Care | Payer: Medicare Other | Attending: Gastroenterology | Admitting: Gastroenterology

## 2016-01-20 ENCOUNTER — Ambulatory Visit (HOSPITAL_COMMUNITY)
Admission: RE | Admit: 2016-01-20 | Discharge: 2016-01-20 | Disposition: A | Discharge status: Home / Self Care | Payer: Medicare Other | Source: Ambulatory Visit | Attending: Gastroenterology | Admitting: Gastroenterology

## 2016-01-20 DIAGNOSIS — M4696 Unspecified inflammatory spondylopathy, lumbar region: Secondary | ICD-10-CM | POA: Diagnosis not present

## 2016-01-20 DIAGNOSIS — E278 Other specified disorders of adrenal gland: Secondary | ICD-10-CM | POA: Insufficient documentation

## 2016-01-20 DIAGNOSIS — K922 Gastrointestinal hemorrhage, unspecified: Secondary | ICD-10-CM

## 2016-01-20 DIAGNOSIS — K5730 Diverticulosis of large intestine without perforation or abscess without bleeding: Secondary | ICD-10-CM | POA: Diagnosis not present

## 2016-01-20 DIAGNOSIS — M4186 Other forms of scoliosis, lumbar region: Secondary | ICD-10-CM | POA: Diagnosis not present

## 2016-01-20 DIAGNOSIS — R911 Solitary pulmonary nodule: Secondary | ICD-10-CM | POA: Diagnosis not present

## 2016-01-20 DIAGNOSIS — I2510 Atherosclerotic heart disease of native coronary artery without angina pectoris: Secondary | ICD-10-CM | POA: Insufficient documentation

## 2016-01-20 DIAGNOSIS — I700 Atherosclerosis of aorta: Secondary | ICD-10-CM | POA: Insufficient documentation

## 2016-01-20 MED ORDER — IOHEXOL 300 MG/ML  SOLN
100.0000 mL | Freq: Once | INTRAMUSCULAR | Status: AC | PRN
  Administered 2016-01-20: 125 mL via INTRAVENOUS

## 2016-01-20 MED ORDER — BARIUM SULFATE 0.1 % PO SUSP: 450.0000 mL | Freq: Once | ORAL | Status: DC

## 2016-01-30 NOTE — Progress Notes (Signed)
Quick Note:  Several small things on CTE.  -No masses or significant abnormalities of small bowel or colon on CTE. -atherosclerotic calcifications of aorta, branch vessels. And coronary arteries. Recommend cardiology evaluation or he can discuss with his PCP. Send copy to PCP or to cardiology, whichever he prefers. -prostate gland enlargement with compression on base of bladder. Consider discussing with PCP or urology. Send copy to PCP or urology. Whichever he prefers. -42mm left lower lobe pulmonary nodule and adrenal gland nodules (bilateral). Noncontrast CT chest and abdomen in 4 months per radiologist to ensure stability. Please NIC. -recommend CBC now ______

## 2016-01-30 NOTE — Progress Notes (Signed)
Quick Note:  Please call radiology and have them comment on whether or not they small persistent Givens small bowel capsule. That was the reason for the study, not sure where the "gross hematuria" came from. ______

## 2016-02-01 ENCOUNTER — Encounter: Payer: Self-pay | Admitting: Gastroenterology

## 2016-02-01 ENCOUNTER — Ambulatory Visit (INDEPENDENT_AMBULATORY_CARE_PROVIDER_SITE_OTHER): Payer: Medicare Other | Admitting: Gastroenterology

## 2016-02-01 VITALS — BP 157/76 | HR 74 | Temp 97.6°F | Ht 70.0 in | Wt 217.6 lb

## 2016-02-01 DIAGNOSIS — K922 Gastrointestinal hemorrhage, unspecified: Secondary | ICD-10-CM | POA: Diagnosis not present

## 2016-02-01 DIAGNOSIS — R911 Solitary pulmonary nodule: Secondary | ICD-10-CM | POA: Insufficient documentation

## 2016-02-01 DIAGNOSIS — D500 Iron deficiency anemia secondary to blood loss (chronic): Secondary | ICD-10-CM | POA: Insufficient documentation

## 2016-02-01 DIAGNOSIS — R9389 Abnormal findings on diagnostic imaging of other specified body structures: Secondary | ICD-10-CM

## 2016-02-01 DIAGNOSIS — K635 Polyp of colon: Secondary | ICD-10-CM | POA: Diagnosis not present

## 2016-02-01 DIAGNOSIS — R938 Abnormal findings on diagnostic imaging of other specified body structures: Secondary | ICD-10-CM

## 2016-02-01 MED ORDER — FERROUS SULFATE 325 (65 FE) MG PO TABS: 325.0000 mg | ORAL_TABLET | Freq: Two times a day (BID) | ORAL | Status: DC

## 2016-02-01 NOTE — Patient Instructions (Signed)
1. Take iron twice daily (ferrous sulfate 325mg  twice daily) with food. 2. Continue pantoprazole twice daily for your stomach. 3. I recommend you discuss CT findings with Dr. Maudie Mercury, ie calcifications in the aorta and coronary arteries, large prostate. I will send him a copy and letter.  4. We will remind you to have your labs in 3 months, Chest/Abd CT in four months, and colonoscopy in 06/2016. 5. Call us if you notice black or bloody stools, weakness, lightheadedness, worsening shortness of breath. These could be symptoms of significant anemia (low blood count). 6. Return to the office in 05/2016.

## 2016-02-01 NOTE — Progress Notes (Signed)
Primary Care Physician:  Jani Gravel, MD  Primary Gastroenterologist:  Garfield Cornea, MD   Chief Complaint  Patient presents with  . Follow-up    HPI:  Todd George is a 75 y.o. male here for hospital follow up. Presented first part of January with dark red bloody bowel movement. History of naproxen 500 mg twice a day chronically.EGD without significant findings and colonoscopy revealing active bleeding arising from the small bowel. Multiple polyps removed at time of colonoscopy with cecal/ascending colon AVMs non-bleeding but ablated. Small bowel capsule endoscopy on January 10 incomplete. Capsule never reach the cecum. Scattered erosions, AVMs throughout the small bowel. Several appear to be using.  Underwent a CTE to evaluate remaining portion of small bowel. Pertinent Findings as outlined below.  -No masses or significant abnormalities of small bowel or colon on CTE. -atherosclerotic calcifications of aorta, branch vessels. And coronary arteries.  -prostate gland enlargement with compression on base of bladder. C -109mm left lower lobe pulmonary nodule and adrenal gland nodules (bilateral). Noncontrast CT chest and abdomen in 4 months per radiologist to ensure stability. Please NIC.  Patient states he had been on naproxen twice a day for years. Difficult to function without it due to arthritic pain. From a GI standpoint he feels well. Denies abdominal pain, heartburn, constipation, diarrhea, melena, rectal bleeding. He wonders if he could try Mobic when necessary.  Current Outpatient Prescriptions  Medication Sig Dispense Refill  . albuterol (PROVENTIL HFA;VENTOLIN HFA) 108 (90 Base) MCG/ACT inhaler Inhale 2 puffs into the lungs every 6 (six) hours as needed for wheezing or shortness of breath. 1 Inhaler 2  . cyclobenzaprine (FLEXERIL) 10 MG tablet Take 10 mg by mouth 3 (three) times daily as needed for muscle spasms.    Marland Kitchen docusate sodium (COLACE) 100 MG capsule Take 100 mg by mouth 2  (two) times daily.    . metFORMIN (GLUCOPHAGE) 500 MG tablet Take 500 mg by mouth daily.     . metoprolol succinate (TOPROL-XL) 25 MG 24 hr tablet Take 25 mg by mouth daily.    . Multiple Vitamins-Minerals (PRESERVISION AREDS 2) CAPS Take 2 capsules by mouth daily.    . pantoprazole (PROTONIX) 40 MG tablet Take 1 tablet (40 mg total) by mouth 2 (two) times daily. 60 tablet 2  . simvastatin (ZOCOR) 20 MG tablet Take 20 mg by mouth daily.    . tamsulosin (FLOMAX) 0.4 MG CAPS capsule Take 0.4 mg by mouth daily.      No current facility-administered medications for this visit.    Allergies as of 02/01/2016  . (No Known Allergies)    Past Medical History  Diagnosis Date  . Hyperlipidemia   . Cellulitis   . BPH (benign prostatic hyperplasia)   . Chronic back pain   . Carotid artery disease ([place])   . COPD (chronic obstructive pulmonary disease) ([place])     2ppd  . OSA on CPAP     not used machine in over 10 yrs  . Diabetes mellitus without complication ([place])   . Hypertension   . Duodenal erosion 01/02/2016  . Gastric erosion with bleeding 01/02/2016  . Colon polyps 01/04/2016  . 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 Replacement total knee Left   . Cholecystectomy    . Joint replacement    . Throat surgery  1965  . Shoulder arthroscopy with subacromial decompression Right 05/28/2015    Procedure: SHOULDER ARTHROSCOPY  WITH SUBACROMIAL DECOMPRESSION;  Surgeon: Melrose Nakayama, MD;  Location: [facility];  Service: Orthopedics;  Laterality: Right;  . Shoulder arthroscopy with distal clavicle resection Right 05/28/2015    Procedure: SHOULDER ARTHROSCOPY WITH DISTAL CLAVICLE RESECTION;  Surgeon: Melrose Nakayama, MD;  Location: [facility];  Service: Orthopedics;  Laterality: Right;  . Givens capsule study N/A 01/03/2016    Procedure: GIVENS CAPSULE STUDY;  Surgeon: Daneil Dolin, MD;   [facility];  Service: Endoscopy;  Laterality: N/A;  PLEASE PLACE AT 2000 this evening  . Colonoscopy N/A 01/03/2016    Procedure: COLONOSCOPY;  Surgeon: Daneil Dolin, MD;  [facility];  Service: Endoscopy;  Laterality: N/A;  . Esophagogastroduodenoscopy N/A 01/01/2016    Procedure: ESOPHAGOGASTRODUODENOSCOPY (EGD);  Surgeon: Daneil Dolin, MD;  [facility];  Service: Endoscopy;  Laterality: N/A;  10 AM January 7    No family history on file.  Social History   Social History  . Marital Status: Divorced    Spouse Name: N/A  . Number of Children: N/A  . Years of Education: N/A   Occupational History  . Not on file.   Social History Main Topics  . Smoking status: Current Every Day Smoker -- 2.00 packs/day  . Smokeless tobacco: Not on file  . Alcohol Use: Yes     Comment: social  . Drug Use: No  . Sexual Activity: No   Other Topics Concern  . Not on file   Social History Narrative      ROS:  General: Negative for anorexia, weight loss, fever, chills, fatigue, weakness. Eyes: Negative for vision changes.  ENT: Negative for hoarseness, difficulty swallowing , nasal congestion. CV: Negative for chest pain, angina, palpitations, dyspnea on exertion, peripheral edema.  Respiratory: Negative for dyspnea at rest, dyspnea on exertion, cough, sputum, wheezing.  GI: See history of present illness. GU:  Negative for dysuria, hematuria, urinary incontinence, urinary frequency, nocturnal urination.  MS: Negative for joint pain, low back pain.  Derm: Negative for rash or itching.  Neuro: Negative for weakness, abnormal sensation, seizure, frequent headaches, memory loss, confusion.  Psych: Negative for anxiety, depression, suicidal ideation, hallucinations.  Endo: Negative for unusual weight change.  Heme: Negative for bruising or bleeding. Allergy: Negative for rash or hives.    Physical Examination:  BP 157/76 mmHg  Pulse 74  Temp(Src) 97.6  F (36.4 C) (Oral)  Ht 5\' 10"  (1.778 m)  Wt 217 lb 9.6 oz (98.703 kg)  BMI 31.22 kg/m2   General: Well-nourished, well-developed in no acute distress.  Head: Normocephalic, atraumatic.   Eyes: Conjunctiva pink, no icterus. Mouth: Oropharyngeal mucosa moist and pink , no lesions erythema or exudate. Neck: Supple without thyromegaly, masses, or lymphadenopathy.  Lungs: Clear to auscultation bilaterally.  Heart: Regular rate and rhythm, no murmurs rubs or gallops.  Abdomen: Bowel sounds are normal, nontender, nondistended, no hepatosplenomegaly or masses, no abdominal bruits or    hernia , no rebound or guarding.   Rectal: Not performed Extremities: No lower extremity edema. No clubbing or deformities.  Neuro: Alert and oriented x 4 , grossly normal neurologically.  Skin: Warm and dry, no rash or jaundice.   Psych: Alert and cooperative, normal mood and affect.  Labs: Lab Results  Component Value Date   WBC 12.2* 01/05/2016   HGB 9.0* 01/05/2016   HCT 27.0* 01/05/2016   MCV 88.8 01/05/2016   PLT 260 01/05/2016   Lab Results  Component Value  Date   CREATININE 0.96 01/04/2016   BUN 13 01/04/2016   NA 139 01/04/2016   K 4.2 01/04/2016   CL 106 01/04/2016   CO2 27 01/04/2016   Lab Results  Component Value Date   ALT 26 01/02/2016   AST 55* 01/02/2016   ALKPHOS 42 01/02/2016   BILITOT 0.4 01/02/2016   No results found for: IRON, TIBC, FERRITIN   Imaging Studies: Dg Abd 1 View  01/20/2016  CLINICAL DATA:  Gross hematuria EXAM: ABDOMEN - 1 VIEW COMPARISON:  None. FINDINGS: There is no bowel dilatation or air-fluid level suggesting obstruction. No free air. There is moderate stool in the colon. There are probable phleboliths in the pelvis. There are foci of arterial vascular calcification in the pelvis. There is degenerative change in the lumbar spine with lumbar levoscoliosis. There are surgical clips in the right upper quadrant region. IMPRESSION: Probable phleboliths in the  pelvis. Arterial vascular calcifications are noted. No other abnormal calcifications. Bowel gas pattern unremarkable. Lumbar spine arthropathy and scoliosis. Electronically Signed   By: Lowella Grip III M.D.   On: 01/20/2016 08:28   Ct Entero Abd/pelvis W/cm  01/20/2016  CLINICAL DATA:  Gastrointestinal hemorrhage. Bloody stools since 01/05/2016. EXAM: CT ABDOMEN AND PELVIS WITH CONTRAST (ENTEROGRAPHY) TECHNIQUE: Multidetector CT of the abdomen and pelvis during bolus administration of intravenous contrast. Negative oral contrast VoLumen was given. CONTRAST:  131mL OMNIPAQUE IOHEXOL 300 MG/ML  SOLN COMPARISON:  None. FINDINGS: Lower chest: The lung bases are clear. No pleural effusion and or pulmonary infiltrates. Possible small irregular nodular density at the left lung base on image number 11 measuring approximately 8 mm. This could be scar tissue. Followup will be necessary. The heart is normal in size. It is deviated to the left by a mild pectus deformity. No pericardial effusion. Three-vessel coronary artery calcifications are noted. The distal esophagus is normal. Hepatobiliary: No focal hepatic lesions or intrahepatic biliary dilatation. Mild common bile duct dilatation due to prior cholecystectomy. Pancreas: No mass, inflammation or ductal dilatation. Spleen: Normal size.  No focal lesions. Adrenals/Urinary Tract: Bilateral adrenal gland nodules are likely adenomas. Multiple bilateral renal cysts. No worrisome renal lesions. No hydronephrosis or obstructing ureteral calculi. No bladder calculi. Small bladder diverticuli. Prostate gland enlargement with median lobe hypertrophy impressing on the base of the bladder. Stomach/Bowel: The stomach, duodenum, small bowel and colon are unremarkable. I do not see any areas of abnormal contrast enhancement to suggest inflammation or mass. The terminal ileum is normal. The appendix is normal. The colon does not contain contrast but I do not see any abnormal wall  thickening or discrete mass. There is significant descending and sigmoid diverticulosis without findings for acute diverticulitis. Vascular/Lymphatic: No mesenteric or retroperitoneal mass or lymphadenopathy. Advanced atherosclerotic calcifications involving the aorta and iliac arteries and branch vessel ostia. No aneurysm or dissection. Other: No pelvic mass or adenopathy. No free pelvic fluid collections. No inguinal mass or adenopathy. Prominent inguinal rings containing fat. Musculoskeletal: Advanced degenerative changes involving the spine but no acute bony findings. IMPRESSION: 1. No CT findings to account for the patient's gastrointestinal bleeding. No mass or inflammatory process is identified. 2. Moderate descending and sigmoid diverticulosis but no findings for acute diverticulitis. 3. Bilateral adrenal gland nodules are likely benign adenomas. 4. Advanced atherosclerotic calcifications involving the aorta, branch vessels and coronary arteries. 5. Multiple bilateral renal cysts. 6. Prostate gland enlargement with median lobe hypertrophy impressing on the base the bladder. 7. 8 mm left lower lobe pulmonary nodule. 8.  Would recommend a followup noncontrast CT examination of the chest and abdomen in 4-6 months to re-evaluate the left lower lobe pulmonary nodule and the rest of the lungs and also to hopefully show that the adrenal lesions are benign adenomas. Electronically Signed   By: Marijo Sanes M.D.   On: 01/20/2016 10:41

## 2016-02-02 DIAGNOSIS — R9389 Abnormal findings on diagnostic imaging of other specified body structures: Secondary | ICD-10-CM | POA: Insufficient documentation

## 2016-02-02 NOTE — Assessment & Plan Note (Signed)
Recent CT scan with calcifications in the aorta, coronary arteries. Encouraged patient to establish care with a cardiologist. He states he will discuss this with his PCP, Dr. Maudie Mercury. We will forward this information to Dr. Maudie Mercury.  In addition he also had significantly enlarged prostate with impression on the bladder noted on CT, recommend urology consultation if Dr. Maudie Mercury feels is appropriate. Again patient prefers to follow-up with Dr. Maudie Mercury regarding these findings.

## 2016-02-02 NOTE — Assessment & Plan Note (Addendum)
Anemia related to GI blood loss in the setting of small bowel and colonic AVMs. Recommend avoiding NSAIDs as much as possible. Patient reports that he is nonfunctional without anti-inflammatories because of severe arthritis. Likely will have recurrent bleeding in the setting NSAIDs but if he must be on any, would try a different class and that the lowest dose tolerable. Recommend ferrous sulfate 325 mg twice a day. Encourage periodic CBC to verify stability. Report any overt GI bleeding. We'll recheck his labs in 3 months. If he is unable to maintain his hemoglobin, would consider referral to [facility] for double balloon position enteroscopy.

## 2016-02-02 NOTE — Assessment & Plan Note (Signed)
Short interval surveillance colonoscopy due in July 2017. He will come back to the office in June to schedule.

## 2016-02-02 NOTE — Assessment & Plan Note (Signed)
Noncontrast CT of the chest and abdomen in 4 months per radiology recommendations.

## 2016-02-02 NOTE — Progress Notes (Signed)
cc'ed to pcp °

## 2016-02-03 ENCOUNTER — Encounter: Payer: Self-pay | Admitting: Gastroenterology

## 2016-02-10 ENCOUNTER — Encounter: Payer: Self-pay | Admitting: Pulmonary Disease

## 2016-02-10 ENCOUNTER — Ambulatory Visit (INDEPENDENT_AMBULATORY_CARE_PROVIDER_SITE_OTHER): Payer: Medicare Other | Admitting: Pulmonary Disease

## 2016-02-10 VITALS — BP 164/70 | HR 85 | Wt 218.6 lb

## 2016-02-10 DIAGNOSIS — R911 Solitary pulmonary nodule: Secondary | ICD-10-CM

## 2016-02-10 DIAGNOSIS — E279 Disorder of adrenal gland, unspecified: Secondary | ICD-10-CM

## 2016-02-10 NOTE — Progress Notes (Signed)
Subjective:    Patient ID: Todd George, male    DOB: 14-Apr-1941, 75 y.o.   MRN: [id number]  HPI Consult for evaluation of abnormal CT scan.  Todd George is a 75 year old with past medical history as below. He was hospitalized from 77/6/17 to 01/05/16 with lower GI bleed. He underwent eval by GI and was found to have small bowel/colonic AVMs. He had a CT of the abdomen pelvis which showed an incidental finding of left lower lobe 8 mm nodule.  He is a heavy smoker. He continues to smoke 2 packs per day. He has some cough, dyspnea on exertion at baseline. But this does not appear to bother him too much. He denies any fevers, chills, malaise, loss of weight, loss of appetite, hemoptysis. He had been prescribed albuterol rescue inhaler but is not taking it regularly.  DATA: CT abd, pelvis 12/31/15 Descending colon, Sigmoid diverticulosis Bilateral adrenal nodules, advanced coronary calcification, renal cysts, prostate enlargement. 8 mm left lower lobe nodule.  Social History: 2 pack per day smoker for 65 years. He continues to smoke. Rare alcohol use, no illegal drug use. 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  Family history: Brother-Parkinsons, alcohol abuse.  Past Medical History  Diagnosis Date  . Hyperlipidemia   . Cellulitis   . BPH (benign prostatic hyperplasia)   . Chronic back pain   . Carotid artery disease ([place])   . COPD (chronic obstructive pulmonary disease) ([place])     2ppd  . OSA on CPAP     not used machine in over 10 yrs  . Diabetes mellitus without complication ([place])   . Hypertension   . Duodenal erosion 01/02/2016  . Gastric erosion with bleeding 01/02/2016  . Colon polyps 01/04/2016  . 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     Current outpatient prescriptions:  .  cyclobenzaprine (FLEXERIL) 10 MG tablet, Take 10 mg by mouth 3 (three) times daily as needed for muscle  spasms., Disp: , Rfl:  .  docusate sodium (COLACE) 100 MG capsule, Take 100 mg by mouth 2 (two) times daily., Disp: , Rfl:  .  ferrous sulfate 325 (65 FE) MG tablet, Take 1 tablet (325 mg total) by mouth 2 (two) times daily with a meal., Disp: 60 tablet, Rfl: 3 .  metFORMIN (GLUCOPHAGE) 500 MG tablet, Take 500 mg by mouth daily. , Disp: , Rfl:  .  metoprolol succinate (TOPROL-XL) 25 MG 24 hr tablet, Take 25 mg by mouth daily., Disp: , Rfl:  .  Multiple Vitamins-Minerals (PRESERVISION AREDS 2) CAPS, Take 2 capsules by mouth daily., Disp: , Rfl:  .  oxybutynin (DITROPAN-XL) 5 MG 24 hr tablet, Take 5 mg by mouth at bedtime., Disp: , Rfl:  .  pantoprazole (PROTONIX) 40 MG tablet, Take 1 tablet (40 mg total) by mouth 2 (two) times daily., Disp: 60 tablet, Rfl: 2 .  simvastatin (ZOCOR) 20 MG tablet, Take 20 mg by mouth daily., Disp: , Rfl:  .  tamsulosin (FLOMAX) 0.4 MG CAPS capsule, Take 0.4 mg by mouth daily. , Disp: , Rfl:  .  albuterol (PROVENTIL HFA;VENTOLIN HFA) 108 (90 Base) MCG/ACT inhaler, Inhale 2 puffs into the lungs every 6 (six) hours as needed for wheezing or shortness of breath. (Patient not taking: Reported on 02/10/2016), Disp: 1 Inhaler, Rfl: 2   Review of Systems Dyspnea on exertion, cough, sputum production. No wheeze, month to assess. No  fevers, chills, loss of weight or loss of appetite, malaise, fatigue. No chest pain, palpitation. No nausea, vomiting, diarrhea, constipation. All other review of systems are negative    Objective:   Physical Exam Blood pressure 164/70, pulse 85, weight 218 lb 9.6 oz (99.156 kg), SpO2 94 %. Gen: No apparent distress Neuro: No gross focal deficits. Neck: No JVD, lymphadenopathy, thyromegaly. RS: Clear, no wheeze, crackles CVS: S1-S2 heard, no murmurs rubs gallops. Abdomen: Soft, positive bowel sounds. Extremities: No edema.    Assessment & Plan:  #1 Pulmonary nodule. 8 mm This concerning for malignancy in the heavy smoker. He'll be  scheduled for follow-up CT scan in 3 months reevaluate. He also has adrenal nodule suggestive of an adenoma for which he will get a CT of the abdomen too. His coronary calcifications are suggestive of coronary artery disease. He will follow-up with Dr. Maudie Mercury his primary care physician regarding any referral to cardiology. I'll also get a set of pulmonary function tests. He probably has COPD due to heavy smoking however he is not interested in trying out any inhalers.  #2 Tobacco abuse. He is not interested in quitting smoking.  PLAN: - CT of chest and abd w/o contrast in 3 months.  Return to clinic after scan.  Marshell Garfinkel MD [facility] Pager [phone] If no answer or after 3pm call: [phone] 02/10/2016, 4:23 PM

## 2016-02-10 NOTE — Patient Instructions (Signed)
We will repeat a CT of the chest and abdomen in 3 months. We schedule you for lung function tests.  Return to clinic in 3 months after the CT scan.

## 2016-02-28 ENCOUNTER — Institutional Professional Consult (permissible substitution): Payer: Medicare Other | Admitting: Internal Medicine

## 2016-05-09 ENCOUNTER — Ambulatory Visit (HOSPITAL_COMMUNITY)
Admission: RE | Admit: 2016-05-09 | Discharge: 2016-05-09 | Disposition: A | Discharge status: Home / Self Care | Payer: Medicare Other | Source: Ambulatory Visit | Attending: Pulmonary Disease | Admitting: Pulmonary Disease

## 2016-05-09 DIAGNOSIS — D3501 Benign neoplasm of right adrenal gland: Secondary | ICD-10-CM | POA: Insufficient documentation

## 2016-05-09 DIAGNOSIS — R918 Other nonspecific abnormal finding of lung field: Secondary | ICD-10-CM | POA: Diagnosis not present

## 2016-05-09 DIAGNOSIS — E279 Disorder of adrenal gland, unspecified: Secondary | ICD-10-CM | POA: Diagnosis not present

## 2016-05-09 DIAGNOSIS — K5730 Diverticulosis of large intestine without perforation or abscess without bleeding: Secondary | ICD-10-CM | POA: Insufficient documentation

## 2016-05-09 DIAGNOSIS — R911 Solitary pulmonary nodule: Secondary | ICD-10-CM | POA: Diagnosis present

## 2016-05-09 DIAGNOSIS — N281 Cyst of kidney, acquired: Secondary | ICD-10-CM | POA: Insufficient documentation

## 2016-05-09 DIAGNOSIS — D3502 Benign neoplasm of left adrenal gland: Secondary | ICD-10-CM | POA: Insufficient documentation

## 2016-05-10 ENCOUNTER — Telehealth: Payer: Self-pay | Admitting: Internal Medicine

## 2016-05-10 NOTE — Telephone Encounter (Signed)
RECALL FOR CT [facility] ABD

## 2016-05-10 NOTE — Telephone Encounter (Signed)
Mailed letter °

## 2016-05-11 NOTE — Progress Notes (Signed)
Quick Note:  Called spoke with patient, advised of CT results / recs as stated by PM. Pt verbalized his understanding and denied any questions. ______ 

## 2016-05-25 ENCOUNTER — Ambulatory Visit: Payer: Medicare Other | Admitting: Pulmonary Disease

## 2016-06-01 ENCOUNTER — Encounter: Payer: Self-pay | Admitting: Gastroenterology

## 2016-06-01 ENCOUNTER — Other Ambulatory Visit: Payer: Self-pay

## 2016-06-01 ENCOUNTER — Ambulatory Visit (INDEPENDENT_AMBULATORY_CARE_PROVIDER_SITE_OTHER): Payer: Medicare Other | Admitting: Gastroenterology

## 2016-06-01 VITALS — BP 182/79 | HR 69 | Temp 96.9°F | Ht 70.0 in | Wt 213.6 lb

## 2016-06-01 DIAGNOSIS — K635 Polyp of colon: Secondary | ICD-10-CM

## 2016-06-01 DIAGNOSIS — Z86010 Personal history of colonic polyps: Secondary | ICD-10-CM

## 2016-06-01 DIAGNOSIS — R911 Solitary pulmonary nodule: Secondary | ICD-10-CM | POA: Diagnosis not present

## 2016-06-01 DIAGNOSIS — D500 Iron deficiency anemia secondary to blood loss (chronic): Secondary | ICD-10-CM | POA: Diagnosis not present

## 2016-06-01 MED ORDER — PEG 3350-KCL-NA BICARB-NACL 420 G PO SOLR: 4000.0000 mL | ORAL | Status: DC

## 2016-06-01 NOTE — Progress Notes (Signed)
cc'ed to pcp °

## 2016-06-01 NOTE — Assessment & Plan Note (Signed)
Due for short interval surveillance colonoscopy for carpet polyp as outlined above.  I have discussed the risks, alternatives, benefits with regards to but not limited to the risk of reaction to medication, bleeding, infection, perforation and the patient is agreeable to proceed. Written consent to be obtained.  Hold iron for 7 days prior procedure. Hold metformin day of prep.

## 2016-06-01 NOTE — Assessment & Plan Note (Signed)
Anemia related to GI blood loss in the setting of small bowel and colonic AVMs. Continue to avoid NSAIDs as much as possible. Continue iron supplement as before. He is due for labs in the next few weeks, will follow up on CBC that is being done at that time.

## 2016-06-01 NOTE — Assessment & Plan Note (Signed)
Noncontrast CT of the chest last month showed resolution of previously seen pulmonary nodule however he has a new left upper lobe nodule. Radiology recommended follow-up chest CT in one year, noted in computer for reminder. Unfortunately patient was following with a pulmonologist but he canceled his appointment for PFTs. Patient apprehensive in returning to their office.

## 2016-06-01 NOTE — Progress Notes (Signed)
Primary Care Physician:  Jani Gravel, MD  Primary Gastroenterologist:  Garfield Cornea, MD   Chief Complaint  Patient presents with  . Follow-up    HPI:  Todd George is a 75 y.o. male here For follow-up to schedule short interval colonoscopy he had a 1.5 cm carpet polyp lying in a valley between 2 folds of the ascending segment. Felt to be removed completely. Area was tattooed. Pathology revealed tubular adenoma. 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 Noted to have bloody effluent coming down from small bowel in the terminal ileum was intubated. Mucosa however appeared normal.  Patient has a history of melena, GI bleed with multiple gastric and duodenal erosions likely NSAID related, colonoscopy findings as outlined. Small bowel capsule endoscopy in January was incomplete. Capsule never reach the cecum. Scattered erosions, AVMs throughout the small bowel, several appear to be using oozing. CTE did not show any evidence of small bowel tumor or explain bleeding.  Since he was last here he did follow-up with the pulmonologist. He had a chest and abdominal CT, previously seen left lower lobe pulmonary nodule was no longer seen. New nodule present however. Adrenal glands. Be adenomas with no further workup needed. Noncontrast chest CT recommended in 12 months of the left upper lobe pulmonary nodule since he is a smoker.  BM regular. No melena, brbpr. Iron QOD. No heartburn/indigestion. No dysphagia. No n/v.Patient has a history of colonic polyps on every colonoscopy he has ever had (total of about 5). Denies abdominal pain.  Lab work planned at [facility]. in a few weeks. Includes CBC.  Current Outpatient Prescriptions  Medication Sig Dispense Refill  . cyclobenzaprine (FLEXERIL) 10 MG tablet Take 10 mg by mouth 3 (three) times daily as needed for muscle spasms.    Marland Kitchen docusate sodium (COLACE) 100 MG capsule Take 100 mg by mouth 2 (two) times daily.    .  ferrous sulfate 325 (65 FE) MG tablet Take 1 tablet (325 mg total) by mouth 2 (two) times daily with a meal. 60 tablet 3  . metFORMIN (GLUCOPHAGE) 500 MG tablet Take 500 mg by mouth daily.     . metoprolol succinate (TOPROL-XL) 25 MG 24 hr tablet Take 25 mg by mouth daily.    . Multiple Vitamins-Minerals (PRESERVISION AREDS 2) CAPS Take 2 capsules by mouth daily. Reported on 06/01/2016    . oxybutynin (DITROPAN-XL) 5 MG 24 hr tablet Take 5 mg by mouth at bedtime.    . pantoprazole (PROTONIX) 40 MG tablet Take 1 tablet (40 mg total) by mouth 2 (two) times daily. 60 tablet 2  . simvastatin (ZOCOR) 20 MG tablet Take 20 mg by mouth daily.    . tamsulosin (FLOMAX) 0.4 MG CAPS capsule Take 0.4 mg by mouth daily.      No current facility-administered medications for this visit.    Allergies as of 06/01/2016  . (No Known Allergies)    Past Medical History  Diagnosis Date  . Hyperlipidemia   . Cellulitis   . BPH (benign prostatic hyperplasia)   . Chronic back pain   . Carotid artery disease ([place])   . COPD (chronic obstructive pulmonary disease) ([place])     2ppd  . OSA on CPAP     not used machine in over 10 yrs  . Diabetes mellitus without complication ([place])   . Hypertension   . Duodenal erosion 01/02/2016  . Gastric erosion with bleeding 01/02/2016  . Colon polyps 01/04/2016  .  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 Replacement total knee Left   . Cholecystectomy    . Joint replacement    . Throat surgery  1965  . Shoulder arthroscopy with subacromial decompression Right 05/28/2015    Procedure: SHOULDER ARTHROSCOPY WITH SUBACROMIAL DECOMPRESSION;  Surgeon: Melrose Nakayama, MD;  Location: [facility];  Service: Orthopedics;  Laterality: Right;  . Shoulder arthroscopy with distal clavicle resection Right 05/28/2015    Procedure: SHOULDER ARTHROSCOPY WITH DISTAL CLAVICLE RESECTION;  Surgeon: Melrose Nakayama, MD;  Location: [facility];  Service: Orthopedics;  Laterality: Right;  . [place] capsule study N/A 01/03/2016    Incomplete study, capsule never reach cecum. Scattered erosions and AVMs throughout the small bowel.  . Colonoscopy N/A 01/03/2016    RMR: Active bleeding seen arising from the small bowel. Multiple cecal/ascending colon AVMs ablated. 1.5 cm carpet polyp in the ascending segment status post piecemeal snare polypectomy and tattooing. Innocent appearing colonic diverticulosis.  . Esophagogastroduodenoscopy N/A 01/01/2016    RMR: Multiple gastric and duodenal erosions likely NSAID related.    No family history on file.  Social History   Social History  . Marital Status: Divorced    Spouse Name: N/A  . Number of Children: N/A  . Years of Education: N/A   Occupational History  . Not on file.   Social History Main Topics  . Smoking status: Current Every Day Smoker -- 2.00 packs/day for 64 years  . Smokeless tobacco: Not on file  . Alcohol Use: 0.0 oz/week    0 Standard drinks or equivalent per week     Comment: social  . Drug Use: No  . Sexual Activity: No   Other Topics Concern  . Not on file   Social History Narrative   Divorced   Lives alone    [place] here from [place] in Jan 2016   Retired - Agricultural consultant      ROS:  General: Negative for anorexia, weight loss, fever, chills, fatigue, weakness. Eyes: Negative for vision changes.  ENT: Negative for hoarseness, difficulty swallowing , nasal congestion. CV: Negative for chest pain, angina, palpitations, dyspnea on exertion, peripheral edema.  Respiratory: Negative for dyspnea at rest, dyspnea on exertion, cough, sputum, wheezing.  GI: See history of present illness. GU:  Negative for dysuria, hematuria, urinary incontinence, urinary frequency, nocturnal urination.  MS: Negative for joint pain, low back pain.  Derm: Negative for rash or itching.  Neuro: Negative for weakness, abnormal  sensation, seizure, frequent headaches, memory loss, confusion.  Psych: Negative for anxiety, depression, suicidal ideation, hallucinations.  Endo: Negative for unusual weight change.  Heme: Negative for bruising or bleeding. Allergy: Negative for rash or hives.    Physical Examination:  BP 182/79 mmHg  Pulse 69  Temp(Src) 96.9 F (36.1 C)  Ht 5\' 10"  (1.778 m)  Wt 213 lb 9.6 oz (96.888 kg)  BMI 30.65 kg/m2   General: Well-nourished, well-developed in no acute distress.  Head: Normocephalic, atraumatic.   Eyes: Conjunctiva pink, no icterus. Mouth: Oropharyngeal mucosa moist and pink , no lesions erythema or exudate. Neck: Supple without thyromegaly, masses, or lymphadenopathy.  Lungs: Clear to auscultation bilaterally.  Heart: Regular rate and rhythm, no murmurs rubs or gallops.  Abdomen: Bowel sounds are normal, nontender, nondistended, no hepatosplenomegaly or masses, no abdominal bruits or    hernia , no rebound or guarding.   Rectal: Not performed Extremities: No  lower extremity edema. No clubbing or deformities.  Neuro: Alert and oriented x 4 , grossly normal neurologically.  Skin: Warm and dry, no rash or jaundice.   Psych: Alert and cooperative, normal mood and affect.  Labs: Lab Results  Component Value Date   WBC 12.2* 01/05/2016   HGB 9.0* 01/05/2016   HCT 27.0* 01/05/2016   MCV 88.8 01/05/2016   PLT 260 01/05/2016   Lab Results  Component Value Date   CREATININE 0.96 01/04/2016   BUN 13 01/04/2016   NA 139 01/04/2016   K 4.2 01/04/2016   CL 106 01/04/2016   CO2 27 01/04/2016   Lab Results  Component Value Date   ALT 26 01/02/2016   AST 55* 01/02/2016   ALKPHOS 42 01/02/2016   BILITOT 0.4 01/02/2016   No results found for: IRON, TIBC, FERRITIN   Imaging Studies: Ct Abdomen Wo Contrast  05-18-2016  CLINICAL DATA:  Followup pulmonary nodule.  Adrenal adenomas. EXAM: CT CHEST, ABDOMEN AND PELVIS WITHOUT CONTRAST TECHNIQUE: Multidetector CT imaging of  the chest, abdomen and pelvis was performed following the standard protocol without IV contrast. COMPARISON:  CT 01/20/2016 FINDINGS: CT CHEST FINDINGS Mediastinum/Nodes: No axillary or supraclavicular adenopathy. No mediastinal or hilar adenopathy. No pericardial fluid. Lungs/Pleura: 3 mm subpleural nodule in the LEFT upper lobe (image 17, series 4).Resolution of the LEFT lower lobe pulmonary nodule described on comparison CT. Musculoskeletal:  No aggressive osseous CT ABDOMEN AND PELVIS FINDINGS Hepatobiliary:  No focal hepatic lesion.  Postcholecystectomy. Pancreas: Pancreas is normal. No ductal dilatation. No pancreatic inflammation. Spleen: Normal spleen Adrenals/urinary tract: Bilateral nodular adrenal adenomas. Low attenuation cystic lesions in LEFT and RIGHT kidney which have simple fluid attenuation. No obstructive uropathy. Bladder normal Stomach/Bowel: Stomach, small bowel, appendix, and cecum are normal. Diverticula of the descending colon and sigmoid colon. Vascular/Lymphatic: Abdominal aorta is normal caliber with atherosclerotic calcification. There is no retroperitoneal or periportal lymphadenopathy. No pelvic lymphadenopathy. Reproductive: Prostate normal. Other: No free fluid. Musculoskeletal: No aggressive osseous lesion. IMPRESSION: Chest Impression: 1. Resolution of LEFT lower lobe pulmonary nodule. 2. Small subpleural LEFT upper lobe nodule. No follow-up needed if patient is low-risk. Non-contrast chest CT can be considered in 12 months if patient is high-risk. This recommendation follows the consensus statement: Guidelines for Management of Incidental Pulmonary Nodules Detected on CT Images:From the Fleischner Society 2017; published online before print (10.1148/radiol.SG:[id number]). Abdomen / Pelvis Impression: 1. Bilateral benign adrenal adenomas. 2. Bilateral benign renal cysts. 3. Sigmoid diverticulosis. Electronically Signed   By: Suzy Bouchard M.D.   On: 05-18-16 10:06   Ct Chest Wo  Contrast  May 18, 2016  CLINICAL DATA:  Followup pulmonary nodule.  Adrenal adenomas. EXAM: CT CHEST, ABDOMEN AND PELVIS WITHOUT CONTRAST TECHNIQUE: Multidetector CT imaging of the chest, abdomen and pelvis was performed following the standard protocol without IV contrast. COMPARISON:  CT 01/20/2016 FINDINGS: CT CHEST FINDINGS Mediastinum/Nodes: No axillary or supraclavicular adenopathy. No mediastinal or hilar adenopathy. No pericardial fluid. Lungs/Pleura: 3 mm subpleural nodule in the LEFT upper lobe (image 17, series 4).Resolution of the LEFT lower lobe pulmonary nodule described on comparison CT. Musculoskeletal:  No aggressive osseous CT ABDOMEN AND PELVIS FINDINGS Hepatobiliary:  No focal hepatic lesion.  Postcholecystectomy. Pancreas: Pancreas is normal. No ductal dilatation. No pancreatic inflammation. Spleen: Normal spleen Adrenals/urinary tract: Bilateral nodular adrenal adenomas. Low attenuation cystic lesions in LEFT and RIGHT kidney which have simple fluid attenuation. No obstructive uropathy. Bladder normal Stomach/Bowel: Stomach, small bowel, appendix, and cecum are normal. Diverticula of the  descending colon and sigmoid colon. Vascular/Lymphatic: Abdominal aorta is normal caliber with atherosclerotic calcification. There is no retroperitoneal or periportal lymphadenopathy. No pelvic lymphadenopathy. Reproductive: Prostate normal. Other: No free fluid. Musculoskeletal: No aggressive osseous lesion. IMPRESSION: Chest Impression: 1. Resolution of LEFT lower lobe pulmonary nodule. 2. Small subpleural LEFT upper lobe nodule. No follow-up needed if patient is low-risk. Non-contrast chest CT can be considered in 12 months if patient is high-risk. This recommendation follows the consensus statement: Guidelines for Management of Incidental Pulmonary Nodules Detected on CT Images:From the Fleischner Society 2017; published online before print (10.1148/[id number]). Abdomen / Pelvis Impression: 1.  Bilateral benign adrenal adenomas. 2. Bilateral benign renal cysts. 3. Sigmoid diverticulosis. Electronically Signed   By: Suzy Bouchard M.D.   On: 05/10/2016 10:06

## 2016-06-01 NOTE — Patient Instructions (Signed)
1. Colonoscopy as scheduled. See separate instructions.  2. Would consider chest ct to follow up new pulmonary nodule in one year. We will call and offer in one year and proceed if you are interested. 3. We will follow up on your blood work.

## 2016-06-12 ENCOUNTER — Telehealth: Payer: Self-pay

## 2016-06-12 NOTE — Telephone Encounter (Signed)
Pt called back and he was able to find a ride

## 2016-06-12 NOTE — Telephone Encounter (Signed)
Tried to call patient since he leave VM about his TCS.

## 2016-06-14 ENCOUNTER — Encounter (HOSPITAL_COMMUNITY)
Admission: RE | Disposition: A | Discharge status: Home / Self Care | Payer: Self-pay | Source: Ambulatory Visit | Attending: Internal Medicine

## 2016-06-14 ENCOUNTER — Encounter (HOSPITAL_COMMUNITY): Payer: Self-pay | Admitting: *Deleted

## 2016-06-14 ENCOUNTER — Ambulatory Visit (HOSPITAL_COMMUNITY)
Admission: RE | Admit: 2016-06-14 | Discharge: 2016-06-14 | Disposition: A | Discharge status: Home / Self Care | Payer: Medicare Other | Source: Ambulatory Visit | Attending: Internal Medicine | Admitting: Internal Medicine

## 2016-06-14 DIAGNOSIS — Z86010 Personal history of colonic polyps: Secondary | ICD-10-CM

## 2016-06-14 DIAGNOSIS — I10 Essential (primary) hypertension: Secondary | ICD-10-CM | POA: Insufficient documentation

## 2016-06-14 DIAGNOSIS — J449 Chronic obstructive pulmonary disease, unspecified: Secondary | ICD-10-CM | POA: Diagnosis not present

## 2016-06-14 DIAGNOSIS — E785 Hyperlipidemia, unspecified: Secondary | ICD-10-CM | POA: Insufficient documentation

## 2016-06-14 DIAGNOSIS — Z96652 Presence of left artificial knee joint: Secondary | ICD-10-CM | POA: Diagnosis not present

## 2016-06-14 DIAGNOSIS — D120 Benign neoplasm of cecum: Secondary | ICD-10-CM | POA: Insufficient documentation

## 2016-06-14 DIAGNOSIS — D122 Benign neoplasm of ascending colon: Secondary | ICD-10-CM | POA: Insufficient documentation

## 2016-06-14 DIAGNOSIS — K5730 Diverticulosis of large intestine without perforation or abscess without bleeding: Secondary | ICD-10-CM | POA: Diagnosis not present

## 2016-06-14 DIAGNOSIS — D127 Benign neoplasm of rectosigmoid junction: Secondary | ICD-10-CM | POA: Diagnosis not present

## 2016-06-14 DIAGNOSIS — K635 Polyp of colon: Secondary | ICD-10-CM | POA: Diagnosis not present

## 2016-06-14 DIAGNOSIS — E119 Type 2 diabetes mellitus without complications: Secondary | ICD-10-CM | POA: Insufficient documentation

## 2016-06-14 DIAGNOSIS — Z7984 Long term (current) use of oral hypoglycemic drugs: Secondary | ICD-10-CM | POA: Diagnosis not present

## 2016-06-14 DIAGNOSIS — Z79899 Other long term (current) drug therapy: Secondary | ICD-10-CM | POA: Insufficient documentation

## 2016-06-14 DIAGNOSIS — Z1211 Encounter for screening for malignant neoplasm of colon: Secondary | ICD-10-CM | POA: Diagnosis present

## 2016-06-14 HISTORY — PX: COLONOSCOPY: SHX5424

## 2016-06-14 LAB — GLUCOSE, CAPILLARY: GLUCOSE-CAPILLARY: 100 mg/dL — AB (ref 65–99)

## 2016-06-14 SURGERY — COLONOSCOPY
Anesthesia: Moderate Sedation

## 2016-06-14 MED ORDER — ONDANSETRON HCL 4 MG/2ML IJ SOLN
INTRAMUSCULAR | Status: AC
  Filled 2016-06-14: qty 2

## 2016-06-14 MED ORDER — MEPERIDINE HCL 100 MG/ML IJ SOLN
INTRAMUSCULAR | Status: DC | PRN
  Administered 2016-06-14: 50 mg via INTRAVENOUS
  Administered 2016-06-14: 25 mg via INTRAVENOUS

## 2016-06-14 MED ORDER — MIDAZOLAM HCL 5 MG/5ML IJ SOLN
INTRAMUSCULAR | Status: DC | PRN
  Administered 2016-06-14: 1 mg via INTRAVENOUS
  Administered 2016-06-14: 2 mg via INTRAVENOUS
  Administered 2016-06-14: 1 mg via INTRAVENOUS

## 2016-06-14 MED ORDER — STERILE WATER FOR IRRIGATION IR SOLN
Status: DC | PRN
  Administered 2016-06-14: 11:00:00

## 2016-06-14 MED ORDER — ONDANSETRON HCL 4 MG/2ML IJ SOLN
INTRAMUSCULAR | Status: DC | PRN
  Administered 2016-06-14: 4 mg via INTRAVENOUS

## 2016-06-14 MED ORDER — MIDAZOLAM HCL 5 MG/5ML IJ SOLN
INTRAMUSCULAR | Status: AC
  Filled 2016-06-14: qty 10

## 2016-06-14 MED ORDER — SODIUM CHLORIDE 0.9 % IV SOLN
INTRAVENOUS | Status: DC
  Administered 2016-06-14: 11:00:00 via INTRAVENOUS

## 2016-06-14 MED ORDER — MEPERIDINE HCL 100 MG/ML IJ SOLN
INTRAMUSCULAR | Status: DC
Start: 2016-06-14 — End: 2016-06-14
  Filled 2016-06-14: qty 2

## 2016-06-14 NOTE — Interval H&P Note (Signed)
History and Physical Interval Note:  06/14/2016 11:08 AM  Todd George  has presented today for surgery, with the diagnosis of COLON POLYPS  The various methods of treatment have been discussed with the patient and family. After consideration of risks, benefits and other options for treatment, the patient has consented to  Procedure(s) with comments: COLONOSCOPY (N/A) - 1200 as a surgical intervention .  The patient's history has been reviewed, patient examined, no change in status, stable for surgery.  I have reviewed the patient's chart and labs.  Questions were answered to the patient's satisfaction.     Katharin Schneider  No change. Surveillance colonoscopy per plan.The risks, benefits, limitations, alternatives and imponderables have been reviewed with the patient. Questions have been answered. All parties are agreeable.

## 2016-06-14 NOTE — H&P (View-Only) (Signed)
Primary Care Physician:  Jani Gravel, MD  Primary Gastroenterologist:  Garfield Cornea, MD   Chief Complaint  Patient presents with  . Follow-up    HPI:  Todd George is a 75 y.o. male here For follow-up to schedule short interval colonoscopy he had a 1.5 cm carpet polyp lying in a valley between 2 folds of the ascending segment. Felt to be removed completely. Area was tattooed. Pathology revealed tubular adenoma. 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 Noted to have bloody effluent coming down from small bowel in the terminal ileum was intubated. Mucosa however appeared normal.  Patient has a history of melena, GI bleed with multiple gastric and duodenal erosions likely NSAID related, colonoscopy findings as outlined. Small bowel capsule endoscopy in January was incomplete. Capsule never reach the cecum. Scattered erosions, AVMs throughout the small bowel, several appear to be using oozing. CTE did not show any evidence of small bowel tumor or explain bleeding.  Since he was last here he did follow-up with the pulmonologist. He had a chest and abdominal CT, previously seen left lower lobe pulmonary nodule was no longer seen. New nodule present however. Adrenal glands. Be adenomas with no further workup needed. Noncontrast chest CT recommended in 12 months of the left upper lobe pulmonary nodule since he is a smoker.  BM regular. No melena, brbpr. Iron QOD. No heartburn/indigestion. No dysphagia. No n/v.Patient has a history of colonic polyps on every colonoscopy he has ever had (total of about 5). Denies abdominal pain.  Lab work planned at [facility]. in a few weeks. Includes CBC.  Current Outpatient Prescriptions  Medication Sig Dispense Refill  . cyclobenzaprine (FLEXERIL) 10 MG tablet Take 10 mg by mouth 3 (three) times daily as needed for muscle spasms.    Marland Kitchen docusate sodium (COLACE) 100 MG capsule Take 100 mg by mouth 2 (two) times daily.    .  ferrous sulfate 325 (65 FE) MG tablet Take 1 tablet (325 mg total) by mouth 2 (two) times daily with a meal. 60 tablet 3  . metFORMIN (GLUCOPHAGE) 500 MG tablet Take 500 mg by mouth daily.     . metoprolol succinate (TOPROL-XL) 25 MG 24 hr tablet Take 25 mg by mouth daily.    . Multiple Vitamins-Minerals (PRESERVISION AREDS 2) CAPS Take 2 capsules by mouth daily. Reported on 06/01/2016    . oxybutynin (DITROPAN-XL) 5 MG 24 hr tablet Take 5 mg by mouth at bedtime.    . pantoprazole (PROTONIX) 40 MG tablet Take 1 tablet (40 mg total) by mouth 2 (two) times daily. 60 tablet 2  . simvastatin (ZOCOR) 20 MG tablet Take 20 mg by mouth daily.    . tamsulosin (FLOMAX) 0.4 MG CAPS capsule Take 0.4 mg by mouth daily.      No current facility-administered medications for this visit.    Allergies as of 06/01/2016  . (No Known Allergies)    Past Medical History  Diagnosis Date  . Hyperlipidemia   . Cellulitis   . BPH (benign prostatic hyperplasia)   . Chronic back pain   . Carotid artery disease ([place])   . COPD (chronic obstructive pulmonary disease) ([place])     2ppd  . OSA on CPAP     not used machine in over 10 yrs  . Diabetes mellitus without complication ([place])   . Hypertension   . Duodenal erosion 01/02/2016  . Gastric erosion with bleeding 01/02/2016  . Colon polyps 01/04/2016  .  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 Replacement total knee Left   . Cholecystectomy    . Joint replacement    . Throat surgery  1965  . Shoulder arthroscopy with subacromial decompression Right 05/28/2015    Procedure: SHOULDER ARTHROSCOPY WITH SUBACROMIAL DECOMPRESSION;  Surgeon: Melrose Nakayama, MD;  Location: [facility];  Service: Orthopedics;  Laterality: Right;  . Shoulder arthroscopy with distal clavicle resection Right 05/28/2015    Procedure: SHOULDER ARTHROSCOPY WITH DISTAL CLAVICLE RESECTION;  Surgeon: Melrose Nakayama, MD;  Location: [facility];  Service: Orthopedics;  Laterality: Right;  . [place] capsule study N/A 01/03/2016    Incomplete study, capsule never reach cecum. Scattered erosions and AVMs throughout the small bowel.  . Colonoscopy N/A 01/03/2016    RMR: Active bleeding seen arising from the small bowel. Multiple cecal/ascending colon AVMs ablated. 1.5 cm carpet polyp in the ascending segment status post piecemeal snare polypectomy and tattooing. Innocent appearing colonic diverticulosis.  . Esophagogastroduodenoscopy N/A 01/01/2016    RMR: Multiple gastric and duodenal erosions likely NSAID related.    No family history on file.  Social History   Social History  . Marital Status: Divorced    Spouse Name: N/A  . Number of Children: N/A  . Years of Education: N/A   Occupational History  . Not on file.   Social History Main Topics  . Smoking status: Current Every Day Smoker -- 2.00 packs/day for 64 years  . Smokeless tobacco: Not on file  . Alcohol Use: 0.0 oz/week    0 Standard drinks or equivalent per week     Comment: social  . Drug Use: No  . Sexual Activity: No   Other Topics Concern  . Not on file   Social History Narrative   Divorced   Lives alone    [place] here from [place] in Jan 2016   Retired - Agricultural consultant      ROS:  General: Negative for anorexia, weight loss, fever, chills, fatigue, weakness. Eyes: Negative for vision changes.  ENT: Negative for hoarseness, difficulty swallowing , nasal congestion. CV: Negative for chest pain, angina, palpitations, dyspnea on exertion, peripheral edema.  Respiratory: Negative for dyspnea at rest, dyspnea on exertion, cough, sputum, wheezing.  GI: See history of present illness. GU:  Negative for dysuria, hematuria, urinary incontinence, urinary frequency, nocturnal urination.  MS: Negative for joint pain, low back pain.  Derm: Negative for rash or itching.  Neuro: Negative for weakness, abnormal  sensation, seizure, frequent headaches, memory loss, confusion.  Psych: Negative for anxiety, depression, suicidal ideation, hallucinations.  Endo: Negative for unusual weight change.  Heme: Negative for bruising or bleeding. Allergy: Negative for rash or hives.    Physical Examination:  BP 182/79 mmHg  Pulse 69  Temp(Src) 96.9 F (36.1 C)  Ht 5\' 10"  (1.778 m)  Wt 213 lb 9.6 oz (96.888 kg)  BMI 30.65 kg/m2   General: Well-nourished, well-developed in no acute distress.  Head: Normocephalic, atraumatic.   Eyes: Conjunctiva pink, no icterus. Mouth: Oropharyngeal mucosa moist and pink , no lesions erythema or exudate. Neck: Supple without thyromegaly, masses, or lymphadenopathy.  Lungs: Clear to auscultation bilaterally.  Heart: Regular rate and rhythm, no murmurs rubs or gallops.  Abdomen: Bowel sounds are normal, nontender, nondistended, no hepatosplenomegaly or masses, no abdominal bruits or    hernia , no rebound or guarding.   Rectal: Not performed Extremities: No  lower extremity edema. No clubbing or deformities.  Neuro: Alert and oriented x 4 , grossly normal neurologically.  Skin: Warm and dry, no rash or jaundice.   Psych: Alert and cooperative, normal mood and affect.  Labs: Lab Results  Component Value Date   WBC 12.2* 01/05/2016   HGB 9.0* 01/05/2016   HCT 27.0* 01/05/2016   MCV 88.8 01/05/2016   PLT 260 01/05/2016   Lab Results  Component Value Date   CREATININE 0.96 01/04/2016   BUN 13 01/04/2016   NA 139 01/04/2016   K 4.2 01/04/2016   CL 106 01/04/2016   CO2 27 01/04/2016   Lab Results  Component Value Date   ALT 26 01/02/2016   AST 55* 01/02/2016   ALKPHOS 42 01/02/2016   BILITOT 0.4 01/02/2016   No results found for: IRON, TIBC, FERRITIN   Imaging Studies: Ct Abdomen Wo Contrast  01-Jun-2016  CLINICAL DATA:  Followup pulmonary nodule.  Adrenal adenomas. EXAM: CT CHEST, ABDOMEN AND PELVIS WITHOUT CONTRAST TECHNIQUE: Multidetector CT imaging of  the chest, abdomen and pelvis was performed following the standard protocol without IV contrast. COMPARISON:  CT 01/20/2016 FINDINGS: CT CHEST FINDINGS Mediastinum/Nodes: No axillary or supraclavicular adenopathy. No mediastinal or hilar adenopathy. No pericardial fluid. Lungs/Pleura: 3 mm subpleural nodule in the LEFT upper lobe (image 17, series 4).Resolution of the LEFT lower lobe pulmonary nodule described on comparison CT. Musculoskeletal:  No aggressive osseous CT ABDOMEN AND PELVIS FINDINGS Hepatobiliary:  No focal hepatic lesion.  Postcholecystectomy. Pancreas: Pancreas is normal. No ductal dilatation. No pancreatic inflammation. Spleen: Normal spleen Adrenals/urinary tract: Bilateral nodular adrenal adenomas. Low attenuation cystic lesions in LEFT and RIGHT kidney which have simple fluid attenuation. No obstructive uropathy. Bladder normal Stomach/Bowel: Stomach, small bowel, appendix, and cecum are normal. Diverticula of the descending colon and sigmoid colon. Vascular/Lymphatic: Abdominal aorta is normal caliber with atherosclerotic calcification. There is no retroperitoneal or periportal lymphadenopathy. No pelvic lymphadenopathy. Reproductive: Prostate normal. Other: No free fluid. Musculoskeletal: No aggressive osseous lesion. IMPRESSION: Chest Impression: 1. Resolution of LEFT lower lobe pulmonary nodule. 2. Small subpleural LEFT upper lobe nodule. No follow-up needed if patient is low-risk. Non-contrast chest CT can be considered in 12 months if patient is high-risk. This recommendation follows the consensus statement: Guidelines for Management of Incidental Pulmonary Nodules Detected on CT Images:From the Fleischner Society 2017; published online before print (10.1148/radiol.SG:5268862). Abdomen / Pelvis Impression: 1. Bilateral benign adrenal adenomas. 2. Bilateral benign renal cysts. 3. Sigmoid diverticulosis. Electronically Signed   By: Suzy Bouchard M.D.   On: 06-01-2016 10:06   Ct Chest Wo  Contrast  June 01, 2016  CLINICAL DATA:  Followup pulmonary nodule.  Adrenal adenomas. EXAM: CT CHEST, ABDOMEN AND PELVIS WITHOUT CONTRAST TECHNIQUE: Multidetector CT imaging of the chest, abdomen and pelvis was performed following the standard protocol without IV contrast. COMPARISON:  CT 01/20/2016 FINDINGS: CT CHEST FINDINGS Mediastinum/Nodes: No axillary or supraclavicular adenopathy. No mediastinal or hilar adenopathy. No pericardial fluid. Lungs/Pleura: 3 mm subpleural nodule in the LEFT upper lobe (image 17, series 4).Resolution of the LEFT lower lobe pulmonary nodule described on comparison CT. Musculoskeletal:  No aggressive osseous CT ABDOMEN AND PELVIS FINDINGS Hepatobiliary:  No focal hepatic lesion.  Postcholecystectomy. Pancreas: Pancreas is normal. No ductal dilatation. No pancreatic inflammation. Spleen: Normal spleen Adrenals/urinary tract: Bilateral nodular adrenal adenomas. Low attenuation cystic lesions in LEFT and RIGHT kidney which have simple fluid attenuation. No obstructive uropathy. Bladder normal Stomach/Bowel: Stomach, small bowel, appendix, and cecum are normal. Diverticula of the  descending colon and sigmoid colon. Vascular/Lymphatic: Abdominal aorta is normal caliber with atherosclerotic calcification. There is no retroperitoneal or periportal lymphadenopathy. No pelvic lymphadenopathy. Reproductive: Prostate normal. Other: No free fluid. Musculoskeletal: No aggressive osseous lesion. IMPRESSION: Chest Impression: 1. Resolution of LEFT lower lobe pulmonary nodule. 2. Small subpleural LEFT upper lobe nodule. No follow-up needed if patient is low-risk. Non-contrast chest CT can be considered in 12 months if patient is high-risk. This recommendation follows the consensus statement: Guidelines for Management of Incidental Pulmonary Nodules Detected on CT Images:From the Fleischner Society 2017; published online before print (10.1148/radiol.SG:5268862). Abdomen / Pelvis Impression: 1.  Bilateral benign adrenal adenomas. 2. Bilateral benign renal cysts. 3. Sigmoid diverticulosis. Electronically Signed   By: Suzy Bouchard M.D.   On: 05/10/2016 10:06

## 2016-06-14 NOTE — Discharge Instructions (Signed)
°Colonoscopy °Discharge Instructions ° °Read the instructions outlined below and refer to this sheet in the next few weeks. These discharge instructions provide you with general information on caring for yourself after you leave the hospital. Your doctor may also give you specific instructions. While your treatment has been planned according to the most current medical practices available, unavoidable complications occasionally occur. If you have any problems or questions after discharge, call Dr. Rourk at [phone]. °ACTIVITY °· You may resume your regular activity, but move at a slower pace for the next 24 hours.  °· Take frequent rest periods for the next 24 hours.  °· Walking will help get rid of the air and reduce the bloated feeling in your belly (abdomen).  °· No driving for 24 hours (because of the medicine (anesthesia) used during the test).   °· Do not sign any important legal documents or operate any machinery for 24 hours (because of the anesthesia used during the test).  °NUTRITION °· Drink plenty of fluids.  °· You may resume your normal diet as instructed by your doctor.  °· Begin with a light meal and progress to your normal diet. Heavy or fried foods are harder to digest and may make you feel sick to your stomach (nauseated).  °· Avoid alcoholic beverages for 24 hours or as instructed.  °MEDICATIONS °· You may resume your normal medications unless your doctor tells you otherwise.  °WHAT YOU CAN EXPECT TODAY °· Some feelings of bloating in the abdomen.  °· Passage of more gas than usual.  °· Spotting of blood in your stool or on the toilet paper.  °IF YOU HAD POLYPS REMOVED DURING THE COLONOSCOPY: °· No aspirin products for 7 days or as instructed.  °· No alcohol for 7 days or as instructed.  °· Eat a soft diet for the next 24 hours.  °FINDING OUT THE RESULTS OF YOUR TEST °Not all test results are available during your visit. If your test results are not back during the visit, make an appointment  with your caregiver to find out the results. Do not assume everything is normal if you have not heard from your caregiver or the medical facility. It is important for you to follow up on all of your test results.  °SEEK IMMEDIATE MEDICAL ATTENTION IF: °· You have more than a spotting of blood in your stool.  °· Your belly is swollen (abdominal distention).  °· You are nauseated or vomiting.  °· You have a temperature over 101.  °· You have abdominal pain or discomfort that is severe or gets worse throughout the day.  ° ° ° °Colon polyp and diverticulosis information provided ° °Further recommendations to follow pending review of pathology report ° ° ° ° ° °                                                                                                                     Colon Polyps °Polyps are lumps of extra tissue growing inside the   body. Polyps can grow in the large intestine (colon). Most colon polyps are noncancerous (benign). However, some colon polyps can become cancerous over time. Polyps that are larger than a pea may be harmful. To be safe, caregivers remove and test all polyps. °CAUSES  °Polyps form when mutations in the genes cause your cells to grow and divide even though no more tissue is needed. °RISK FACTORS °There are a number of risk factors that can increase your chances of getting colon polyps. They include: °· Being older than 50 years. °· Family history of colon polyps or colon cancer. °· Long-term colon diseases, such as colitis or Crohn disease. °· Being overweight. °· Smoking. °· Being inactive. °· Drinking too much alcohol. °SYMPTOMS  °Most small polyps do not cause symptoms. If symptoms are present, they may include: °· Blood in the stool. The stool may look dark red or black. °· Constipation or diarrhea that lasts longer than 1 week. °DIAGNOSIS °People often do not know they have polyps until their caregiver finds them during a regular checkup. Your caregiver can use 4 tests to check for  polyps: °· Digital rectal exam. The caregiver wears gloves and feels inside the rectum. This test would find polyps only in the rectum. °· Barium enema. The caregiver puts a liquid called barium into your rectum before taking X-rays of your colon. Barium makes your colon look white. Polyps are dark, so they are easy to see in the X-ray pictures. °· Sigmoidoscopy. A thin, flexible tube (sigmoidoscope) is placed into your rectum. The sigmoidoscope has a light and tiny camera in it. The caregiver uses the sigmoidoscope to look at the last third of your colon. °· Colonoscopy. This test is like sigmoidoscopy, but the caregiver looks at the entire colon. This is the most common method for finding and removing polyps. °TREATMENT  °Any polyps will be removed during a sigmoidoscopy or colonoscopy. The polyps are then tested for cancer. °PREVENTION  °To help lower your risk of getting more colon polyps: °· Eat plenty of fruits and vegetables. Avoid eating fatty foods. °· Do not smoke. °· Avoid drinking alcohol. °· Exercise every day. °· Lose weight if recommended by your caregiver. °· Eat plenty of calcium and folate. Foods that are rich in calcium include milk, cheese, and broccoli. Foods that are rich in folate include chickpeas, kidney beans, and spinach. °HOME CARE INSTRUCTIONS °Keep all follow-up appointments as directed by your caregiver. You may need periodic exams to check for polyps. °SEEK MEDICAL CARE IF: °You notice bleeding during a bowel movement. °  °This information is not intended to replace advice given to you by your health care provider. Make sure you discuss any questions you have with your health care provider. °  °Document Released: 09/06/2004 Document Revised: 01/01/2015 Document Reviewed: 02/20/2012 °Elsevier Interactive Patient Education ©2016 [employer] ° ° ° ° ° ° ° °Diverticulosis °Diverticulosis is the condition that develops when small pouches (diverticula) form in the wall of your colon. Your  colon, or large intestine, is where water is absorbed and stool is formed. The pouches form when the inside layer of your colon pushes through weak spots in the outer layers of your colon. °CAUSES  °No one knows exactly what causes diverticulosis. °RISK FACTORS °· Being older than 50. Your risk for this condition increases with age. Diverticulosis is rare in people younger than 40 years. By age 80, almost everyone has it. °· Eating a low-fiber diet. °· Being frequently constipated. °· Being overweight. °·   Not getting enough exercise. °· Smoking. °· Taking over-the-counter pain medicines, like aspirin and ibuprofen. °SYMPTOMS  °Most people with diverticulosis do not have symptoms. °DIAGNOSIS  °Because diverticulosis often has no symptoms, health care providers often discover the condition during an exam for other colon problems. In many cases, a health care provider will diagnose diverticulosis while using a flexible scope to examine the colon (colonoscopy). °TREATMENT  °If you have never developed an infection related to diverticulosis, you may not need treatment. If you have had an infection before, treatment may include: °· Eating more fruits, vegetables, and grains. °· Taking a fiber supplement. °· Taking a live bacteria supplement (probiotic). °· Taking medicine to relax your colon. °HOME CARE INSTRUCTIONS  °· Drink at least 6-8 glasses of water each day to prevent constipation. °· Try not to strain when you have a bowel movement. °· Keep all follow-up appointments. °If you have had an infection before:  °· Increase the fiber in your diet as directed by your health care provider or dietitian. °· Take a dietary fiber supplement if your health care provider approves. °· Only take medicines as directed by your health care provider. °SEEK MEDICAL CARE IF:  °· You have abdominal pain. °· You have bloating. °· You have cramps. °· You have not gone to the bathroom in 3 days. °SEEK IMMEDIATE MEDICAL CARE IF:  °· Your  pain gets worse. °· Your bloating becomes very bad. °· You have a fever or chills, and your symptoms suddenly get worse. °· You begin vomiting. °· You have bowel movements that are bloody or black. °MAKE SURE YOU: °· Understand these instructions. °· Will watch your condition. °· Will get help right away if you are not doing well or get worse. °  °This information is not intended to replace advice given to you by your health care provider. Make sure you discuss any questions you have with your health care provider. °  °Document Released: 09/07/2004 Document Revised: 12/16/2013 Document Reviewed: 11/05/2013 °Elsevier Interactive Patient Education ©2016 [employer] ° ° °

## 2016-06-14 NOTE — Op Note (Addendum)
[facility] Patient Name: Daimian Booth Procedure Date: 06/14/2016 11:08 AM MRN: [id number] Date of Birth: 25-May-1941 Attending MD: Norvel Richards , MD CSN: [id number] Age: 75 Admit Type: Outpatient Procedure:                Colonoscopy with snare polypectomy and polyp                            ablation Indications:              High risk colon cancer surveillance: Personal                            history of colonic polyps Providers:                Norvel Richards, MD, Hinton Rao, RN, Georgeann Oppenheim, Technician Referring MD:              Medicines:                Midazolam 4 mg IV, Meperidine 75 mg IV, Ondansetron                            4 mg IV Complications:            No immediate complications. Estimated Blood Loss:     Estimated blood loss was minimal. Procedure:                Pre-Anesthesia Assessment:                           - Prior to the procedure, a History and Physical                            was performed, and patient medications and                            allergies were reviewed. The patient's tolerance of                            previous anesthesia was also reviewed. The risks                            and benefits of the procedure and the sedation                            options and risks were discussed with the patient.                            All questions were answered, and informed consent                            was obtained. Prior Anticoagulants: The patient has  taken no previous anticoagulant or antiplatelet                            agents. ASA Grade Assessment: II - A patient with                            mild systemic disease. After reviewing the risks                            and benefits, the patient was deemed in                            satisfactory condition to undergo the procedure.                           - Prior to the procedure, a History  and Physical                            was performed, and patient medications and                            allergies were reviewed. The patient's tolerance of                            previous anesthesia was also reviewed. The risks                            and benefits of the procedure and the sedation                            options and risks were discussed with the patient.                            All questions were answered, and informed consent                            was obtained. Prior Anticoagulants: The patient has                            taken no previous anticoagulant or antiplatelet                            agents. After reviewing the risks and benefits, the                            patient was deemed in satisfactory condition to                            undergo the procedure.                           After obtaining informed consent, the colonoscope  was passed under direct vision. Throughout the                            procedure, the patient's blood pressure, pulse, and                            oxygen saturations were monitored continuously. The                            [id number]-[id number]) scope was introduced through                            the anus and advanced to the the cecum, identified                            by appendiceal orifice and ileocecal valve. The                            colonoscopy was performed without difficulty. The                            patient tolerated the procedure well. The quality                            of the bowel preparation was adequate. The entire                            colon was well visualized. The ileocecal valve,                            appendiceal orifice, and rectum were photographed. Scope In: 11:21:13 AM Scope Out: 11:40:15 AM Scope Withdrawal Time: 0 hours 11 minutes 53 seconds  Total Procedure Duration: 0 hours 19 minutes 2 seconds  Findings:      The  perianal and digital rectal examinations were normal.      Medium-mouthed diverticula were found in the anus, sigmoid colon and       descending colon.      A 5 mm polyp was found in the cecum. The polyp was semi-pedunculated.       The polyp was removed with a hot snare. Resection and retrieval were       complete. Estimated blood loss: none.      A 6 mm polyp was found in the ascending colon. The polyp was       semi-pedunculated. Nonbleeding cecal and ascending AVMs observed.      A 6 mm polyp was found in the descending colon. The polyp was       semi-pedunculated. ite of prior tattooing and ascending segment was       closely inspected. No residual polyp tissue in this area seen.      A 5 mm polyp was found in the recto-sigmoid colon. The polyp was       semi-pedunculated. The polyp was removed with a hot snare. Resection and       retrieval were complete. Estimated blood loss: none. Estimated blood       loss was minimal. Impression:               -  Diverticulosis at the anus, in the sigmoid colon                            and in the descending colon.                           - One 5 mm polyp in the cecum, removed with a hot                            snare. Resected and retrieved.                           - One 6 mm polyp in the ascending colon.                           - One 6 mm polyp in the descending colon.                           - One 5 mm polyp at the recto-sigmoid colon,                            removed with a hot snare. Resected and retrieved. 1                            rectosigmoid polyp ablated with a hot snare loop. Moderate Sedation:      Moderate (conscious) sedation was administered by the endoscopy nurse       and supervised by the endoscopist. The following parameters were       monitored: oxygen saturation, heart rate, blood pressure, respiratory       rate, EKG, adequacy of pulmonary ventilation, and response to care.       Total physician intraservice  time was 27 minutes. Recommendation:           - Patient has a contact number available for                            emergencies. The signs and symptoms of potential                            delayed complications were discussed with the                            patient. Return to normal activities tomorrow.                            Written discharge instructions were provided to the                            patient.                           - Advance diet as tolerated.                           - Continue  present medications.                           - Await pathology results.                           - Repeat colonoscopy date to be determined after                            pending pathology results are reviewed for                            surveillance based on pathology results.                           - Return to GI office after studies are complete. Procedure Code(s):        --- Professional ---                           [id number], Colonoscopy, flexible; with removal of                            tumor(s), polyp(s), or other lesion(s) by snare                            technique                           99152, Moderate sedation services provided by the                            same physician or other qualified health care                            professional performing the diagnostic or                            therapeutic service that the sedation supports,                            requiring the presence of an independent trained                            observer to assist in the monitoring of the                            patient's level of consciousness and physiological                            status; initial 15 minutes of intraservice time,                            patient age 76 years or older                           [phone],  Moderate sedation services; each additional                            15 minutes intraservice time Diagnosis Code(s):         --- Professional ---                           D12.0, Benign neoplasm of cecum                           D12.2, Benign neoplasm of ascending colon                           D12.4, Benign neoplasm of descending colon                           D12.7, Benign neoplasm of rectosigmoid junction                           K57.30, Diverticulosis of large intestine without                            perforation or abscess without bleeding CPT copyright 2016 [employer]. All rights reserved. The codes documented in this report are preliminary and upon coder review may  be revised to meet current compliance requirements. Cristopher Estimable. Rourk, MD Norvel Richards, MD 06/14/2016 11:52:22 AM This report has been signed electronically. Number of Addenda: 0

## 2016-06-15 ENCOUNTER — Encounter: Payer: Self-pay | Admitting: Internal Medicine

## 2016-06-16 ENCOUNTER — Encounter (HOSPITAL_COMMUNITY): Payer: Self-pay | Admitting: Internal Medicine

## 2017-04-20 DIAGNOSIS — E785 Hyperlipidemia, unspecified: Secondary | ICD-10-CM | POA: Diagnosis not present

## 2017-04-20 DIAGNOSIS — H3530 Unspecified macular degeneration: Secondary | ICD-10-CM | POA: Diagnosis not present

## 2017-04-20 DIAGNOSIS — I10 Essential (primary) hypertension: Secondary | ICD-10-CM | POA: Diagnosis not present

## 2017-04-20 DIAGNOSIS — F17200 Nicotine dependence, unspecified, uncomplicated: Secondary | ICD-10-CM | POA: Diagnosis not present

## 2017-04-20 DIAGNOSIS — M19041 Primary osteoarthritis, right hand: Secondary | ICD-10-CM | POA: Diagnosis not present

## 2017-04-20 DIAGNOSIS — H547 Unspecified visual loss: Secondary | ICD-10-CM | POA: Diagnosis not present

## 2017-04-20 DIAGNOSIS — M19042 Primary osteoarthritis, left hand: Secondary | ICD-10-CM | POA: Diagnosis not present

## 2017-04-20 DIAGNOSIS — E1151 Type 2 diabetes mellitus with diabetic peripheral angiopathy without gangrene: Secondary | ICD-10-CM | POA: Diagnosis not present

## 2017-04-20 DIAGNOSIS — E669 Obesity, unspecified: Secondary | ICD-10-CM | POA: Diagnosis not present

## 2017-05-01 ENCOUNTER — Telehealth: Payer: Self-pay | Admitting: Internal Medicine

## 2017-05-01 NOTE — Telephone Encounter (Signed)
RECALL FOR CT CHEST ?

## 2017-05-01 NOTE — Telephone Encounter (Signed)
Letter mailed

## 2017-06-12 ENCOUNTER — Ambulatory Visit: Payer: Self-pay | Admitting: Family Medicine

## 2017-06-18 DIAGNOSIS — M542 Cervicalgia: Secondary | ICD-10-CM | POA: Diagnosis not present

## 2017-06-18 DIAGNOSIS — I10 Essential (primary) hypertension: Secondary | ICD-10-CM | POA: Diagnosis not present

## 2017-06-18 DIAGNOSIS — J309 Allergic rhinitis, unspecified: Secondary | ICD-10-CM | POA: Diagnosis not present

## 2017-06-18 DIAGNOSIS — M545 Low back pain: Secondary | ICD-10-CM | POA: Diagnosis not present

## 2017-06-18 DIAGNOSIS — F17210 Nicotine dependence, cigarettes, uncomplicated: Secondary | ICD-10-CM | POA: Diagnosis not present

## 2017-06-18 DIAGNOSIS — J449 Chronic obstructive pulmonary disease, unspecified: Secondary | ICD-10-CM | POA: Diagnosis not present

## 2017-06-18 DIAGNOSIS — R011 Cardiac murmur, unspecified: Secondary | ICD-10-CM | POA: Diagnosis not present

## 2017-06-18 DIAGNOSIS — E785 Hyperlipidemia, unspecified: Secondary | ICD-10-CM | POA: Diagnosis not present

## 2017-06-18 DIAGNOSIS — M25561 Pain in right knee: Secondary | ICD-10-CM | POA: Diagnosis not present

## 2017-07-02 DIAGNOSIS — Z08 Encounter for follow-up examination after completed treatment for malignant neoplasm: Secondary | ICD-10-CM | POA: Diagnosis not present

## 2017-07-02 DIAGNOSIS — Z1283 Encounter for screening for malignant neoplasm of skin: Secondary | ICD-10-CM | POA: Diagnosis not present

## 2017-07-02 DIAGNOSIS — Z85828 Personal history of other malignant neoplasm of skin: Secondary | ICD-10-CM | POA: Diagnosis not present

## 2017-07-02 DIAGNOSIS — L570 Actinic keratosis: Secondary | ICD-10-CM | POA: Diagnosis not present

## 2017-07-02 DIAGNOSIS — L298 Other pruritus: Secondary | ICD-10-CM | POA: Diagnosis not present

## 2017-07-02 DIAGNOSIS — X32XXXD Exposure to sunlight, subsequent encounter: Secondary | ICD-10-CM | POA: Diagnosis not present

## 2017-07-11 ENCOUNTER — Telehealth: Payer: Self-pay

## 2017-07-11 DIAGNOSIS — I10 Essential (primary) hypertension: Secondary | ICD-10-CM | POA: Diagnosis not present

## 2017-07-11 DIAGNOSIS — E785 Hyperlipidemia, unspecified: Secondary | ICD-10-CM | POA: Diagnosis not present

## 2017-07-11 DIAGNOSIS — Z79899 Other long term (current) drug therapy: Secondary | ICD-10-CM | POA: Diagnosis not present

## 2017-07-11 NOTE — Telephone Encounter (Signed)
Please make sure patient knows that Chest CT is advised to follow up on new pulmonary nodule seen last year. He would not necessarily have symptoms from this. It is up to him if he has the CT but I want to make sure he knows what it is for.

## 2017-07-11 NOTE — Telephone Encounter (Signed)
Pt came by the office because he received a letter in the mail about a CT scan. He said that he would just like to hold off on that at this time. He is not having any problems.

## 2017-07-12 DIAGNOSIS — E119 Type 2 diabetes mellitus without complications: Secondary | ICD-10-CM | POA: Diagnosis not present

## 2017-07-12 DIAGNOSIS — N401 Enlarged prostate with lower urinary tract symptoms: Secondary | ICD-10-CM | POA: Diagnosis not present

## 2017-07-12 DIAGNOSIS — Z79899 Other long term (current) drug therapy: Secondary | ICD-10-CM | POA: Diagnosis not present

## 2017-07-12 DIAGNOSIS — E785 Hyperlipidemia, unspecified: Secondary | ICD-10-CM | POA: Diagnosis not present

## 2017-07-12 DIAGNOSIS — R52 Pain, unspecified: Secondary | ICD-10-CM | POA: Diagnosis not present

## 2017-07-12 DIAGNOSIS — R011 Cardiac murmur, unspecified: Secondary | ICD-10-CM | POA: Diagnosis not present

## 2017-07-12 DIAGNOSIS — I10 Essential (primary) hypertension: Secondary | ICD-10-CM | POA: Diagnosis not present

## 2017-07-12 DIAGNOSIS — R202 Paresthesia of skin: Secondary | ICD-10-CM | POA: Diagnosis not present

## 2017-07-12 NOTE — Telephone Encounter (Signed)
Pt is aware and still would like to hold off for now

## 2017-08-28 DIAGNOSIS — Z7984 Long term (current) use of oral hypoglycemic drugs: Secondary | ICD-10-CM | POA: Diagnosis not present

## 2017-08-28 DIAGNOSIS — H353131 Nonexudative age-related macular degeneration, bilateral, early dry stage: Secondary | ICD-10-CM | POA: Diagnosis not present

## 2017-08-28 DIAGNOSIS — T86849 Unspecified complication of corneal transplant: Secondary | ICD-10-CM | POA: Diagnosis not present

## 2017-08-28 DIAGNOSIS — E119 Type 2 diabetes mellitus without complications: Secondary | ICD-10-CM | POA: Diagnosis not present

## 2017-09-03 DIAGNOSIS — H43813 Vitreous degeneration, bilateral: Secondary | ICD-10-CM | POA: Diagnosis not present

## 2017-09-03 DIAGNOSIS — H35423 Microcystoid degeneration of retina, bilateral: Secondary | ICD-10-CM | POA: Diagnosis not present

## 2017-09-03 DIAGNOSIS — H353211 Exudative age-related macular degeneration, right eye, with active choroidal neovascularization: Secondary | ICD-10-CM | POA: Diagnosis not present

## 2017-09-03 DIAGNOSIS — H353124 Nonexudative age-related macular degeneration, left eye, advanced atrophic with subfoveal involvement: Secondary | ICD-10-CM | POA: Diagnosis not present

## 2017-09-04 DIAGNOSIS — H353211 Exudative age-related macular degeneration, right eye, with active choroidal neovascularization: Secondary | ICD-10-CM | POA: Diagnosis not present

## 2017-09-04 DIAGNOSIS — H43813 Vitreous degeneration, bilateral: Secondary | ICD-10-CM | POA: Diagnosis not present

## 2017-09-04 DIAGNOSIS — H35423 Microcystoid degeneration of retina, bilateral: Secondary | ICD-10-CM | POA: Diagnosis not present

## 2017-09-04 DIAGNOSIS — H5711 Ocular pain, right eye: Secondary | ICD-10-CM | POA: Diagnosis not present

## 2017-09-25 DIAGNOSIS — I10 Essential (primary) hypertension: Secondary | ICD-10-CM | POA: Diagnosis not present

## 2017-09-25 DIAGNOSIS — E119 Type 2 diabetes mellitus without complications: Secondary | ICD-10-CM | POA: Diagnosis not present

## 2017-09-25 DIAGNOSIS — E785 Hyperlipidemia, unspecified: Secondary | ICD-10-CM | POA: Diagnosis not present

## 2017-09-27 DIAGNOSIS — I10 Essential (primary) hypertension: Secondary | ICD-10-CM | POA: Diagnosis not present

## 2017-09-27 DIAGNOSIS — E785 Hyperlipidemia, unspecified: Secondary | ICD-10-CM | POA: Diagnosis not present

## 2017-09-27 DIAGNOSIS — Z23 Encounter for immunization: Secondary | ICD-10-CM | POA: Diagnosis not present

## 2017-09-27 DIAGNOSIS — E119 Type 2 diabetes mellitus without complications: Secondary | ICD-10-CM | POA: Diagnosis not present

## 2017-09-27 DIAGNOSIS — F17210 Nicotine dependence, cigarettes, uncomplicated: Secondary | ICD-10-CM | POA: Diagnosis not present

## 2017-10-04 DIAGNOSIS — H43813 Vitreous degeneration, bilateral: Secondary | ICD-10-CM | POA: Diagnosis not present

## 2017-10-04 DIAGNOSIS — H353124 Nonexudative age-related macular degeneration, left eye, advanced atrophic with subfoveal involvement: Secondary | ICD-10-CM | POA: Diagnosis not present

## 2017-10-04 DIAGNOSIS — H353211 Exudative age-related macular degeneration, right eye, with active choroidal neovascularization: Secondary | ICD-10-CM | POA: Diagnosis not present

## 2017-10-04 DIAGNOSIS — H35423 Microcystoid degeneration of retina, bilateral: Secondary | ICD-10-CM | POA: Diagnosis not present

## 2017-11-05 DIAGNOSIS — H353124 Nonexudative age-related macular degeneration, left eye, advanced atrophic with subfoveal involvement: Secondary | ICD-10-CM | POA: Diagnosis not present

## 2017-11-05 DIAGNOSIS — H35423 Microcystoid degeneration of retina, bilateral: Secondary | ICD-10-CM | POA: Diagnosis not present

## 2017-11-05 DIAGNOSIS — H353211 Exudative age-related macular degeneration, right eye, with active choroidal neovascularization: Secondary | ICD-10-CM | POA: Diagnosis not present

## 2017-11-05 DIAGNOSIS — R6889 Other general symptoms and signs: Secondary | ICD-10-CM | POA: Diagnosis not present

## 2017-11-05 DIAGNOSIS — H43813 Vitreous degeneration, bilateral: Secondary | ICD-10-CM | POA: Diagnosis not present

## 2017-12-05 DIAGNOSIS — H353124 Nonexudative age-related macular degeneration, left eye, advanced atrophic with subfoveal involvement: Secondary | ICD-10-CM | POA: Diagnosis not present

## 2017-12-05 DIAGNOSIS — H353211 Exudative age-related macular degeneration, right eye, with active choroidal neovascularization: Secondary | ICD-10-CM | POA: Diagnosis not present

## 2017-12-05 DIAGNOSIS — H43813 Vitreous degeneration, bilateral: Secondary | ICD-10-CM | POA: Diagnosis not present

## 2017-12-05 DIAGNOSIS — H35423 Microcystoid degeneration of retina, bilateral: Secondary | ICD-10-CM | POA: Diagnosis not present

## 2017-12-07 DIAGNOSIS — I10 Essential (primary) hypertension: Secondary | ICD-10-CM | POA: Diagnosis not present

## 2017-12-07 DIAGNOSIS — E119 Type 2 diabetes mellitus without complications: Secondary | ICD-10-CM | POA: Diagnosis not present

## 2017-12-07 DIAGNOSIS — L739 Follicular disorder, unspecified: Secondary | ICD-10-CM | POA: Diagnosis not present

## 2017-12-27 DIAGNOSIS — H02834 Dermatochalasis of left upper eyelid: Secondary | ICD-10-CM | POA: Diagnosis not present

## 2017-12-27 DIAGNOSIS — H02413 Mechanical ptosis of bilateral eyelids: Secondary | ICD-10-CM | POA: Diagnosis not present

## 2017-12-27 DIAGNOSIS — H53483 Generalized contraction of visual field, bilateral: Secondary | ICD-10-CM | POA: Diagnosis not present

## 2017-12-27 DIAGNOSIS — H0279 Other degenerative disorders of eyelid and periocular area: Secondary | ICD-10-CM | POA: Diagnosis not present

## 2017-12-27 DIAGNOSIS — H02423 Myogenic ptosis of bilateral eyelids: Secondary | ICD-10-CM | POA: Diagnosis not present

## 2017-12-27 DIAGNOSIS — H02831 Dermatochalasis of right upper eyelid: Secondary | ICD-10-CM | POA: Diagnosis not present

## 2017-12-31 DIAGNOSIS — M25561 Pain in right knee: Secondary | ICD-10-CM | POA: Diagnosis not present

## 2017-12-31 DIAGNOSIS — M47817 Spondylosis without myelopathy or radiculopathy, lumbosacral region: Secondary | ICD-10-CM | POA: Diagnosis not present

## 2017-12-31 DIAGNOSIS — M1711 Unilateral primary osteoarthritis, right knee: Secondary | ICD-10-CM | POA: Diagnosis not present

## 2017-12-31 DIAGNOSIS — M545 Low back pain: Secondary | ICD-10-CM | POA: Diagnosis not present

## 2018-01-03 DIAGNOSIS — H353124 Nonexudative age-related macular degeneration, left eye, advanced atrophic with subfoveal involvement: Secondary | ICD-10-CM | POA: Diagnosis not present

## 2018-01-03 DIAGNOSIS — H43813 Vitreous degeneration, bilateral: Secondary | ICD-10-CM | POA: Diagnosis not present

## 2018-01-03 DIAGNOSIS — H353211 Exudative age-related macular degeneration, right eye, with active choroidal neovascularization: Secondary | ICD-10-CM | POA: Diagnosis not present

## 2018-01-03 DIAGNOSIS — H35371 Puckering of macula, right eye: Secondary | ICD-10-CM | POA: Diagnosis not present

## 2018-01-29 DIAGNOSIS — M5136 Other intervertebral disc degeneration, lumbar region: Secondary | ICD-10-CM | POA: Diagnosis not present

## 2018-02-07 DIAGNOSIS — H353211 Exudative age-related macular degeneration, right eye, with active choroidal neovascularization: Secondary | ICD-10-CM | POA: Diagnosis not present

## 2018-02-07 DIAGNOSIS — H43813 Vitreous degeneration, bilateral: Secondary | ICD-10-CM | POA: Diagnosis not present

## 2018-02-07 DIAGNOSIS — H35371 Puckering of macula, right eye: Secondary | ICD-10-CM | POA: Diagnosis not present

## 2018-02-07 DIAGNOSIS — H353124 Nonexudative age-related macular degeneration, left eye, advanced atrophic with subfoveal involvement: Secondary | ICD-10-CM | POA: Diagnosis not present

## 2018-02-13 DIAGNOSIS — Z79899 Other long term (current) drug therapy: Secondary | ICD-10-CM | POA: Diagnosis not present

## 2018-02-13 DIAGNOSIS — F17210 Nicotine dependence, cigarettes, uncomplicated: Secondary | ICD-10-CM | POA: Diagnosis not present

## 2018-02-13 DIAGNOSIS — L03012 Cellulitis of left finger: Secondary | ICD-10-CM | POA: Diagnosis not present

## 2018-02-13 DIAGNOSIS — L739 Follicular disorder, unspecified: Secondary | ICD-10-CM | POA: Diagnosis not present

## 2018-02-21 DIAGNOSIS — H02413 Mechanical ptosis of bilateral eyelids: Secondary | ICD-10-CM | POA: Diagnosis not present

## 2018-02-21 DIAGNOSIS — H53453 Other localized visual field defect, bilateral: Secondary | ICD-10-CM | POA: Diagnosis not present

## 2018-02-21 DIAGNOSIS — R6889 Other general symptoms and signs: Secondary | ICD-10-CM | POA: Diagnosis not present

## 2018-02-21 DIAGNOSIS — H02834 Dermatochalasis of left upper eyelid: Secondary | ICD-10-CM | POA: Diagnosis not present

## 2018-02-21 DIAGNOSIS — H02831 Dermatochalasis of right upper eyelid: Secondary | ICD-10-CM | POA: Diagnosis not present

## 2018-02-21 DIAGNOSIS — H02423 Myogenic ptosis of bilateral eyelids: Secondary | ICD-10-CM | POA: Diagnosis not present

## 2018-03-14 DIAGNOSIS — H353211 Exudative age-related macular degeneration, right eye, with active choroidal neovascularization: Secondary | ICD-10-CM | POA: Diagnosis not present

## 2018-03-14 DIAGNOSIS — H35371 Puckering of macula, right eye: Secondary | ICD-10-CM | POA: Diagnosis not present

## 2018-03-14 DIAGNOSIS — H35423 Microcystoid degeneration of retina, bilateral: Secondary | ICD-10-CM | POA: Diagnosis not present

## 2018-03-14 DIAGNOSIS — H353124 Nonexudative age-related macular degeneration, left eye, advanced atrophic with subfoveal involvement: Secondary | ICD-10-CM | POA: Diagnosis not present

## 2018-04-10 DIAGNOSIS — I10 Essential (primary) hypertension: Secondary | ICD-10-CM | POA: Diagnosis not present

## 2018-04-10 DIAGNOSIS — R52 Pain, unspecified: Secondary | ICD-10-CM | POA: Diagnosis not present

## 2018-04-10 DIAGNOSIS — L0231 Cutaneous abscess of buttock: Secondary | ICD-10-CM | POA: Diagnosis not present

## 2018-04-11 DIAGNOSIS — I10 Essential (primary) hypertension: Secondary | ICD-10-CM | POA: Diagnosis not present

## 2018-04-11 DIAGNOSIS — L0231 Cutaneous abscess of buttock: Secondary | ICD-10-CM | POA: Diagnosis not present

## 2018-04-11 DIAGNOSIS — Z79899 Other long term (current) drug therapy: Secondary | ICD-10-CM | POA: Diagnosis not present

## 2018-04-15 DIAGNOSIS — L0231 Cutaneous abscess of buttock: Secondary | ICD-10-CM | POA: Diagnosis not present

## 2018-04-18 DIAGNOSIS — H35371 Puckering of macula, right eye: Secondary | ICD-10-CM | POA: Diagnosis not present

## 2018-04-18 DIAGNOSIS — H353211 Exudative age-related macular degeneration, right eye, with active choroidal neovascularization: Secondary | ICD-10-CM | POA: Diagnosis not present

## 2018-04-18 DIAGNOSIS — H35423 Microcystoid degeneration of retina, bilateral: Secondary | ICD-10-CM | POA: Diagnosis not present

## 2018-04-18 DIAGNOSIS — H353124 Nonexudative age-related macular degeneration, left eye, advanced atrophic with subfoveal involvement: Secondary | ICD-10-CM | POA: Diagnosis not present

## 2018-04-23 ENCOUNTER — Ambulatory Visit: Payer: Medicare HMO | Admitting: General Surgery

## 2018-05-01 DIAGNOSIS — L0231 Cutaneous abscess of buttock: Secondary | ICD-10-CM | POA: Diagnosis not present

## 2018-05-06 DIAGNOSIS — H524 Presbyopia: Secondary | ICD-10-CM | POA: Diagnosis not present

## 2018-05-06 DIAGNOSIS — I10 Essential (primary) hypertension: Secondary | ICD-10-CM | POA: Diagnosis not present

## 2018-05-06 DIAGNOSIS — E109 Type 1 diabetes mellitus without complications: Secondary | ICD-10-CM | POA: Diagnosis not present

## 2018-05-09 DIAGNOSIS — I10 Essential (primary) hypertension: Secondary | ICD-10-CM | POA: Diagnosis not present

## 2018-05-09 DIAGNOSIS — J019 Acute sinusitis, unspecified: Secondary | ICD-10-CM | POA: Diagnosis not present

## 2018-05-09 DIAGNOSIS — R6883 Chills (without fever): Secondary | ICD-10-CM | POA: Diagnosis not present

## 2018-05-09 DIAGNOSIS — Z79899 Other long term (current) drug therapy: Secondary | ICD-10-CM | POA: Diagnosis not present

## 2018-05-09 DIAGNOSIS — K219 Gastro-esophageal reflux disease without esophagitis: Secondary | ICD-10-CM | POA: Diagnosis not present

## 2018-05-10 DIAGNOSIS — H26493 Other secondary cataract, bilateral: Secondary | ICD-10-CM | POA: Diagnosis not present

## 2018-05-10 DIAGNOSIS — H26492 Other secondary cataract, left eye: Secondary | ICD-10-CM | POA: Diagnosis not present

## 2018-05-10 DIAGNOSIS — H26491 Other secondary cataract, right eye: Secondary | ICD-10-CM | POA: Diagnosis not present

## 2018-05-23 DIAGNOSIS — H35371 Puckering of macula, right eye: Secondary | ICD-10-CM | POA: Diagnosis not present

## 2018-05-23 DIAGNOSIS — H353124 Nonexudative age-related macular degeneration, left eye, advanced atrophic with subfoveal involvement: Secondary | ICD-10-CM | POA: Diagnosis not present

## 2018-05-23 DIAGNOSIS — H353211 Exudative age-related macular degeneration, right eye, with active choroidal neovascularization: Secondary | ICD-10-CM | POA: Diagnosis not present

## 2018-05-23 DIAGNOSIS — H43813 Vitreous degeneration, bilateral: Secondary | ICD-10-CM | POA: Diagnosis not present

## 2018-05-31 DIAGNOSIS — Z125 Encounter for screening for malignant neoplasm of prostate: Secondary | ICD-10-CM | POA: Diagnosis not present

## 2018-05-31 DIAGNOSIS — I10 Essential (primary) hypertension: Secondary | ICD-10-CM | POA: Diagnosis not present

## 2018-05-31 DIAGNOSIS — E119 Type 2 diabetes mellitus without complications: Secondary | ICD-10-CM | POA: Diagnosis not present

## 2018-06-04 DIAGNOSIS — R011 Cardiac murmur, unspecified: Secondary | ICD-10-CM | POA: Diagnosis not present

## 2018-06-04 DIAGNOSIS — Z13820 Encounter for screening for osteoporosis: Secondary | ICD-10-CM | POA: Diagnosis not present

## 2018-06-04 DIAGNOSIS — K219 Gastro-esophageal reflux disease without esophagitis: Secondary | ICD-10-CM | POA: Diagnosis not present

## 2018-06-04 DIAGNOSIS — Z23 Encounter for immunization: Secondary | ICD-10-CM | POA: Diagnosis not present

## 2018-06-04 DIAGNOSIS — M545 Low back pain: Secondary | ICD-10-CM | POA: Diagnosis not present

## 2018-06-04 DIAGNOSIS — Z0000 Encounter for general adult medical examination without abnormal findings: Secondary | ICD-10-CM | POA: Diagnosis not present

## 2018-06-04 DIAGNOSIS — M25561 Pain in right knee: Secondary | ICD-10-CM | POA: Diagnosis not present

## 2018-06-04 DIAGNOSIS — M542 Cervicalgia: Secondary | ICD-10-CM | POA: Diagnosis not present

## 2018-06-04 DIAGNOSIS — Z1211 Encounter for screening for malignant neoplasm of colon: Secondary | ICD-10-CM | POA: Diagnosis not present

## 2018-06-04 DIAGNOSIS — E119 Type 2 diabetes mellitus without complications: Secondary | ICD-10-CM | POA: Diagnosis not present

## 2018-06-10 ENCOUNTER — Other Ambulatory Visit (HOSPITAL_COMMUNITY): Payer: Self-pay | Admitting: *Deleted

## 2018-06-10 ENCOUNTER — Ambulatory Visit (HOSPITAL_COMMUNITY)
Admission: RE | Admit: 2018-06-10 | Discharge: 2018-06-10 | Disposition: A | Discharge status: Home / Self Care | Payer: Medicare HMO | Source: Ambulatory Visit | Attending: Internal Medicine | Admitting: Internal Medicine

## 2018-06-10 DIAGNOSIS — M542 Cervicalgia: Secondary | ICD-10-CM | POA: Diagnosis present

## 2018-06-10 DIAGNOSIS — M419 Scoliosis, unspecified: Secondary | ICD-10-CM | POA: Diagnosis not present

## 2018-06-10 DIAGNOSIS — I700 Atherosclerosis of aorta: Secondary | ICD-10-CM | POA: Insufficient documentation

## 2018-06-10 DIAGNOSIS — I6522 Occlusion and stenosis of left carotid artery: Secondary | ICD-10-CM | POA: Diagnosis not present

## 2018-06-10 DIAGNOSIS — M40292 Other kyphosis, cervical region: Secondary | ICD-10-CM | POA: Insufficient documentation

## 2018-06-10 DIAGNOSIS — M47812 Spondylosis without myelopathy or radiculopathy, cervical region: Secondary | ICD-10-CM | POA: Diagnosis not present

## 2018-06-11 ENCOUNTER — Other Ambulatory Visit (HOSPITAL_COMMUNITY): Payer: Self-pay | Admitting: Family Medicine

## 2018-06-11 DIAGNOSIS — Z79899 Other long term (current) drug therapy: Secondary | ICD-10-CM

## 2018-06-18 DIAGNOSIS — M5010 Cervical disc disorder with radiculopathy, unspecified cervical region: Secondary | ICD-10-CM | POA: Diagnosis not present

## 2018-06-18 DIAGNOSIS — R937 Abnormal findings on diagnostic imaging of other parts of musculoskeletal system: Secondary | ICD-10-CM | POA: Diagnosis not present

## 2018-06-18 DIAGNOSIS — M40202 Unspecified kyphosis, cervical region: Secondary | ICD-10-CM | POA: Diagnosis not present

## 2018-06-18 DIAGNOSIS — M40292 Other kyphosis, cervical region: Secondary | ICD-10-CM | POA: Diagnosis not present

## 2018-06-18 DIAGNOSIS — M542 Cervicalgia: Secondary | ICD-10-CM | POA: Diagnosis not present

## 2018-06-18 DIAGNOSIS — M47812 Spondylosis without myelopathy or radiculopathy, cervical region: Secondary | ICD-10-CM | POA: Diagnosis not present

## 2018-06-19 ENCOUNTER — Other Ambulatory Visit (HOSPITAL_COMMUNITY): Payer: Self-pay | Admitting: Family Medicine

## 2018-06-19 DIAGNOSIS — R937 Abnormal findings on diagnostic imaging of other parts of musculoskeletal system: Secondary | ICD-10-CM

## 2018-06-19 DIAGNOSIS — M40202 Unspecified kyphosis, cervical region: Secondary | ICD-10-CM

## 2018-06-19 DIAGNOSIS — M542 Cervicalgia: Secondary | ICD-10-CM

## 2018-06-28 ENCOUNTER — Telehealth: Payer: Self-pay | Admitting: Family Medicine

## 2018-07-01 ENCOUNTER — Ambulatory Visit (HOSPITAL_COMMUNITY): Payer: Medicare HMO

## 2018-07-01 ENCOUNTER — Encounter (HOSPITAL_COMMUNITY): Payer: Self-pay

## 2018-07-01 ENCOUNTER — Other Ambulatory Visit (HOSPITAL_COMMUNITY): Payer: Medicare HMO

## 2018-07-04 DIAGNOSIS — Z1283 Encounter for screening for malignant neoplasm of skin: Secondary | ICD-10-CM | POA: Diagnosis not present

## 2018-07-04 DIAGNOSIS — L821 Other seborrheic keratosis: Secondary | ICD-10-CM | POA: Diagnosis not present

## 2018-07-05 ENCOUNTER — Ambulatory Visit (HOSPITAL_COMMUNITY)
Admission: RE | Admit: 2018-07-05 | Discharge: 2018-07-05 | Disposition: A | Discharge status: Home / Self Care | Payer: Medicare HMO | Source: Ambulatory Visit | Attending: Family Medicine | Admitting: Family Medicine

## 2018-07-05 DIAGNOSIS — M4802 Spinal stenosis, cervical region: Secondary | ICD-10-CM | POA: Diagnosis not present

## 2018-07-05 DIAGNOSIS — M810 Age-related osteoporosis without current pathological fracture: Secondary | ICD-10-CM | POA: Insufficient documentation

## 2018-07-05 DIAGNOSIS — M542 Cervicalgia: Secondary | ICD-10-CM | POA: Diagnosis not present

## 2018-07-05 DIAGNOSIS — M50222 Other cervical disc displacement at C5-C6 level: Secondary | ICD-10-CM | POA: Insufficient documentation

## 2018-07-05 DIAGNOSIS — Z13820 Encounter for screening for osteoporosis: Secondary | ICD-10-CM | POA: Insufficient documentation

## 2018-07-05 DIAGNOSIS — R937 Abnormal findings on diagnostic imaging of other parts of musculoskeletal system: Secondary | ICD-10-CM

## 2018-07-05 DIAGNOSIS — M40202 Unspecified kyphosis, cervical region: Secondary | ICD-10-CM

## 2018-07-05 DIAGNOSIS — Z79899 Other long term (current) drug therapy: Secondary | ICD-10-CM | POA: Diagnosis not present

## 2018-07-08 DIAGNOSIS — H353211 Exudative age-related macular degeneration, right eye, with active choroidal neovascularization: Secondary | ICD-10-CM | POA: Diagnosis not present

## 2018-08-06 DIAGNOSIS — Z1211 Encounter for screening for malignant neoplasm of colon: Secondary | ICD-10-CM | POA: Diagnosis not present

## 2018-08-06 DIAGNOSIS — Z1212 Encounter for screening for malignant neoplasm of rectum: Secondary | ICD-10-CM | POA: Diagnosis not present

## 2018-08-08 DIAGNOSIS — H353232 Exudative age-related macular degeneration, bilateral, with inactive choroidal neovascularization: Secondary | ICD-10-CM | POA: Diagnosis not present

## 2018-08-12 DIAGNOSIS — H353211 Exudative age-related macular degeneration, right eye, with active choroidal neovascularization: Secondary | ICD-10-CM | POA: Diagnosis not present

## 2018-08-12 DIAGNOSIS — H353124 Nonexudative age-related macular degeneration, left eye, advanced atrophic with subfoveal involvement: Secondary | ICD-10-CM | POA: Diagnosis not present

## 2018-08-12 DIAGNOSIS — H43822 Vitreomacular adhesion, left eye: Secondary | ICD-10-CM | POA: Diagnosis not present

## 2018-08-12 DIAGNOSIS — H35371 Puckering of macula, right eye: Secondary | ICD-10-CM | POA: Diagnosis not present

## 2018-08-14 LAB — COLOGUARD: COLOGUARD: POSITIVE

## 2018-08-23 DIAGNOSIS — E119 Type 2 diabetes mellitus without complications: Secondary | ICD-10-CM | POA: Diagnosis not present

## 2018-08-23 DIAGNOSIS — Z79899 Other long term (current) drug therapy: Secondary | ICD-10-CM | POA: Diagnosis not present

## 2018-08-23 DIAGNOSIS — E785 Hyperlipidemia, unspecified: Secondary | ICD-10-CM | POA: Diagnosis not present

## 2018-08-23 DIAGNOSIS — I10 Essential (primary) hypertension: Secondary | ICD-10-CM | POA: Diagnosis not present

## 2018-08-29 DIAGNOSIS — F17210 Nicotine dependence, cigarettes, uncomplicated: Secondary | ICD-10-CM | POA: Diagnosis not present

## 2018-08-29 DIAGNOSIS — R52 Pain, unspecified: Secondary | ICD-10-CM | POA: Diagnosis not present

## 2018-08-29 DIAGNOSIS — M542 Cervicalgia: Secondary | ICD-10-CM | POA: Diagnosis not present

## 2018-08-29 DIAGNOSIS — I10 Essential (primary) hypertension: Secondary | ICD-10-CM | POA: Diagnosis not present

## 2018-08-29 DIAGNOSIS — M545 Low back pain: Secondary | ICD-10-CM | POA: Diagnosis not present

## 2018-08-29 DIAGNOSIS — E119 Type 2 diabetes mellitus without complications: Secondary | ICD-10-CM | POA: Diagnosis not present

## 2018-08-29 DIAGNOSIS — Z1211 Encounter for screening for malignant neoplasm of colon: Secondary | ICD-10-CM | POA: Diagnosis not present

## 2018-09-03 ENCOUNTER — Encounter: Payer: Self-pay | Admitting: Internal Medicine

## 2018-09-10 ENCOUNTER — Telehealth: Payer: Self-pay

## 2018-09-10 NOTE — Telephone Encounter (Signed)
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 I called the [facility] and they are faxing results over to our office and they will be scanned in chart. Pt wants to have a TCS without being seen. He is scheduled for 10/15/18 and is aware that he needs an office visit before any procedure is scheudled.

## 2018-09-12 DIAGNOSIS — H353211 Exudative age-related macular degeneration, right eye, with active choroidal neovascularization: Secondary | ICD-10-CM | POA: Diagnosis not present

## 2018-09-12 DIAGNOSIS — H43822 Vitreomacular adhesion, left eye: Secondary | ICD-10-CM | POA: Diagnosis not present

## 2018-09-12 DIAGNOSIS — H35371 Puckering of macula, right eye: Secondary | ICD-10-CM | POA: Diagnosis not present

## 2018-09-12 DIAGNOSIS — H353124 Nonexudative age-related macular degeneration, left eye, advanced atrophic with subfoveal involvement: Secondary | ICD-10-CM | POA: Diagnosis not present

## 2018-09-15 IMAGING — MR MR CERVICAL SPINE W/O CM
4 of 5 series · 14 of 48 positions shown · non-contrast
Comparison: Cervical spine x-rays dated June 10, 2018.

CLINICAL DATA: Chronic neck pain.

EXAM:
MRI CERVICAL SPINE WITHOUT CONTRAST
TECHNIQUE: Multiplanar, multisequence MR imaging of the cervical spine was
performed. No intravenous contrast was administered.

[Series 3: T2 · sagittal · 3.0mm · 0.42mm/px · 5 of 15 slices shown (1 of 2)]
[im 1/15]
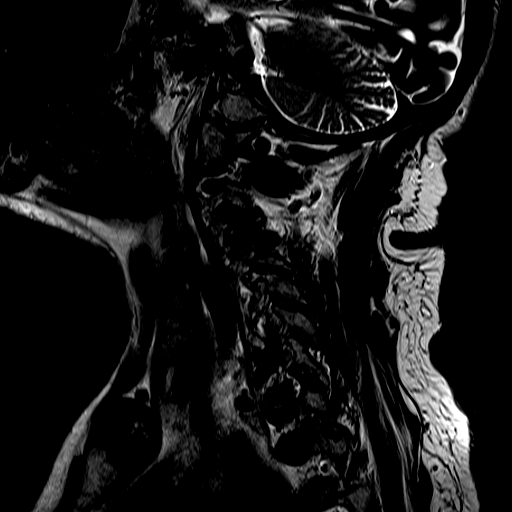
[im 3/15]
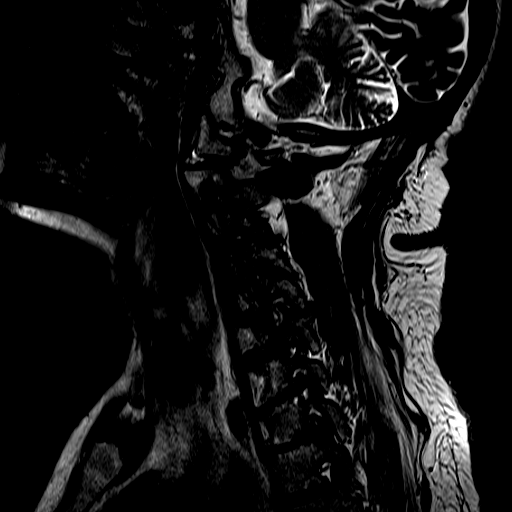
[im 6/15]
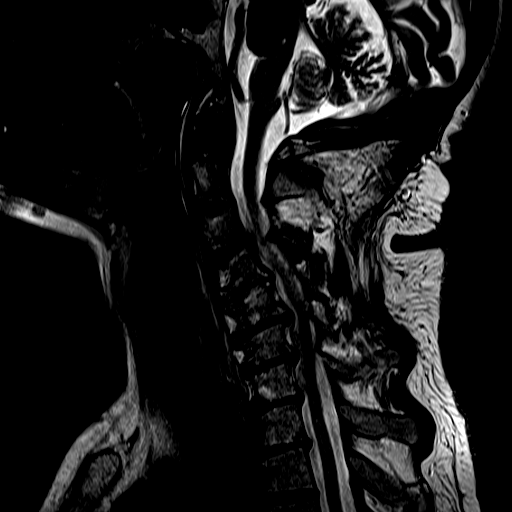
[im 9/15]
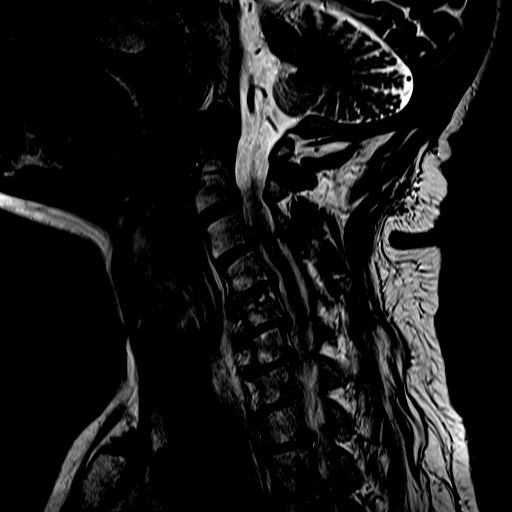
[im 15/15]
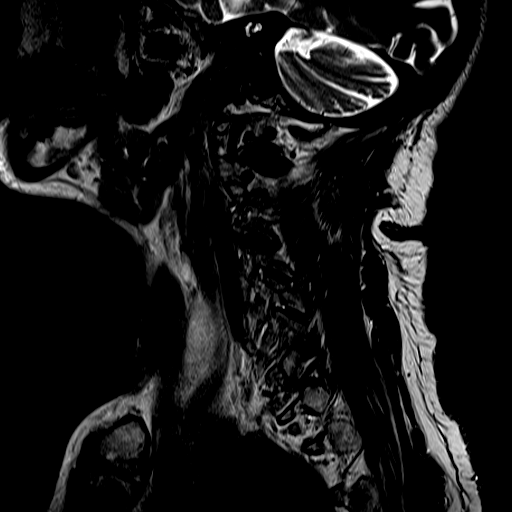

[Series 4: FLAIR · sagittal · 3.0mm · 0.48mm/px · 3 of 15 slices shown]
[im 3/15]
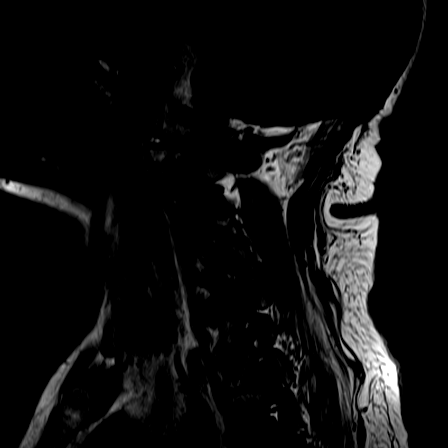
[im 9/15]
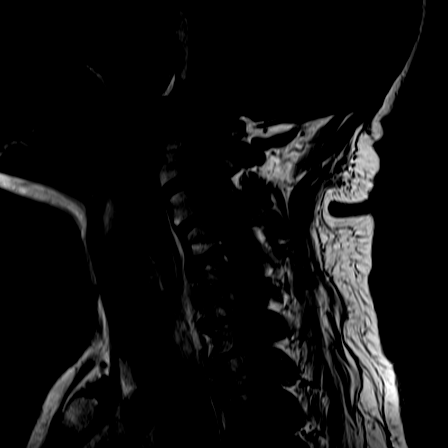
[im 15/15]
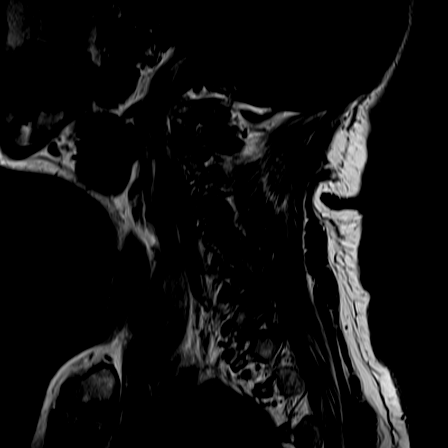

[Series 5: ir sagital · sagittal · 3.0mm · 0.24mm/px · 3 of 15 slices shown]
[im 3/15]
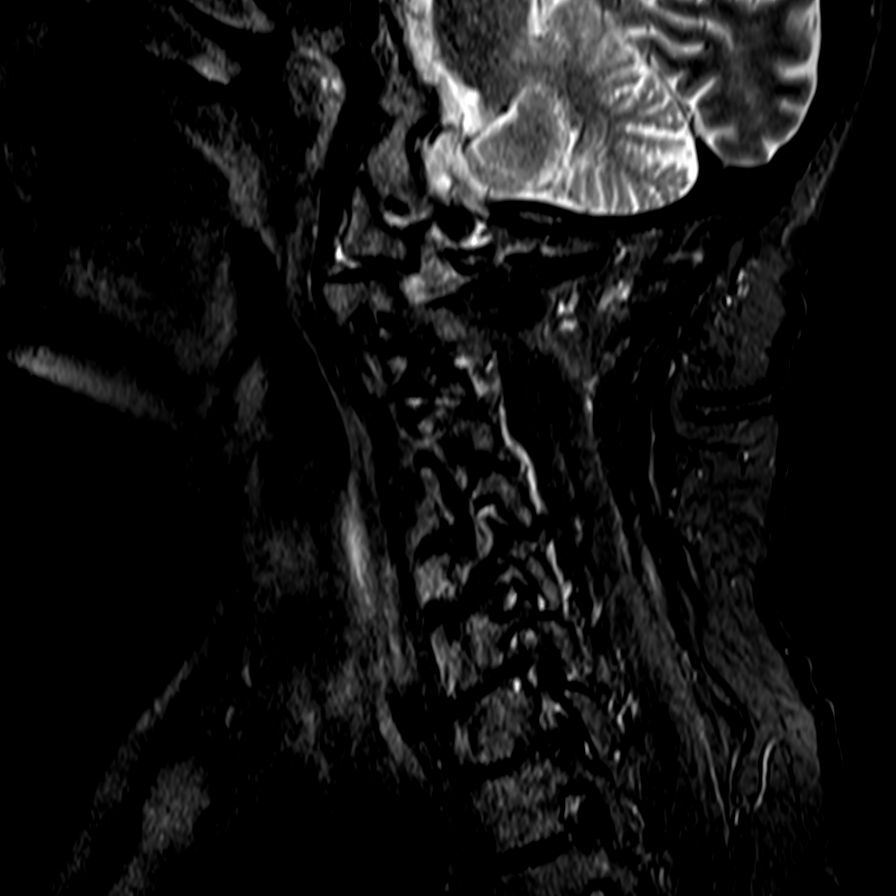
[im 9/15]
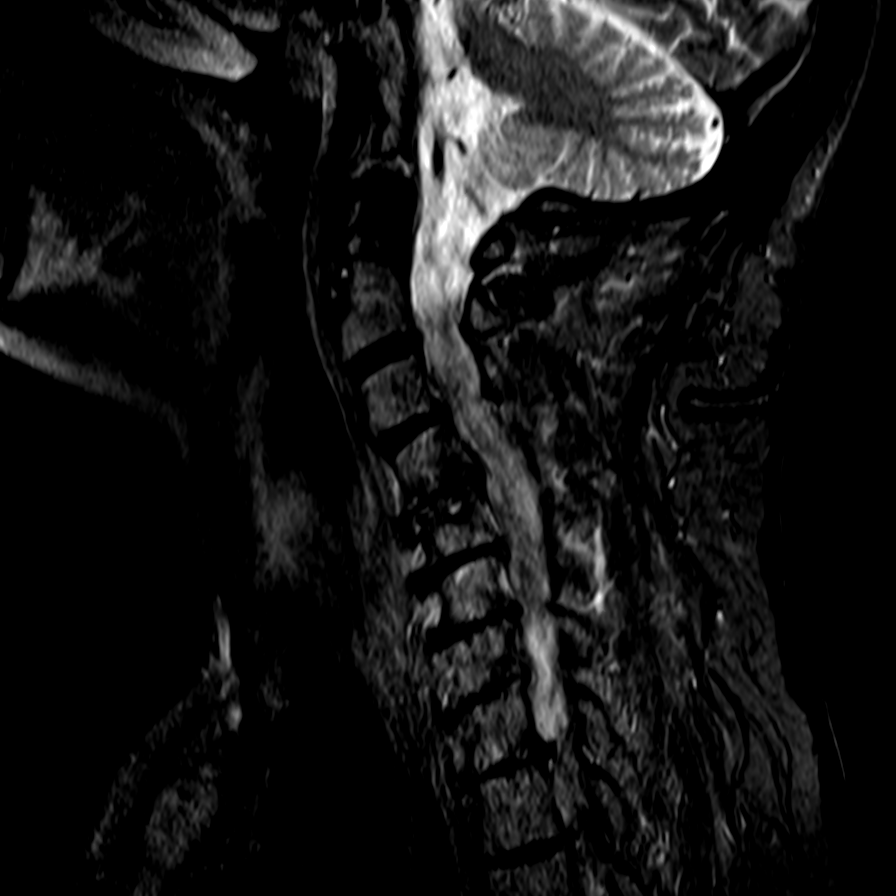
[im 15/15]
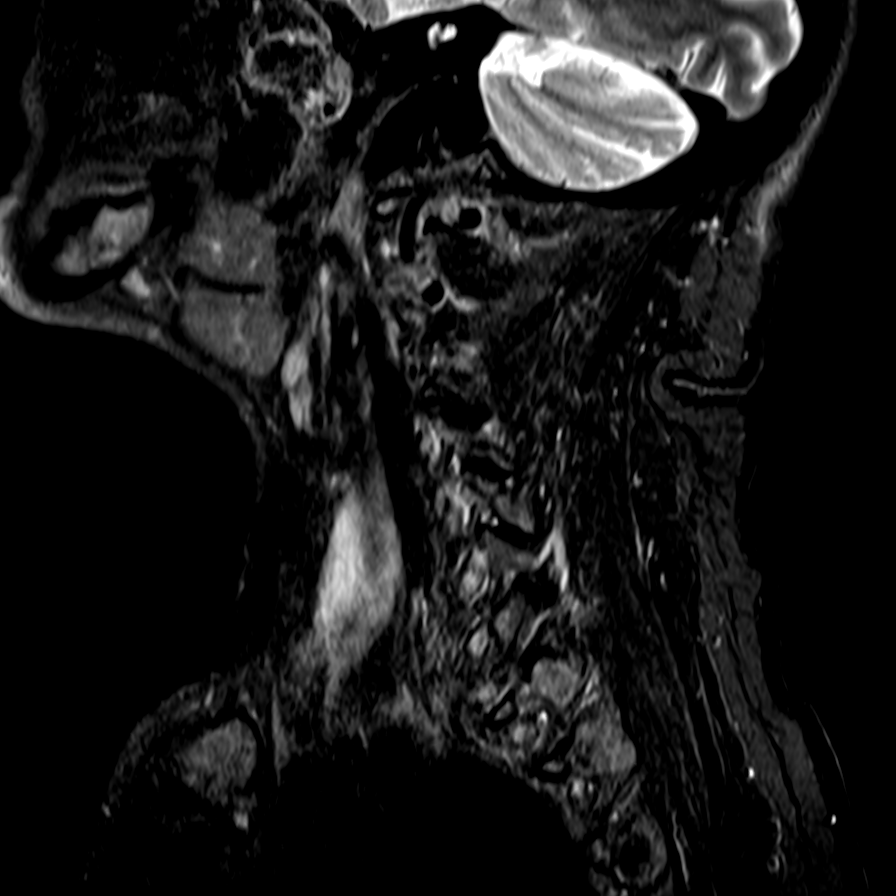

[Series 7: T2 · axial · 3.0mm · 0.23mm/px · z∈[-18,+63]mm · 3 of 38 slices shown (2 of 2)]
[im 6/38]
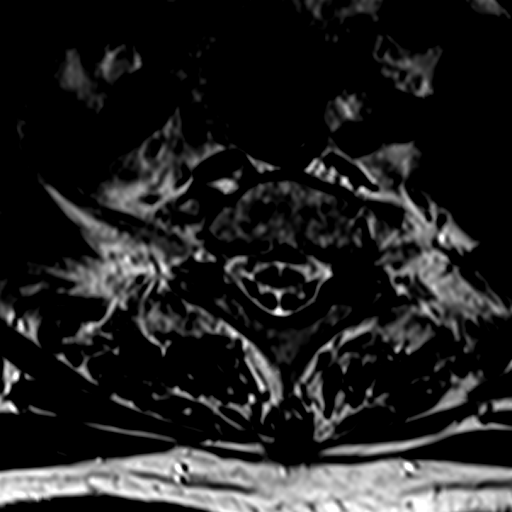
[im 19/38]
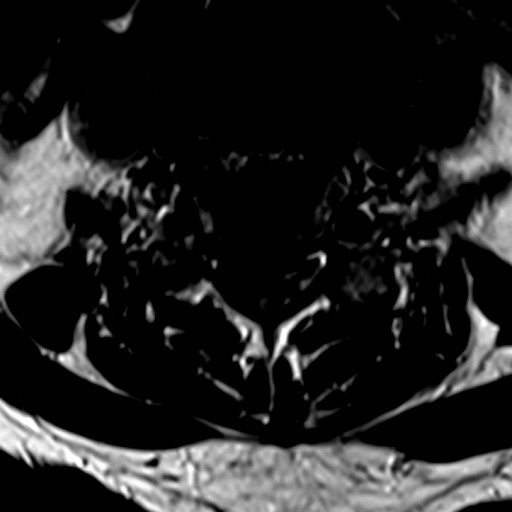
[im 32/38]
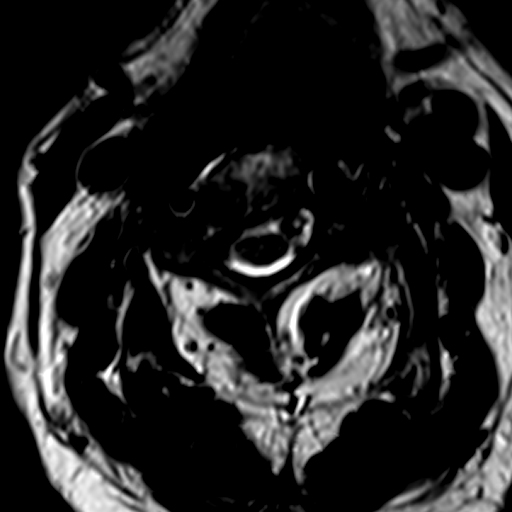

[14 of 48 positions shown; findings below may reference images not displayed]

FINDINGS: Alignment: Cervical kyphosis again noted, apex at C5. Trace
anterolisthesis at C3-C4. Trace stepwise retrolisthesis at C5-C6 and
C6-C7.

Vertebrae: No fracture, evidence of discitis, or bone lesion.
Degenerative endplate changes from C4-C5 through T1-T2.

Cord: Normal in signal.

Posterior Fossa, vertebral arteries, paraspinal tissues: Negative.

Disc levels:

C2-C3: Moderate to severe left facet arthropathy. Mild left
neuroforaminal stenosis. No spinal canal or right neuroforaminal
stenosis.

C3-C4: Posterior disc bulge and left greater than right facet
uncovertebral hypertrophy. Ligamentum flavum hypertrophy. Mild
central spinal canal stenosis. Moderate left and mild right
neuroforaminal stenosis.

C4-C5: Small posterior disc protrusion. Moderate bilateral facet
uncovertebral hypertrophy. Severe left and moderate right
neuroforaminal stenosis.

C5-C6: Large left paracentral disc protrusion deforming the left
ventral cord. Left greater than right facet uncovertebral
hypertrophy. Severe bilateral neuroforaminal stenosis.

C6-C7: Posterior disc osteophyte complex and bilateral facet
uncovertebral hypertrophy resulting in mild central spinal canal
stenosis and moderate bilateral neuroforaminal stenosis.

C7-T1: Small posterior disc osteophyte complex asymmetric to the
left. Left greater than right uncovertebral hypertrophy. Mild left
neuroforaminal stenosis. No spinal canal or right neuroforaminal
stenosis.

T1-T2: Small diffuse disc osteophyte complex. Mild bilateral
neuroforaminal stenosis. No spinal canal stenosis.
IMPRESSION: 1. Moderate degenerative changes of the cervical spine as described
above. Large left paracentral disc protrusion at C5-C6 deforms the
left ventral cord. Severe neuroforaminal stenosis on the left at
C4-C5 and bilaterally at C5-C6.
2. Moderate neuroforaminal stenosis on the left at C3-C4 and
bilaterally at C6-C7.
3. Mild central spinal canal stenosis at C3-C4 and C6-C7.

## 2018-10-07 DIAGNOSIS — M25561 Pain in right knee: Secondary | ICD-10-CM | POA: Diagnosis not present

## 2018-10-07 DIAGNOSIS — Z79891 Long term (current) use of opiate analgesic: Secondary | ICD-10-CM | POA: Diagnosis not present

## 2018-10-07 DIAGNOSIS — M542 Cervicalgia: Secondary | ICD-10-CM | POA: Diagnosis not present

## 2018-10-07 DIAGNOSIS — M5090 Cervical disc disorder, unspecified, unspecified cervical region: Secondary | ICD-10-CM | POA: Diagnosis not present

## 2018-10-07 DIAGNOSIS — M130 Polyarthritis, unspecified: Secondary | ICD-10-CM | POA: Diagnosis not present

## 2018-10-07 DIAGNOSIS — M545 Low back pain: Secondary | ICD-10-CM | POA: Diagnosis not present

## 2018-10-09 ENCOUNTER — Telehealth: Payer: Self-pay

## 2018-10-09 NOTE — Telephone Encounter (Signed)
-----   Message from Mahala Menghini, PA-C sent at 08/31/2018 10:41 PM EDT ----- Cologuard can be positive if blood in stool, advanced precancerous polyps, or colon cancer. Unfortunately PCPs are doing colon cancer screening beyond recommended age and when patient's are already up to date on screen. He will need ov to discuss next step, consider repeat tcs given missed polyp potential.  ----- Message ----- From: Claudina Lick, LPN Sent: 05/29/8005   4:40 PM EDT To: Mahala Menghini, PA-C  I received a referral on this pt for a colonoscopy, apparently his pcp did a cologuard on him and it was positive. He just had tcs with RMR in 2017- he had tubular adenomas. Isnt it true that the cologuard will be positive with tubular adenomas? Does he need an OV?

## 2018-10-09 NOTE — Telephone Encounter (Signed)
Pt has ov on 10/15/18 with RMR.

## 2018-10-15 ENCOUNTER — Encounter: Payer: Self-pay | Admitting: Internal Medicine

## 2018-10-15 ENCOUNTER — Encounter: Payer: Self-pay | Admitting: *Deleted

## 2018-10-15 ENCOUNTER — Telehealth: Payer: Self-pay | Admitting: *Deleted

## 2018-10-15 ENCOUNTER — Ambulatory Visit: Payer: Medicare HMO | Admitting: Internal Medicine

## 2018-10-15 ENCOUNTER — Other Ambulatory Visit: Payer: Self-pay | Admitting: *Deleted

## 2018-10-15 VITALS — BP 133/74 | HR 80 | Temp 97.0°F | Ht 70.0 in | Wt 189.6 lb

## 2018-10-15 DIAGNOSIS — Z86010 Personal history of colonic polyps: Secondary | ICD-10-CM

## 2018-10-15 MED ORDER — PEG 3350-KCL-NA BICARB-NACL 420 G PO SOLR: 4000.0000 mL | Freq: Once | ORAL | 0 refills | Status: AC

## 2018-10-15 NOTE — Telephone Encounter (Signed)
Pre-op scheduled for 11/20/18 at 11:00am. Patient aware. Letter mailed.

## 2018-10-15 NOTE — Progress Notes (Signed)
Primary Care Physician:  Jani Gravel, MD Primary Gastroenterologist:  Dr. [name]   Pre-Procedure History & Physical: HPI:  Todd George is a 77 y.o. male here for positive fecal DNA test (Cologuard).  However, patient has a history of colonic adenoma removed 2017 and was due for surveillance colonoscopy in early 2020.  He has not had any rectal bleeding or any other lower GI tract symptoms.  Past Medical History:  Diagnosis Date  . 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 BPH (benign prostatic hyperplasia)   . Carotid artery disease ([place])   . Cellulitis   . Chronic back pain   . Colon polyps 01/04/2016  . COPD (chronic obstructive pulmonary disease) ([place])    2ppd  . Diabetes mellitus without complication ([place])   . Duodenal erosion 01/02/2016  . Gastric erosion with bleeding 01/02/2016  . Hyperlipidemia   . Hypertension   . OSA on CPAP    not used machine in over 10 yrs    Past Surgical History:  Procedure Laterality Date  . CHOLECYSTECTOMY    . COLONOSCOPY N/A 01/03/2016   RMR: Active bleeding seen arising from the small bowel. Multiple cecal/ascending colon AVMs ablated. 1.5 cm carpet polyp in the ascending segment status post piecemeal snare polypectomy and tattooing. Innocent appearing colonic diverticulosis.  . COLONOSCOPY N/A 06/14/2016   Procedure: COLONOSCOPY;  Surgeon: Daneil Dolin, MD;  [facility];  Service: Endoscopy;  Laterality: N/A;  1200  . ESOPHAGOGASTRODUODENOSCOPY N/A 01/01/2016   RMR: Multiple gastric and duodenal erosions likely NSAID related.  Marland Kitchen GIVENS CAPSULE STUDY N/A 01/03/2016   Incomplete study, capsule never reach cecum. Scattered erosions and AVMs throughout the small bowel.  Marland Kitchen JOINT REPLACEMENT    . REPLACEMENT TOTAL KNEE Left   . SHOULDER ARTHROSCOPY WITH DISTAL CLAVICLE RESECTION Right 05/28/2015   Procedure: SHOULDER ARTHROSCOPY WITH DISTAL CLAVICLE RESECTION;  Surgeon: Melrose Nakayama, MD;  Location: [facility];  Service: Orthopedics;  Laterality: Right;  . SHOULDER ARTHROSCOPY WITH SUBACROMIAL DECOMPRESSION Right 05/28/2015   Procedure: SHOULDER ARTHROSCOPY WITH SUBACROMIAL DECOMPRESSION;  Surgeon: Melrose Nakayama, MD;  Location: [facility];  Service: Orthopedics;  Laterality: Right;  . THROAT SURGERY  1965    Prior to Admission medications   Medication Sig Start Date End Date Taking? Authorizing Provider  ACCU-CHEK AVIVA PLUS test strip  06/11/17  Yes [provider]  clotrimazole-betamethasone (LOTRISONE) cream Apply 1 application topically daily as needed (jock itch).    Yes [provider]  cyclobenzaprine (FLEXERIL) 10 MG tablet Take 10 mg by mouth 3 (three) times daily as needed for muscle spasms.   Yes [provider]  docusate sodium (COLACE) 100 MG capsule Take 100 mg by mouth daily as needed for mild constipation.    Yes [provider]  gabapentin (NEURONTIN) 100 MG capsule Take 1 capsule by mouth daily. 10/07/18  Yes [provider]  HYDROcodone-acetaminophen (NORCO/VICODIN) 5-325 MG tablet Take 1 tablet by mouth daily as needed for moderate pain.   Yes [provider]  lisinopril-hydrochlorothiazide (PRINZIDE,ZESTORETIC) 20-12.5 MG tablet Take 1 tablet by mouth daily.  05/16/17  Yes [provider]  losartan (COZAAR) 50 MG tablet Take 50 mg by mouth daily.  04/30/17  Yes [provider]  metFORMIN (GLUCOPHAGE) 500 MG tablet Take 500 mg by mouth daily.    Yes [provider]  metoprolol succinate (TOPROL-XL) 25 MG 24 hr tablet Take 25 mg by mouth daily.  Yes [provider]  Multiple Vitamins-Minerals (PRESERVISION AREDS 2) CAPS Take 2 capsules by mouth 2 (two) times daily. Reported on 06/01/2016   Yes [provider]  oxybutynin (DITROPAN-XL) 5 MG 24 hr tablet Take 5 mg by mouth at bedtime.   Yes [provider]  simvastatin (ZOCOR) 20 MG tablet Take 20 mg  by mouth daily.   Yes [provider]  tamsulosin (FLOMAX) 0.4 MG CAPS capsule Take 0.4 mg by mouth daily.    Yes [provider]  traMADol (ULTRAM) 50 MG tablet Take 50 mg by mouth daily as needed for moderate pain.   Yes [provider]  alclomethasone (ACLOVATE) 0.05 % cream Apply 1 application topically 2 (two) times daily.     [provider]  desonide (DESOWEN) 0.05 % cream Apply 1 application topically 2 (two) times daily as needed.    [provider]  ferrous sulfate 325 (65 FE) MG tablet Take 1 tablet (325 mg total) by mouth 2 (two) times daily with a meal. Patient not taking: Reported on 10/15/2018 02/01/16   Mahala Menghini, PA-C  methocarbamol (ROBAXIN) 500 MG tablet Take 500 mg by mouth daily as needed for muscle spasms.    [provider]  polyethylene glycol-electrolytes (TRILYTE) 420 g solution Take 4,000 mLs by mouth as directed. Patient not taking: Reported on 10/15/2018 06/01/16   Daneil Dolin, MD    Allergies as of 10/15/2018 - Review Complete 10/15/2018  Allergen Reaction Noted  . Nsaids Other (See Comments) 06/12/2017    No family history on file.  Social History   Socioeconomic History  . Marital status: Divorced    Spouse name: Not on file  . Number of children: Not on file  . Years of education: Not on file  . Highest education level: Not on file  Occupational History  . Not on file  Social Needs  . Financial resource strain: Not on file  . Food insecurity:    Worry: Not on file    Inability: Not on file  . Transportation needs:    Medical: Not on file    Non-medical: Not on file  Tobacco Use  . Smoking status: Current Every Day Smoker    Packs/day: 2.00    Years: 64.00    Pack years: 128.00  . Smokeless tobacco: Never Used  Substance and Sexual Activity  . Alcohol use: Yes    Alcohol/week: 0.0 standard drinks    Comment: rare  . Drug use: No  . Sexual activity: Never  Lifestyle  . Physical  activity:    Days per week: Not on file    Minutes per session: Not on file  . Stress: Not on file  Relationships  . Social connections:    Talks on phone: Not on file    Gets together: Not on file    Attends religious service: Not on file    Active member of club or organization: Not on file    Attends meetings of clubs or organizations: Not on file    Relationship status: Not on file  . Intimate partner violence:    Fear of current or ex partner: Not on file    Emotionally abused: Not on file    Physically abused: Not on file    Forced sexual activity: Not on file  Other Topics Concern  . Not on file  Social History Narrative   Divorced   Lives alone    [place] here from [place] in  Jan 2016   Retired - Agricultural consultant    Review of Systems: See HPI, otherwise negative ROS  Physical Exam: BP 133/74   Pulse 80   Temp (!) 97 F (36.1 C) (Oral)   Ht 5\' 10"  (1.778 m)   Wt 189 lb 9.6 oz (86 kg)   BMI 27.20 kg/m  General:   Alert, somewhat disheveled pleasant and cooperative in NAD Neck:  Supple; no masses or thyromegaly. No significant cervical adenopathy. Lungs:  Clear throughout to auscultation.   No wheezes, crackles, or rhonchi. No acute distress. Heart:  Regular rate and rhythm;  2/6 SEM,  no clicks, rubs,  or gallops. Abdomen: Non-distended, normal bowel sounds.  Soft and nontender without appreciable mass or hepatosplenomegaly.  Pulses:  Normal pulses noted. Extremities:  Without clubbing or edema.   Impression: Pleasant 77 year old gentleman with a history of colonic adenomas.  He is referred for a positive fecal DNA assay.  He is due for surveillance examination in the coming weeks anyway.  The risks, benefits, limitations, alternatives and imponderables have been reviewed with the patient. Questions have been answered. All parties are agreeable.  Hold evening dose of Glucophage the night before and morning dose the day of the procedure.  Further recommendations to  follow.   Recommendations: I have offered the patient a surveillance colonoscopy.The risks, benefits, limitations, alternatives and imponderables have been reviewed with the patient. Questions have been answered. All parties are agreeable.           Notice: This dictation was prepared with Dragon dictation along with smaller phrase technology. Any transcriptional errors that result from this process are unintentional and may not be corrected upon review.

## 2018-10-15 NOTE — Patient Instructions (Signed)
  Schedule a surveillance colonoscopy  -  Hx of polyps - propofol  Hold evening dose of glucophage night before and morning of colonsocopy

## 2018-10-17 DIAGNOSIS — H35371 Puckering of macula, right eye: Secondary | ICD-10-CM | POA: Diagnosis not present

## 2018-10-17 DIAGNOSIS — H353124 Nonexudative age-related macular degeneration, left eye, advanced atrophic with subfoveal involvement: Secondary | ICD-10-CM | POA: Diagnosis not present

## 2018-10-17 DIAGNOSIS — H353211 Exudative age-related macular degeneration, right eye, with active choroidal neovascularization: Secondary | ICD-10-CM | POA: Diagnosis not present

## 2018-10-24 DIAGNOSIS — Z23 Encounter for immunization: Secondary | ICD-10-CM | POA: Diagnosis not present

## 2018-11-11 NOTE — Patient Instructions (Signed)
Todd George  11/11/2018     @PREFPERIOPPHARMACY @   Your procedure is scheduled on  11/28/2018 .  Report to [facility] at  700   A.M.  Call this number if you have problems the morning of surgery:  [phone]   Remember:  Do not eat or drink after midnight.                       Take these medicines the morning of surgery with A SIP OF WATER  flexaril ( if needed), gabapentin, hydrocodone ( if needed), lisinopril, robaxin( if needed), metoprolol, flomax, ultram ( if needed).    Do not wear jewelry, make-up or nail polish.  Do not wear lotions, powders, or perfumes, or deodorant.  Do not shave 48 hours prior to surgery.  Men may shave face and neck.  Do not bring valuables to the hospital.  [facility] is not responsible for any belongings or valuables.  Contacts, dentures or bridgework may not be worn into surgery.  Leave your suitcase in the car.  After surgery it may be brought to your room.  For patients admitted to the hospital, discharge time will be determined by your treatment team.  Patients discharged the day of surgery will not be allowed to drive home.   Name and phone number of your driver:   family Special instructions:  None  Please read over the following fact sheets that you were given. Anesthesia Post-op Instructions and Care and Recovery After Surgery       Colonoscopy, Adult A colonoscopy is an exam to look at the large intestine. It is done to check for problems, such as:  Lumps (tumors).  Growths (polyps).  Swelling (inflammation).  Bleeding.  What happens before the procedure? Eating and drinking Follow instructions from your doctor about eating and drinking. These instructions may include:  A few days before the procedure - follow a low-fiber diet. ? Avoid nuts. ? Avoid seeds. ? Avoid dried fruit. ? Avoid raw fruits. ? Avoid vegetables.  1-3 days before the procedure - follow a clear liquid diet. Avoid liquids  that have red or purple dye. Drink only clear liquids, such as: ? Clear broth or bouillon. ? Black coffee or tea. ? Clear juice. ? Clear soft drinks or sports drinks. ? Gelatin dessert. ? Popsicles.  On the day of the procedure - do not eat or drink anything during the 2 hours before the procedure.  Bowel prep If you were prescribed an oral bowel prep:  Take it as told by your doctor. Starting the day before your procedure, you will need to drink a lot of liquid. The liquid will cause you to poop (have bowel movements) until your poop is almost clear or light green.  If your skin or butt gets irritated from diarrhea, you may: ? Wipe the area with wipes that have medicine in them, such as adult wet wipes with aloe and vitamin E. ? Put something on your skin that soothes the area, such as petroleum jelly.  If you throw up (vomit) while drinking the bowel prep, take a break for up to 60 minutes. Then begin the bowel prep again. If you keep throwing up and you cannot take the bowel prep without throwing up, call your doctor.  General instructions  Ask your doctor about changing or stopping your normal medicines. This is important if you take diabetes medicines or  blood thinners.  Plan to have someone take you home from the hospital or clinic. What happens during the procedure?  An IV tube may be put into one of your veins.  You will be given medicine to help you relax (sedative).  To reduce your risk of infection: ? Your doctors will wash their hands. ? Your anal area will be washed with soap.  You will be asked to lie on your side with your knees bent.  Your doctor will get a long, thin, flexible tube ready. The tube will have a camera and a light on the end.  The tube will be put into your anus.  The tube will be gently put into your large intestine.  Air will be delivered into your large intestine to keep it open. You may feel some pressure or cramping.  The camera will  be used to take photos.  A small tissue sample may be removed from your body to be looked at under a microscope (biopsy). If any possible problems are found, the tissue will be sent to a lab for testing.  If small growths are found, your doctor may remove them and have them checked for cancer.  The tube that was put into your anus will be slowly removed. The procedure may vary among doctors and hospitals. What happens after the procedure?  Your doctor will check on you often until the medicines you were given have worn off.  Do not drive for 24 hours after the procedure.  You may have a small amount of blood in your poop.  You may pass gas.  You may have mild cramps or bloating in your belly (abdomen).  It is up to you to get the results of your procedure. Ask your doctor, or the department performing the procedure, when your results will be ready. This information is not intended to replace advice given to you by your health care provider. Make sure you discuss any questions you have with your health care provider. Document Released: 01/13/2011 Document Revised: 10/11/2016 Document Reviewed: 02/22/2016 Elsevier Interactive Patient Education  2017 Elsevier Inc[employer]  Colonoscopy, Adult, Care After This sheet gives you information about how to care for yourself after your procedure. Your health care provider may also give you more specific instructions. If you have problems or questions, contact your health care provider. What can I expect after the procedure? After the procedure, it is common to have:  A small amount of blood in your stool for 24 hours after the procedure.  Some gas.  Mild abdominal cramping or bloating.  Follow these instructions at home: General instructions   For the first 24 hours after the procedure: ? Do not drive or use machinery. ? Do not sign important documents. ? Do not drink alcohol. ? Do your regular daily activities at a slower pace than  normal. ? Eat soft, easy-to-digest foods. ? Rest often.  Take over-the-counter or prescription medicines only as told by your health care provider.  It is up to you to get the results of your procedure. Ask your health care provider, or the department performing the procedure, when your results will be ready. Relieving cramping and bloating  Try walking around when you have cramps or feel bloated.  Apply heat to your abdomen as told by your health care provider. Use a heat source that your health care provider recommends, such as a moist heat pack or a heating pad. ? Place a towel between your skin and the heat  source. ? Leave the heat on for 20-30 minutes. ? Remove the heat if your skin turns bright red. This is especially important if you are unable to feel pain, heat, or cold. You may have a greater risk of getting burned. Eating and drinking  Drink enough fluid to keep your urine clear or pale yellow.  Resume your normal diet as instructed by your health care provider. Avoid heavy or fried foods that are hard to digest.  Avoid drinking alcohol for as long as instructed by your health care provider. Contact a health care provider if:  You have blood in your stool 2-3 days after the procedure. Get help right away if:  You have more than a small spotting of blood in your stool.  You pass large blood clots in your stool.  Your abdomen is swollen.  You have nausea or vomiting.  You have a fever.  You have increasing abdominal pain that is not relieved with medicine. This information is not intended to replace advice given to you by your health care provider. Make sure you discuss any questions you have with your health care provider. Document Released: 07/25/2004 Document Revised: 09/04/2016 Document Reviewed: 02/22/2016 Elsevier Interactive Patient Education  2018 [place] Anesthesia is a term that refers to techniques, procedures, and  medicines that help a person stay safe and comfortable during a medical procedure. Monitored anesthesia care, or sedation, is one type of anesthesia. Your anesthesia specialist may recommend sedation if you will be having a procedure that does not require you to be unconscious, such as:  Cataract surgery.  A dental procedure.  A biopsy.  A colonoscopy.  During the procedure, you may receive a medicine to help you relax (sedative). There are three levels of sedation:  Mild sedation. At this level, you may feel awake and relaxed. You will be able to follow directions.  Moderate sedation. At this level, you will be sleepy. You may not remember the procedure.  Deep sedation. At this level, you will be asleep. You will not remember the procedure.  The more medicine you are given, the deeper your level of sedation will be. Depending on how you respond to the procedure, the anesthesia specialist may change your level of sedation or the type of anesthesia to fit your needs. An anesthesia specialist will monitor you closely during the procedure. Let your health care provider know about:  Any allergies you have.  All medicines you are taking, including vitamins, herbs, eye drops, creams, and over-the-counter medicines.  Any use of steroids (by mouth or as a cream).  Any problems you or family members have had with sedatives and anesthetic medicines.  Any blood disorders you have.  Any surgeries you have had.  Any medical conditions you have, such as sleep apnea.  Whether you are pregnant or may be pregnant.  Any use of cigarettes, alcohol, or street drugs. What are the risks? Generally, this is a safe procedure. However, problems may occur, including:  Getting too much medicine (oversedation).  Nausea.  Allergic reaction to medicines.  Trouble breathing. If this happens, a breathing tube may be used to help with breathing. It will be removed when you are awake and breathing on  your own.  Heart trouble.  Lung trouble.  Before the procedure Staying hydrated Follow instructions from your health care provider about hydration, which may include:  Up to 2 hours before the procedure - you may continue to drink clear liquids, such as  water, clear fruit juice, black coffee, and plain tea.  Eating and drinking restrictions Follow instructions from your health care provider about eating and drinking, which may include:  8 hours before the procedure - stop eating heavy meals or foods such as meat, fried foods, or fatty foods.  6 hours before the procedure - stop eating light meals or foods, such as toast or cereal.  6 hours before the procedure - stop drinking milk or drinks that contain milk.  2 hours before the procedure - stop drinking clear liquids.  Medicines Ask your health care provider about:  Changing or stopping your regular medicines. This is especially important if you are taking diabetes medicines or blood thinners.  Taking medicines such as aspirin and ibuprofen. These medicines can thin your blood. Do not take these medicines before your procedure if your health care provider instructs you not to.  Tests and exams  You will have a physical exam.  You may have blood tests done to show: ? How well your kidneys and liver are working. ? How well your blood can clot.  General instructions  Plan to have someone take you home from the hospital or clinic.  If you will be going home right after the procedure, plan to have someone with you for 24 hours.  What happens during the procedure?  Your blood pressure, heart rate, breathing, level of pain and overall condition will be monitored.  An IV tube will be inserted into one of your veins.  Your anesthesia specialist will give you medicines as needed to keep you comfortable during the procedure. This may mean changing the level of sedation.  The procedure will be performed. After the  procedure  Your blood pressure, heart rate, breathing rate, and blood oxygen level will be monitored until the medicines you were given have worn off.  Do not drive for 24 hours if you received a sedative.  You may: ? Feel sleepy, clumsy, or nauseous. ? Feel forgetful about what happened after the procedure. ? Have a sore throat if you had a breathing tube during the procedure. ? Vomit. This information is not intended to replace advice given to you by your health care provider. Make sure you discuss any questions you have with your health care provider. Document Released: 09/06/2005 Document Revised: 05/19/2016 Document Reviewed: 04/02/2016 Elsevier Interactive Patient Education  2018 [place], Care After These instructions provide you with information about caring for yourself after your procedure. Your health care provider may also give you more specific instructions. Your treatment has been planned according to current medical practices, but problems sometimes occur. Call your health care provider if you have any problems or questions after your procedure. What can I expect after the procedure? After your procedure, it is common to:  Feel sleepy for several hours.  Feel clumsy and have poor balance for several hours.  Feel forgetful about what happened after the procedure.  Have poor judgment for several hours.  Feel nauseous or vomit.  Have a sore throat if you had a breathing tube during the procedure.  Follow these instructions at home: For at least 24 hours after the procedure:   Do not: ? Participate in activities in which you could fall or become injured. ? Drive. ? Use heavy machinery. ? Drink alcohol. ? Take sleeping pills or medicines that cause drowsiness. ? Make important decisions or sign legal documents. ? Take care of children on your own.  Rest. Eating and  drinking  Follow the diet that is recommended by your health  care provider.  If you vomit, drink water, juice, or soup when you can drink without vomiting.  Make sure you have little or no nausea before eating solid foods. General instructions  Have a responsible adult stay with you until you are awake and alert.  Take over-the-counter and prescription medicines only as told by your health care provider.  If you smoke, do not smoke without supervision.  Keep all follow-up visits as told by your health care provider. This is important. Contact a health care provider if:  You keep feeling nauseous or you keep vomiting.  You feel light-headed.  You develop a rash.  You have a fever. Get help right away if:  You have trouble breathing. This information is not intended to replace advice given to you by your health care provider. Make sure you discuss any questions you have with your health care provider. Document Released: 04/02/2016 Document Revised: 08/02/2016 Document Reviewed: 04/02/2016 Elsevier Interactive Patient Education  [employer].

## 2018-11-15 DIAGNOSIS — M545 Low back pain: Secondary | ICD-10-CM | POA: Diagnosis not present

## 2018-11-15 DIAGNOSIS — M5090 Cervical disc disorder, unspecified, unspecified cervical region: Secondary | ICD-10-CM | POA: Diagnosis not present

## 2018-11-15 DIAGNOSIS — M130 Polyarthritis, unspecified: Secondary | ICD-10-CM | POA: Diagnosis not present

## 2018-11-15 DIAGNOSIS — M25561 Pain in right knee: Secondary | ICD-10-CM | POA: Diagnosis not present

## 2018-11-18 DIAGNOSIS — Z6827 Body mass index (BMI) 27.0-27.9, adult: Secondary | ICD-10-CM | POA: Diagnosis not present

## 2018-11-18 DIAGNOSIS — M542 Cervicalgia: Secondary | ICD-10-CM | POA: Diagnosis not present

## 2018-11-18 DIAGNOSIS — M5416 Radiculopathy, lumbar region: Secondary | ICD-10-CM | POA: Diagnosis not present

## 2018-11-18 DIAGNOSIS — I10 Essential (primary) hypertension: Secondary | ICD-10-CM | POA: Diagnosis not present

## 2018-11-18 DIAGNOSIS — M545 Low back pain: Secondary | ICD-10-CM | POA: Diagnosis not present

## 2018-11-18 DIAGNOSIS — M4126 Other idiopathic scoliosis, lumbar region: Secondary | ICD-10-CM | POA: Diagnosis not present

## 2018-11-20 ENCOUNTER — Encounter (HOSPITAL_COMMUNITY)
Admission: RE | Admit: 2018-11-20 | Discharge: 2018-11-20 | Disposition: A | Discharge status: Home / Self Care | Payer: Medicare HMO | Source: Ambulatory Visit | Attending: Internal Medicine | Admitting: Internal Medicine

## 2018-11-20 ENCOUNTER — Encounter (HOSPITAL_COMMUNITY): Payer: Self-pay

## 2018-11-20 ENCOUNTER — Other Ambulatory Visit: Payer: Self-pay

## 2018-11-20 DIAGNOSIS — M40202 Unspecified kyphosis, cervical region: Secondary | ICD-10-CM | POA: Diagnosis not present

## 2018-11-20 DIAGNOSIS — E119 Type 2 diabetes mellitus without complications: Secondary | ICD-10-CM | POA: Diagnosis not present

## 2018-11-20 DIAGNOSIS — Z01818 Encounter for other preprocedural examination: Secondary | ICD-10-CM | POA: Insufficient documentation

## 2018-11-20 DIAGNOSIS — M40292 Other kyphosis, cervical region: Secondary | ICD-10-CM | POA: Diagnosis not present

## 2018-11-20 DIAGNOSIS — I10 Essential (primary) hypertension: Secondary | ICD-10-CM | POA: Diagnosis not present

## 2018-11-20 DIAGNOSIS — E785 Hyperlipidemia, unspecified: Secondary | ICD-10-CM | POA: Diagnosis not present

## 2018-11-20 DIAGNOSIS — R011 Cardiac murmur, unspecified: Secondary | ICD-10-CM | POA: Diagnosis not present

## 2018-11-20 DIAGNOSIS — M542 Cervicalgia: Secondary | ICD-10-CM | POA: Diagnosis not present

## 2018-11-20 HISTORY — DX: Unspecified osteoarthritis, unspecified site: M19.90

## 2018-11-20 HISTORY — DX: Malignant (primary) neoplasm, unspecified: C80.1

## 2018-11-20 LAB — CBC WITH DIFFERENTIAL/PLATELET
Abs Immature Granulocytes: 0.02 10*3/uL (ref 0.00–0.07)
BASOS ABS: 0.1 10*3/uL (ref 0.0–0.1)
BASOS PCT: 1 %
EOS ABS: 0.3 10*3/uL (ref 0.0–0.5)
Eosinophils Relative: 4 %
HCT: 43 % (ref 39.0–52.0)
Hemoglobin: 13.9 g/dL (ref 13.0–17.0)
IMMATURE GRANULOCYTES: 0 %
Lymphocytes Relative: 28 %
Lymphs Abs: 2.3 10*3/uL (ref 0.7–4.0)
MCH: 30.5 pg (ref 26.0–34.0)
MCHC: 32.3 g/dL (ref 30.0–36.0)
MCV: 94.5 fL (ref 80.0–100.0)
MONO ABS: 0.6 10*3/uL (ref 0.1–1.0)
Monocytes Relative: 8 %
NEUTROS ABS: 4.9 10*3/uL (ref 1.7–7.7)
Neutrophils Relative %: 59 %
PLATELETS: 199 10*3/uL (ref 150–400)
RBC: 4.55 MIL/uL (ref 4.22–5.81)
RDW: 14.7 % (ref 11.5–15.5)
WBC: 8.3 10*3/uL (ref 4.0–10.5)
nRBC: 0 % (ref 0.0–0.2)

## 2018-11-20 LAB — BASIC METABOLIC PANEL
Anion gap: 8 (ref 5–15)
BUN: 29 mg/dL — ABNORMAL HIGH (ref 8–23)
CALCIUM: 9.9 mg/dL (ref 8.9–10.3)
CO2: 29 mmol/L (ref 22–32)
Chloride: 100 mmol/L (ref 98–111)
Creatinine, Ser: 1.06 mg/dL (ref 0.61–1.24)
GLUCOSE: 94 mg/dL (ref 70–99)
Potassium: 3.7 mmol/L (ref 3.5–5.1)
Sodium: 137 mmol/L (ref 135–145)

## 2018-11-25 ENCOUNTER — Telehealth: Payer: Self-pay | Admitting: Internal Medicine

## 2018-11-25 DIAGNOSIS — H353124 Nonexudative age-related macular degeneration, left eye, advanced atrophic with subfoveal involvement: Secondary | ICD-10-CM | POA: Diagnosis not present

## 2018-11-25 DIAGNOSIS — H35423 Microcystoid degeneration of retina, bilateral: Secondary | ICD-10-CM | POA: Diagnosis not present

## 2018-11-25 DIAGNOSIS — H35371 Puckering of macula, right eye: Secondary | ICD-10-CM | POA: Diagnosis not present

## 2018-11-25 DIAGNOSIS — H353211 Exudative age-related macular degeneration, right eye, with active choroidal neovascularization: Secondary | ICD-10-CM | POA: Diagnosis not present

## 2018-11-25 MED ORDER — PEG 3350-KCL-NA BICARB-NACL 420 G PO SOLR: 4000.0000 mL | Freq: Once | ORAL | 0 refills | Status: AC

## 2018-11-25 NOTE — Telephone Encounter (Signed)
Called patient and made aware Rx was sent in when he was scheduled. He did not pick this up. New rx sent into [facility]

## 2018-11-25 NOTE — Telephone Encounter (Signed)
Pt said he was returning a call. Please call him at [phone]

## 2018-11-25 NOTE — Telephone Encounter (Signed)
[phone]  Patient called and said his prep is not at the pharmacy  Please give him a call

## 2018-11-25 NOTE — Telephone Encounter (Signed)
Spoke with patient and made aware I have not called since speaking with him earlier.

## 2018-11-25 NOTE — Addendum Note (Signed)
Addended by: Inge Rise on: 11/25/2018 08:37 AM   Modules accepted: Orders

## 2018-11-28 ENCOUNTER — Ambulatory Visit (HOSPITAL_COMMUNITY): Payer: Medicare HMO | Admitting: Anesthesiology

## 2018-11-28 ENCOUNTER — Ambulatory Visit (HOSPITAL_COMMUNITY)
Admission: RE | Admit: 2018-11-28 | Discharge: 2018-11-28 | Disposition: A | Discharge status: Home / Self Care | Payer: Medicare HMO | Source: Ambulatory Visit | Attending: Internal Medicine | Admitting: Internal Medicine

## 2018-11-28 ENCOUNTER — Other Ambulatory Visit: Payer: Self-pay

## 2018-11-28 ENCOUNTER — Encounter (HOSPITAL_COMMUNITY): Payer: Self-pay | Admitting: *Deleted

## 2018-11-28 ENCOUNTER — Encounter (HOSPITAL_COMMUNITY)
Admission: RE | Disposition: A | Discharge status: Home / Self Care | Payer: Self-pay | Source: Ambulatory Visit | Attending: Internal Medicine

## 2018-11-28 DIAGNOSIS — Z7984 Long term (current) use of oral hypoglycemic drugs: Secondary | ICD-10-CM | POA: Insufficient documentation

## 2018-11-28 DIAGNOSIS — D125 Benign neoplasm of sigmoid colon: Secondary | ICD-10-CM | POA: Diagnosis not present

## 2018-11-28 DIAGNOSIS — Z86010 Personal history of colonic polyps: Secondary | ICD-10-CM

## 2018-11-28 DIAGNOSIS — Z9049 Acquired absence of other specified parts of digestive tract: Secondary | ICD-10-CM | POA: Diagnosis not present

## 2018-11-28 DIAGNOSIS — Z1211 Encounter for screening for malignant neoplasm of colon: Secondary | ICD-10-CM | POA: Diagnosis not present

## 2018-11-28 DIAGNOSIS — I2510 Atherosclerotic heart disease of native coronary artery without angina pectoris: Secondary | ICD-10-CM | POA: Diagnosis not present

## 2018-11-28 DIAGNOSIS — D649 Anemia, unspecified: Secondary | ICD-10-CM | POA: Insufficient documentation

## 2018-11-28 DIAGNOSIS — M1990 Unspecified osteoarthritis, unspecified site: Secondary | ICD-10-CM | POA: Diagnosis not present

## 2018-11-28 DIAGNOSIS — G4733 Obstructive sleep apnea (adult) (pediatric): Secondary | ICD-10-CM | POA: Insufficient documentation

## 2018-11-28 DIAGNOSIS — Z791 Long term (current) use of non-steroidal anti-inflammatories (NSAID): Secondary | ICD-10-CM | POA: Insufficient documentation

## 2018-11-28 DIAGNOSIS — D126 Benign neoplasm of colon, unspecified: Secondary | ICD-10-CM | POA: Diagnosis not present

## 2018-11-28 DIAGNOSIS — J449 Chronic obstructive pulmonary disease, unspecified: Secondary | ICD-10-CM | POA: Diagnosis not present

## 2018-11-28 DIAGNOSIS — D122 Benign neoplasm of ascending colon: Secondary | ICD-10-CM

## 2018-11-28 DIAGNOSIS — Z79899 Other long term (current) drug therapy: Secondary | ICD-10-CM | POA: Insufficient documentation

## 2018-11-28 DIAGNOSIS — E119 Type 2 diabetes mellitus without complications: Secondary | ICD-10-CM | POA: Diagnosis not present

## 2018-11-28 DIAGNOSIS — F17200 Nicotine dependence, unspecified, uncomplicated: Secondary | ICD-10-CM | POA: Insufficient documentation

## 2018-11-28 DIAGNOSIS — I10 Essential (primary) hypertension: Secondary | ICD-10-CM | POA: Diagnosis not present

## 2018-11-28 DIAGNOSIS — K5730 Diverticulosis of large intestine without perforation or abscess without bleeding: Secondary | ICD-10-CM

## 2018-11-28 DIAGNOSIS — G8929 Other chronic pain: Secondary | ICD-10-CM | POA: Insufficient documentation

## 2018-11-28 DIAGNOSIS — Z96652 Presence of left artificial knee joint: Secondary | ICD-10-CM | POA: Diagnosis not present

## 2018-11-28 DIAGNOSIS — D127 Benign neoplasm of rectosigmoid junction: Secondary | ICD-10-CM | POA: Diagnosis not present

## 2018-11-28 DIAGNOSIS — K635 Polyp of colon: Secondary | ICD-10-CM | POA: Insufficient documentation

## 2018-11-28 DIAGNOSIS — Z85828 Personal history of other malignant neoplasm of skin: Secondary | ICD-10-CM | POA: Insufficient documentation

## 2018-11-28 DIAGNOSIS — M549 Dorsalgia, unspecified: Secondary | ICD-10-CM | POA: Diagnosis not present

## 2018-11-28 DIAGNOSIS — D120 Benign neoplasm of cecum: Secondary | ICD-10-CM | POA: Insufficient documentation

## 2018-11-28 DIAGNOSIS — E785 Hyperlipidemia, unspecified: Secondary | ICD-10-CM | POA: Diagnosis not present

## 2018-11-28 DIAGNOSIS — N400 Enlarged prostate without lower urinary tract symptoms: Secondary | ICD-10-CM | POA: Insufficient documentation

## 2018-11-28 HISTORY — PX: POLYPECTOMY: SHX5525

## 2018-11-28 HISTORY — PX: COLONOSCOPY WITH PROPOFOL: SHX5780

## 2018-11-28 LAB — GLUCOSE, CAPILLARY
Glucose-Capillary: 86 mg/dL (ref 70–99)
Glucose-Capillary: 75 mg/dL (ref 70–99)

## 2018-11-28 SURGERY — COLONOSCOPY WITH PROPOFOL
Anesthesia: Monitor Anesthesia Care

## 2018-11-28 MED ORDER — HYDROCODONE-ACETAMINOPHEN 7.5-325 MG PO TABS: 1.0000 | ORAL_TABLET | Freq: Once | ORAL | Status: DC | PRN

## 2018-11-28 MED ORDER — MEPERIDINE HCL 100 MG/ML IJ SOLN: 6.2500 mg | INTRAMUSCULAR | Status: DC | PRN

## 2018-11-28 MED ORDER — LACTATED RINGERS IV SOLN
INTRAVENOUS | Status: DC
  Administered 2018-11-28 (×2): via INTRAVENOUS

## 2018-11-28 MED ORDER — HYDROMORPHONE HCL 1 MG/ML IJ SOLN: 0.2500 mg | INTRAMUSCULAR | Status: DC | PRN

## 2018-11-28 MED ORDER — PROPOFOL 10 MG/ML IV BOLUS
INTRAVENOUS | Status: DC | PRN
  Administered 2018-11-28 (×2): 20 mg via INTRAVENOUS

## 2018-11-28 MED ORDER — CHLORHEXIDINE GLUCONATE CLOTH 2 % EX PADS: 6.0000 | MEDICATED_PAD | Freq: Once | CUTANEOUS | Status: DC

## 2018-11-28 MED ORDER — LACTATED RINGERS IV SOLN: INTRAVENOUS | Status: DC

## 2018-11-28 MED ORDER — PROPOFOL 10 MG/ML IV BOLUS
INTRAVENOUS | Status: AC
  Filled 2018-11-28: qty 40

## 2018-11-28 MED ORDER — PROMETHAZINE HCL 25 MG/ML IJ SOLN: 6.2500 mg | INTRAMUSCULAR | Status: DC | PRN

## 2018-11-28 MED ORDER — PROPOFOL 500 MG/50ML IV EMUL
INTRAVENOUS | Status: DC | PRN
  Administered 2018-11-28: 10:00:00 via INTRAVENOUS
  Administered 2018-11-28: 125 ug/kg/min via INTRAVENOUS

## 2018-11-28 NOTE — H&P (Signed)
@LOGO @   Primary Care Physician:  Jani Gravel, MD Primary Gastroenterologist:  Dr.   Pre-Procedure History & Physical: HPI:  Todd George is a 77 y.o. male here for surveillance colonoscopy.  History of colonic adenoma and recently positive fecal DNA testing.  Past Medical History:  Diagnosis Date  . Arthritis   . 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 BPH (benign prostatic hyperplasia)   . Cancer ([place])    skin cancer  . Carotid artery disease ([place])   . Cellulitis   . Chronic back pain   . Colon polyps 01/04/2016  . COPD (chronic obstructive pulmonary disease) ([place])    2ppd  . Diabetes mellitus without complication ([place])   . Duodenal erosion 01/02/2016  . Gastric erosion with bleeding 01/02/2016  . Hyperlipidemia   . Hypertension   . OSA on CPAP    not used machine in over 10 yrs    Past Surgical History:  Procedure Laterality Date  . CHOLECYSTECTOMY    . COLONOSCOPY N/A 01/03/2016   RMR: Active bleeding seen arising from the small bowel. Multiple cecal/ascending colon AVMs ablated. 1.5 cm carpet polyp in the ascending segment status post piecemeal snare polypectomy and tattooing. Innocent appearing colonic diverticulosis.  . COLONOSCOPY N/A 06/14/2016   Procedure: COLONOSCOPY;  Surgeon: Daneil Dolin, MD;  [facility];  Service: Endoscopy;  Laterality: N/A;  1200  . ESOPHAGOGASTRODUODENOSCOPY N/A 01/01/2016   RMR: Multiple gastric and duodenal erosions likely NSAID related.  Marland Kitchen GIVENS CAPSULE STUDY N/A 01/03/2016   Incomplete study, capsule never reach cecum. Scattered erosions and AVMs throughout the small bowel.  Marland Kitchen JOINT REPLACEMENT    . REPLACEMENT TOTAL KNEE Left   . SHOULDER ARTHROSCOPY WITH DISTAL CLAVICLE RESECTION Right 05/28/2015   Procedure: SHOULDER ARTHROSCOPY WITH DISTAL CLAVICLE RESECTION;  Surgeon: Melrose Nakayama, MD;  Location: [place];  Service: Orthopedics;  Laterality: Right;  . SHOULDER ARTHROSCOPY  WITH SUBACROMIAL DECOMPRESSION Right 05/28/2015   Procedure: SHOULDER ARTHROSCOPY WITH SUBACROMIAL DECOMPRESSION;  Surgeon: Melrose Nakayama, MD;  Location: [facility];  Service: Orthopedics;  Laterality: Right;  . THROAT SURGERY  1965    Prior to Admission medications   Medication Sig Start Date End Date Taking? Authorizing Provider  acetaminophen (TYLENOL) 500 MG tablet Take 500-1,000 mg by mouth every 6 (six) hours as needed for moderate pain or headache.   Yes [provider]  clotrimazole-betamethasone (LOTRISONE) cream Apply 1 application topically daily as needed (for jock itch).    Yes [provider]  Colloidal Oatmeal (GOLD BOND ECZEMA RELIEF) 2 % CREA Apply 1 application topically daily as needed (for eczema).   Yes [provider]  HYDROcodone-acetaminophen (NORCO) 7.5-325 MG tablet Take 1 tablet by mouth daily as needed for moderate pain.    Yes [provider]  hydrOXYzine (ATARAX/VISTARIL) 25 MG tablet Take 50 mg by mouth every 6 (six) hours as needed for itching.   Yes [provider]  lisinopril-hydrochlorothiazide (PRINZIDE,ZESTORETIC) 20-12.5 MG tablet Take 1 tablet by mouth daily.  05/16/17  Yes [provider]  losartan (COZAAR) 50 MG tablet Take 50 mg by mouth daily.  04/30/17  Yes [provider]  metFORMIN (GLUCOPHAGE) 500 MG tablet Take 500 mg by mouth daily.    Yes [provider]  metoprolol succinate (TOPROL-XL) 25 MG 24 hr tablet Take 25 mg by mouth daily.   Yes [provider]  Multiple Vitamins-Minerals (PRESERVISION AREDS 2) CAPS Take 1 capsule  by mouth 2 (two) times daily.    Yes [provider]  mupirocin ointment (BACTROBAN) 2 % Apply 1 application topically daily as needed (for infection).   Yes [provider]  oxybutynin (DITROPAN) 5 MG tablet Take 5 mg by mouth daily. 09/02/18  Yes [provider]  pregabalin (LYRICA) 50 MG capsule Take 50 mg by  mouth daily.   Yes [provider]  psyllium (METAMUCIL) 58.6 % powder Take 1 packet by mouth 3 (three) times daily.   Yes [provider]  simvastatin (ZOCOR) 20 MG tablet Take 20 mg by mouth daily.   Yes [provider]  tamsulosin (FLOMAX) 0.4 MG CAPS capsule Take 0.4 mg by mouth daily.    Yes [provider]  traMADol (ULTRAM) 50 MG tablet Take 50 mg by mouth daily as needed for moderate pain.   Yes [provider]    Allergies as of 10/15/2018 - Review Complete 10/15/2018  Allergen Reaction Noted  . Nsaids Other (See Comments) 06/12/2017    History reviewed. No pertinent family history.  Social History   Socioeconomic History  . Marital status: Divorced    Spouse name: Not on file  . Number of children: Not on file  . Years of education: Not on file  . Highest education level: Not on file  Occupational History  . Not on file  Social Needs  . Financial resource strain: Not on file  . Food insecurity:    Worry: Not on file    Inability: Not on file  . Transportation needs:    Medical: Not on file    Non-medical: Not on file  Tobacco Use  . Smoking status: Current Every Day Smoker    Packs/day: 2.00    Years: 64.00    Pack years: 128.00  . Smokeless tobacco: Never Used  Substance and Sexual Activity  . Alcohol use: Yes    Alcohol/week: 0.0 standard drinks    Comment: rare  . Drug use: No  . Sexual activity: Never  Lifestyle  . Physical activity:    Days per week: Not on file    Minutes per session: Not on file  . Stress: Not on file  Relationships  . Social connections:    Talks on phone: Not on file    Gets together: Not on file    Attends religious service: Not on file    Active member of club or organization: Not on file    Attends meetings of clubs or organizations: Not on file    Relationship status: Not on file  . Intimate partner violence:    Fear of current or ex partner: Not on file    Emotionally  abused: Not on file    Physically abused: Not on file    Forced sexual activity: Not on file  Other Topics Concern  . Not on file  Social History Narrative   Divorced   Lives alone    [name] here from [place] in Jan 2016   Retired - Agricultural consultant    Review of Systems: See HPI, otherwise negative ROS  Physical Exam: There were no vitals taken for this visit. General:   Alert,  Well-developed, well-nourished, pleasant and cooperative in NAD Skin:  Intact without significant lesions or rashes. Eyes:  Sclera clear, no icterus.   Conjunctiva pink. Ears:  Normal auditory acuity. Nose:  No deformity, discharge,  or lesions. Mouth:  No deformity or lesions. Neck:  Supple; no masses or thyromegaly. No  significant cervical adenopathy. Lungs:  Clear throughout to auscultation.   No wheezes, crackles, or rhonchi. No acute distress. Heart:  Regular rate and rhythm; no murmurs, clicks, rubs,  or gallops. Abdomen: Non-distended, normal bowel sounds.  Soft and nontender without appreciable mass or hepatosplenomegaly.  Pulses:  Normal pulses noted. Extremities:  Without clubbing or edema.  Impression/Plan: Pleasant 77 year old gentleman with history of multiple colonic adenomas positive fecal DNA testing recently. Here for a diagnostic colonoscopy. The risks, benefits, limitations, alternatives and imponderables have been reviewed with the patient. Questions have been answered. All parties are agreeable.      Notice: This dictation was prepared with Dragon dictation along with smaller phrase technology. Any transcriptional errors that result from this process are unintentional and may not be corrected upon review.

## 2018-11-28 NOTE — Op Note (Signed)
[facility] Patient Name: Todd George Procedure Date: 11/28/2018 9:05 AM MRN: [id number] Date of Birth: 01-06-1941 Attending MD: Norvel Richards , MD CSN: [id number] Age: 77 Admit Type: Outpatient Procedure:                Colonoscopy Indications:              High risk colon cancer surveillance: Personal                            history of colonic polyps Providers:                Norvel Richards, MD, Lurline Del, RN, Rosina Lowenstein, RN Referring MD:              Medicines:                Propofol per Anesthesia Complications:            No immediate complications. Estimated Blood Loss:     Estimated blood loss was minimal. Procedure:                Pre-Anesthesia Assessment:                           - Prior to the procedure, a History and Physical                            was performed, and patient medications and                            allergies were reviewed. The patient's tolerance of                            previous anesthesia was also reviewed. The risks                            and benefits of the procedure and the sedation                            options and risks were discussed with the patient.                            All questions were answered, and informed consent                            was obtained. Prior Anticoagulants: The patient has                            taken no previous anticoagulant or antiplatelet                            agents. ASA Grade Assessment: II - A patient with  mild systemic disease. After reviewing the risks                            and benefits, the patient was deemed in                            satisfactory condition to undergo the procedure.                           After obtaining informed consent, the colonoscope                            was passed under direct vision. Throughout the                            procedure, the patient's blood  pressure, pulse, and                            oxygen saturations were monitored continuously. The                            CF-[id number] ([id number]) scope was introduced through                            the and advanced to the the cecum, identified by                            appendiceal orifice and ileocecal valve. The                            colonoscopy was performed without difficulty. The                            patient tolerated the procedure well. The quality                            of the bowel preparation was adequate. Scope In: 9:27:23 AM Scope Out: 9:47:05 AM Scope Withdrawal Time: 0 hours 14 minutes 44 seconds  Total Procedure Duration: 0 hours 19 minutes 42 seconds  Findings:      The perianal and digital rectal examinations were normal.      Four sessile polyps were found in the ascending colon. The polyps were 4       to 6 mm in size. These polyps were removed with a cold snare. Resection       and retrieval were complete. Estimated blood loss was minimal.      A 4 mm polyp was found in the recto-sigmoid colon. The polyp was       semi-pedunculated. The polyp was removed with a cold snare. Resection       and retrieval were complete. Estimated blood loss was minimal.      Scattered medium-mouthed diverticula were found in the sigmoid colon and       descending colon. Patient also had scattered telangiectasias in the       cecum.      The exam was otherwise  without abnormality on direct and retroflexion       views. Impression:               - Four 4 to 6 mm polyps in the ascending colon,                            removed with a cold snare. Resected and retrieved.                           - One 4 mm polyp at the recto-sigmoid colon,                            removed with a cold snare. Resected and retrieved.                           - Diverticulosis in the sigmoid colon and in the                            descending colon. Cecal telangiectasias                            - The examination was otherwise normal on direct                            and retroflexion views. Moderate Sedation:      Moderate (conscious) sedation was personally administered by an       anesthesia professional. The following parameters were monitored: oxygen       saturation, heart rate, blood pressure, respiratory rate, EKG, adequacy       of pulmonary ventilation, and response to care. Recommendation:           - Patient has a contact number available for                            emergencies. The signs and symptoms of potential                            delayed complications were discussed with the                            patient. Return to normal activities tomorrow.                            Written discharge instructions were provided to the                            patient.                           - Resume previous diet.                           - Continue present medications.                           -  Await pathology results.                           - Repeat colonoscopy date to be determined after                            pending pathology results are reviewed for                            surveillance based on pathology results.                           - Return to GI office (date not yet determined). Procedure Code(s):        --- Professional ---                           [phone], Colonoscopy, flexible; with removal of                            tumor(s), polyp(s), or other lesion(s) by snare                            technique Diagnosis Code(s):        --- Professional ---                           Z86.010, Personal history of colonic polyps                           D12.2, Benign neoplasm of ascending colon                           D12.7, Benign neoplasm of rectosigmoid junction                           K57.30, Diverticulosis of large intestine without                            perforation or abscess without bleeding CPT copyright  2018 [employer]. All rights reserved. The codes documented in this report are preliminary and upon coder review may  be revised to meet current compliance requirements. Cristopher Estimable. Krista Som, MD Norvel Richards, MD 11/28/2018 9:55:16 AM This report has been signed electronically. Number of Addenda: 0

## 2018-11-28 NOTE — Progress Notes (Signed)
Patient had 2 cups of black coffee this morning between 0330 and 0630. Dr. Currie Paris and Dr. [name] notified. Will push patients procedure back to later this morning.

## 2018-11-28 NOTE — Discharge Instructions (Signed)
°Colonoscopy °Discharge Instructions ° °Read the instructions outlined below and refer to this sheet in the next few weeks. These discharge instructions provide you with general information on caring for yourself after you leave the hospital. Your doctor may also give you specific instructions. While your treatment has been planned according to the most current medical practices available, unavoidable complications occasionally occur. If you have any problems or questions after discharge, call Dr. Rourk at [phone]. °ACTIVITY °· You may resume your regular activity, but move at a slower pace for the next 24 hours.  °· Take frequent rest periods for the next 24 hours.  °· Walking will help get rid of the air and reduce the bloated feeling in your belly (abdomen).  °· No driving for 24 hours (because of the medicine (anesthesia) used during the test).   °· Do not sign any important legal documents or operate any machinery for 24 hours (because of the anesthesia used during the test).  °NUTRITION °· Drink plenty of fluids.  °· You may resume your normal diet as instructed by your doctor.  °· Begin with a light meal and progress to your normal diet. Heavy or fried foods are harder to digest and may make you feel sick to your stomach (nauseated).  °· Avoid alcoholic beverages for 24 hours or as instructed.  °MEDICATIONS °· You may resume your normal medications unless your doctor tells you otherwise.  °WHAT YOU CAN EXPECT TODAY °· Some feelings of bloating in the abdomen.  °· Passage of more gas than usual.  °· Spotting of blood in your stool or on the toilet paper.  °IF YOU HAD POLYPS REMOVED DURING THE COLONOSCOPY: °· No aspirin products for 7 days or as instructed.  °· No alcohol for 7 days or as instructed.  °· Eat a soft diet for the next 24 hours.  °FINDING OUT THE RESULTS OF YOUR TEST °Not all test results are available during your visit. If your test results are not back during the visit, make an appointment  with your caregiver to find out the results. Do not assume everything is normal if you have not heard from your caregiver or the medical facility. It is important for you to follow up on all of your test results.  °SEEK IMMEDIATE MEDICAL ATTENTION IF: °· You have more than a spotting of blood in your stool.  °· Your belly is swollen (abdominal distention).  °· You are nauseated or vomiting.  °· You have a temperature over 101.  °· You have abdominal pain or discomfort that is severe or gets worse throughout the day.  ° ° °Colon polyp and diverticulosis information provided ° °Further recommendations to follow pending review of pathology report ° ° °Colon Polyps °Polyps are tissue growths inside the body. Polyps can grow in many places, including the large intestine (colon). A polyp may be a round bump or a mushroom-shaped growth. You could have one polyp or several. °Most colon polyps are noncancerous (benign). However, some colon polyps can become cancerous over time. °What are the causes? °The exact cause of colon polyps is not known. °What increases the risk? °This condition is more likely to develop in people who: °· Have a family history of colon cancer or colon polyps. °· Are older than 50 or older than 45 if they are African American. °· Have inflammatory bowel disease, such as ulcerative colitis or Crohn disease. °· Are overweight. °· Smoke cigarettes. °· Do not get enough exercise. °· Drink too   much alcohol. °· Eat a diet that is: °? High in fat and red meat. °? Low in fiber. °· Had childhood cancer that was treated with abdominal radiation. ° °What are the signs or symptoms? °Most polyps do not cause symptoms. If you have symptoms, they may include: °· Blood coming from your rectum when having a bowel movement. °· Blood in your stool. The stool may look dark red or black. °· A change in bowel habits, such as constipation or diarrhea. ° °How is this diagnosed? °This condition is diagnosed with a  colonoscopy. This is a procedure that uses a lighted, flexible scope to look at the inside of your colon. °How is this treated? °Treatment for this condition involves removing any polyps that are found. Those polyps will then be tested for cancer. If cancer is found, your health care provider will talk to you about options for colon cancer treatment. °Follow these instructions at home: °Diet °· Eat plenty of fiber, such as fruits, vegetables, and whole grains. °· Eat foods that are high in calcium and vitamin D, such as milk, cheese, yogurt, eggs, liver, fish, and broccoli. °· Limit foods high in fat, red meats, and processed meats, such as hot dogs, sausage, bacon, and lunch meats. °· Maintain a healthy weight, or lose weight if recommended by your health care provider. °General instructions °· Do not smoke cigarettes. °· Do not drink alcohol excessively. °· Keep all follow-up visits as told by your health care provider. This is important. This includes keeping regularly scheduled colonoscopies. Talk to your health care provider about when you need a colonoscopy. °· Exercise every day or as told by your health care provider. °Contact a health care provider if: °· You have new or worsening bleeding during a bowel movement. °· You have new or increased blood in your stool. °· You have a change in bowel habits. °· You unexpectedly lose weight. °This information is not intended to replace advice given to you by your health care provider. Make sure you discuss any questions you have with your health care provider. °Document Released: 09/06/2004 Document Revised: 05/18/2016 Document Reviewed: 11/01/2015 °Elsevier Interactive Patient Education © 2018 [employer] ° ° °Diverticulosis °Diverticulosis is a condition that develops when small pouches (diverticula) form in the wall of the large intestine (colon). The colon is where water is absorbed and stool is formed. The pouches form when the inside layer of the colon  pushes through weak spots in the outer layers of the colon. You may have a few pouches or many of them. °What are the causes? °The cause of this condition is not known. °What increases the risk? °The following factors may make you more likely to develop this condition: °· Being older than age 60. Your risk for this condition increases with age. Diverticulosis is rare among people younger than age 30. By age 80, many people have it. °· Eating a low-fiber diet. °· Having frequent constipation. °· Being overweight. °· Not getting enough exercise. °· Smoking. °· Taking over-the-counter pain medicines, like aspirin and ibuprofen. °· Having a family history of diverticulosis. ° °What are the signs or symptoms? °In most people, there are no symptoms of this condition. If you do have symptoms, they may include: °· Bloating. °· Cramps in the abdomen. °· Constipation or diarrhea. °· Pain in the lower left side of the abdomen. ° °How is this diagnosed? °This condition is most often diagnosed during an exam for other colon problems. Because diverticulosis usually has no   symptoms, it often cannot be diagnosed independently. This condition may be diagnosed by: °· Using a flexible scope to examine the colon (colonoscopy). °· Taking an X-ray of the colon after dye has been put into the colon (barium enema). °· Doing a CT scan. ° °How is this treated? °You may not need treatment for this condition if you have never developed an infection related to diverticulosis. If you have had an infection before, treatment may include: °· Eating a high-fiber diet. This may include eating more fruits, vegetables, and grains. °· Taking a fiber supplement. °· Taking a live bacteria supplement (probiotic). °· Taking medicine to relax your colon. °· Taking antibiotic medicines. ° °Follow these instructions at home: °· Drink 6-8 glasses of water or more each day to prevent constipation. °· Try not to strain when you have a bowel movement. °· If you  have had an infection before: °? Eat more fiber as directed by your health care provider or your diet and nutrition specialist (dietitian). °? Take a fiber supplement or probiotic, if your health care provider approves. °· Take over-the-counter and prescription medicines only as told by your health care provider. °· If you were prescribed an antibiotic, take it as told by your health care provider. Do not stop taking the antibiotic even if you start to feel better. °· Keep all follow-up visits as told by your health care provider. This is important. °Contact a health care provider if: °· You have pain in your abdomen. °· You have bloating. °· You have cramps. °· You have not had a bowel movement in 3 days. °Get help right away if: °· Your pain gets worse. °· Your bloating becomes very bad. °· You have a fever or chills, and your symptoms suddenly get worse. °· You vomit. °· You have bowel movements that are bloody or black. °· You have bleeding from your rectum. °Summary °· Diverticulosis is a condition that develops when small pouches (diverticula) form in the wall of the large intestine (colon). °· You may have a few pouches or many of them. °· This condition is most often diagnosed during an exam for other colon problems. °· If you have had an infection related to diverticulosis, treatment may include increasing the fiber in your diet, taking supplements, or taking medicines. °This information is not intended to replace advice given to you by your health care provider. Make sure you discuss any questions you have with your health care provider. °Document Released: 09/07/2004 Document Revised: 10/30/2016 Document Reviewed: 10/30/2016 °Elsevier Interactive Patient Education © 2017 [employer] ° °

## 2018-11-28 NOTE — Anesthesia Preprocedure Evaluation (Addendum)
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

## 2018-11-28 NOTE — Anesthesia Postprocedure Evaluation (Signed)
Anesthesia Post Note  Patient: Todd George  Procedure(s) Performed: COLONOSCOPY WITH PROPOFOL (N/A ) POLYPECTOMY  Patient location during evaluation: PACU Anesthesia Type: MAC Level of consciousness: awake and alert and oriented Pain management: pain level controlled Vital Signs Assessment: post-procedure vital signs reviewed and stable Respiratory status: spontaneous breathing Cardiovascular status: blood pressure returned to baseline Postop Assessment: no apparent nausea or vomiting Anesthetic complications: no     Last Vitals:  Vitals:   11/28/18 0739 11/28/18 0835  BP: (!) 182/83 (!) 161/66  Pulse: 65   Resp: (!) 22   Temp: 36.7 C   SpO2: 95%     Last Pain:  Vitals:   11/28/18 0739  TempSrc: Oral  PainSc: 0-No pain                 Todd George

## 2018-11-28 NOTE — Transfer of Care (Signed)
Immediate Anesthesia Transfer of Care Note  Patient: Todd George  Procedure(s) Performed: COLONOSCOPY WITH PROPOFOL (N/A ) POLYPECTOMY  Patient Location: PACU  Anesthesia Type:MAC  Level of Consciousness: awake  Airway & Oxygen Therapy: Patient Spontanous Breathing  Post-op Assessment: Report given to RN  Post vital signs: Reviewed  Last Vitals:  Vitals Value Taken Time  BP 132/66 11/28/2018  9:57 AM  Temp    Pulse 58 11/28/2018 10:00 AM  Resp 22 11/28/2018 10:00 AM  SpO2 100 % 11/28/2018 10:00 AM  Vitals shown include unvalidated device data.  Last Pain:  Vitals:   11/28/18 0739  TempSrc: Oral  PainSc: 0-No pain      Patients Stated Pain Goal: 10 (73/22/02 [id number])  Complications: No apparent anesthesia complications

## 2018-12-01 ENCOUNTER — Encounter: Payer: Self-pay | Admitting: Internal Medicine

## 2018-12-04 ENCOUNTER — Encounter (HOSPITAL_COMMUNITY): Payer: Self-pay | Admitting: Internal Medicine

## 2018-12-10 DIAGNOSIS — I10 Essential (primary) hypertension: Secondary | ICD-10-CM | POA: Diagnosis not present

## 2018-12-10 DIAGNOSIS — E119 Type 2 diabetes mellitus without complications: Secondary | ICD-10-CM | POA: Diagnosis not present

## 2018-12-10 DIAGNOSIS — E785 Hyperlipidemia, unspecified: Secondary | ICD-10-CM | POA: Diagnosis not present

## 2018-12-11 ENCOUNTER — Encounter (HOSPITAL_COMMUNITY): Payer: Self-pay

## 2018-12-11 ENCOUNTER — Other Ambulatory Visit: Payer: Self-pay

## 2018-12-11 ENCOUNTER — Ambulatory Visit (HOSPITAL_COMMUNITY): Payer: Medicare HMO | Attending: Neurosurgery

## 2018-12-11 DIAGNOSIS — M542 Cervicalgia: Secondary | ICD-10-CM | POA: Insufficient documentation

## 2018-12-11 DIAGNOSIS — R293 Abnormal posture: Secondary | ICD-10-CM

## 2018-12-11 DIAGNOSIS — M6281 Muscle weakness (generalized): Secondary | ICD-10-CM | POA: Insufficient documentation

## 2018-12-11 DIAGNOSIS — M545 Low back pain, unspecified: Secondary | ICD-10-CM

## 2018-12-11 DIAGNOSIS — G8929 Other chronic pain: Secondary | ICD-10-CM | POA: Insufficient documentation

## 2018-12-11 NOTE — Therapy (Signed)
[place], [place], [address] Phone: [phone]   Fax:  [phone]  Physical Therapy Evaluation  Patient Details  Name: Todd George MRN: [id number] Date of Birth: 08/14/1941 Referring Provider (PT): Erline Levine, MD   Encounter Date: 12/11/2018  PT End of Session - 12/11/18 1612    Visit Number  1    Number of Visits  9    Date for PT Re-Evaluation  01/08/19    Authorization Type  [employer] ($40 co-pay, no auth required, no visit limit)    Authorization Time Period  12/11/18-01/10/19    Authorization - Visit Number  1    Authorization - Number of Visits  10    PT Start Time  1340    PT Stop Time  1430    PT Time Calculation (min)  50 min    Activity Tolerance  Patient tolerated treatment well    Behavior During Therapy  [facility] for tasks assessed/performed       Past Medical History:  Diagnosis Date  . Arthritis   . 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 BPH (benign prostatic hyperplasia)   . Cancer ([place])    skin cancer  . Carotid artery disease ([place])   . Cellulitis   . Chronic back pain   . Colon polyps 01/04/2016  . COPD (chronic obstructive pulmonary disease) ([place])    2ppd  . Diabetes mellitus without complication ([place])   . Duodenal erosion 01/02/2016  . Gastric erosion with bleeding 01/02/2016  . Hyperlipidemia   . Hypertension   . OSA on CPAP    not used machine in over 10 yrs    Past Surgical History:  Procedure Laterality Date  . CHOLECYSTECTOMY    . COLONOSCOPY N/A 01/03/2016   RMR: Active bleeding seen arising from the small bowel. Multiple cecal/ascending colon AVMs ablated. 1.5 cm carpet polyp in the ascending segment status post piecemeal snare polypectomy and tattooing. Innocent appearing colonic diverticulosis.  . COLONOSCOPY N/A 06/14/2016   Procedure: COLONOSCOPY;  Surgeon: Daneil Dolin, MD;  [facility];  Service: Endoscopy;   Laterality: N/A;  1200  . COLONOSCOPY WITH PROPOFOL N/A 11/28/2018   Procedure: COLONOSCOPY WITH PROPOFOL;  Surgeon: Daneil Dolin, MD;  [facility];  Service: Endoscopy;  Laterality: N/A;  8:30am  . ESOPHAGOGASTRODUODENOSCOPY N/A 01/01/2016   RMR: Multiple gastric and duodenal erosions likely NSAID related.  Marland Kitchen GIVENS CAPSULE STUDY N/A 01/03/2016   Incomplete study, capsule never reach cecum. Scattered erosions and AVMs throughout the small bowel.  Marland Kitchen JOINT REPLACEMENT    . POLYPECTOMY  11/28/2018   Procedure: POLYPECTOMY;  Surgeon: Daneil Dolin, MD;  [facility];  Service: Endoscopy;;  ascending colon  . REPLACEMENT TOTAL KNEE Left   . SHOULDER ARTHROSCOPY WITH DISTAL CLAVICLE RESECTION Right 05/28/2015   Procedure: SHOULDER ARTHROSCOPY WITH DISTAL CLAVICLE RESECTION;  Surgeon: Melrose Nakayama, MD;  Location: [place];  Service: Orthopedics;  Laterality: Right;  . SHOULDER ARTHROSCOPY WITH SUBACROMIAL DECOMPRESSION Right 05/28/2015   Procedure: SHOULDER ARTHROSCOPY WITH SUBACROMIAL DECOMPRESSION;  Surgeon: Melrose Nakayama, MD;  Location: [place];  Service: Orthopedics;  Laterality: Right;  . THROAT SURGERY  1965    There were no vitals filed for this visit.   Subjective Assessment - 12/11/18 1342    Subjective  Patient arrive reporting worsening back and neck pain over the last couple of years. He  denies any injury or incident that may have initiated back and neck pain. He states his back is worse than his neck and that he has pain when leaning forward, squatting, bending forward, and in flexed positions. He reports pain with sit to stand movements intermittently. He states "siting erect" is the only thing that helps with his pain and if he is sitting up straight he does not have any pain in his low back. He reports turning his head it typically what makes hi neck hurt. He denies changes in bowel/bladder function, night sweats, night pain, and  parasthesia in LE's.     Pertinent History  Lt TKA, Rt shoulder arthroscopy    Limitations  Lifting;Sitting;Standing;House hold activities;Walking    How long can you sit comfortably?  as long as I want if sitting errect    How long can you stand comfortably?  30 minutes or more    How long can you walk comfortably?  "untested", pt unsure of how long he could walk    Diagnostic tests  Moderate degenerative changes of the cervical spine. Large left paracentral disc protrusion at C5-C6. Severe neuroforaminal stenosis on the left at C4-C5 and bilaterally at C5-C6. Moderate neuroforaminal stenosis on the left at C3-C4 and bilaterally at C6-C7. Mild central spinal canal stenosis at C3-C4 and C6-C7.;     Patient Stated Goals  pain relief    Currently in Pain?  No/denies    Aggravating Factors   worst it gets is 9/10 - bending, stooping, leaning forward    Pain Relieving Factors  standigupright, leaning back, back supported         [facility] PT Assessment - 12/11/18 0001      Assessment   Medical Diagnosis  Low Back and Neck Pain    Referring Provider (PT)  Erline Levine, MD    Onset Date/Surgical Date  12/11/16   worsening over the last couple years   Next MD Visit  unsure    Prior Therapy  for Lt TKA      Precautions   Precautions  None      Restrictions   Weight Bearing Restrictions  No      Balance Screen   Has the patient fallen in the past 6 months  Yes    How many times?  1   fiddling with gait and fell over when squatting down   Has the patient had a decrease in activity level because of a fear of falling?   Yes    Is the patient reluctant to leave their home because of a fear of falling?   No      Home Environment   Living Environment  Private residence    Living Arrangements  Alone   2 dogs   Available Help at Discharge  Neighbor    Type of [place] to enter    Entrance Stairs-Number of Steps  3   steps too high for Rt knee, so put 2x 6" in  middle of stairs   Entrance Stairs-Rails  Can reach both    Home Layout  One level;Laundry or work area in basement   doesn't got to basement if can avoid it   [employer] - single point;Walker - 2 wheels;Grab bars - toilet;Grab bars - tub/shower   tilt table, tub shower combo   Additional Comments  Patient moved here 4 years ago, he has a good neighbor, Audrea Muscat, who  can help him if he needs it      Prior Function   Level of Independence  Independent    Vocation  Retired    Biomedical scientist  retired from [employer], taught music years ago as well    Leisure  enjoys playing on computer, play scrabble with a friend in [place], he walks about 0.25 miles every day with his dogs      Cognition   Overall Cognitive Status  Within Functional Limits for tasks assessed      Observation/Other Assessments   Focus on Therapeutic Outcomes (FOTO)   43% limited      Functional Tests   Functional tests  Squat      Squat   Comments  10 reps: decreased hip/knee flexion, excessive turnk flexion, decreased depth, NBOS, patient reported pain on 2nd rep and then denied pain at completion of all squats      Posture/Postural Control   Posture/Postural Control  Postural limitations    Postural Limitations  Rounded Shoulders;Forward head;Decreased lumbar lordosis    Posture Comments  elevated shoulders      ROM / Strength   AROM / PROM / Strength  AROM;Strength      AROM   AROM Assessment Site  Lumbar;Cervical    Cervical Flexion  70    Cervical Extension  35    Cervical - Right Side Bend  15    Cervical - Left Side Bend  35    Cervical - Right Rotation  50    Cervical - Left Rotation  60    Lumbar Flexion  60    Lumbar Extension  20    Lumbar - Right Side Bend  10    Lumbar - Left Side Bend  22    Lumbar - Right Rotation  8    Lumbar - Left Rotation  8      Strength   Strength Assessment Site  Hip;Knee;Ankle    Right Hip Flexion  4+/5    Right Hip Extension  3/5     Right Hip ABduction  4+/5    Left Hip Flexion  4+/5    Left Hip Extension  3+/5    Left Hip ABduction  4/5    Right/Left Knee  Right;Left    Right Knee Flexion  4/5    Right Knee Extension  4/5    Left Knee Flexion  4+/5    Left Knee Extension  5/5    Right Ankle Dorsiflexion  5/5    Left Ankle Dorsiflexion  5/5      Flexibility   Soft Tissue Assessment /Muscle Length  yes    Hamstrings  Rt LE = 90/135; Lt LE = 90/130      Palpation   Spinal mobility  Patient has normal mobility thorughout lumbar spine with exception of L5 being hypomobile,. He has pain with PA to L5 and mild pain with PA to L3. Mild tenderness to Rt unilateral PA to transverse process on L5.    Palpation comment  no tenderness along paraspinals.      Transfers   Five time sit to stand comments   26.2 without UE use        Objective measurements completed on examination: See above findings.    [facility] Adult PT Treatment/Exercise - 12/11/18 0001      Exercises   Exercises  Lumbar      Lumbar Exercises: Stretches   Standing Extension  10 reps;10 seconds;Limitations  Standing Extension Limitations  against counter       PT Education - 12/11/18 1616    Education Details  Educated on exam findings and on appropriate POC as well as how to manage scheduling to [facility] patient preferences. Educated on initial HEP.    Person(s) Educated  Patient    Methods  Explanation;Handout    Comprehension  Verbalized understanding;Returned demonstration       PT Short Term Goals - 12/11/18 1620      PT SHORT TERM GOAL #1   Title   Patient will be independent with HEP, updated PRN, to reduce pain and improve mobility to improve QOL throughout his daily activities.    Time  2    Period  Weeks    Status  New    Target Date  12/25/18      PT SHORT TERM GOAL #2   Title  Patient will demonstrate correct log roll technique to reduce strain on low back with supien to sit transfers.    Time  2    Period  Weeks     Status  New        PT Long Term Goals - 12/11/18 1622      PT LONG TERM GOAL #1   Title  Patient will improve limited cervical and lumbar ROM to WFL's to demonstrate improve joint mobility and flexibility, to reduce pain duirng funcitonal activities such as lifting groceries to put away, turnign head when driving, and walking dogs.     Time  4    Period  Weeks    Status  New    Target Date  01/08/19      PT LONG TERM GOAL #2   Title  Patient will improve bil LE strength by 1 grade for each limited muscle group to demonstrate significant improvement in muscle activation and functional strengrh.     Time  4    Period  Weeks    Status  New      PT LONG TERM GOAL #3   Title  Patient will improve 5x sit to stand testing by 6 seconds or more to demonstrate significant improvement in LE strength, and improved performance of transitional movements. He will deny pain with transitional movement.    Time  4    Period  Weeks    Status  New      PT LONG TERM GOAL #4   Title  Patient will improve FOTO to 38% or less to indicated reduce self reported limitation related to low back pain with daily activities.     Time  4    Period  Weeks    Status  New        Plan - 12/11/18 1613    Clinical Impression Statement  Mr. Piccione presents to physical therapy for evaluation of low back and neck pain. He reports his low back is worse than his neck and therefore session focused primarily on this. Mr. Pietrzak presents with limited cervical and lumbar ROM, and reports pain provocation in flexion posture and relief with lumbar extension. He has pain provocation with PA to L5 and L3, L5 are hypomobile compared to surrounds lumbar vertebrae. He denise radicular symptoms in LE's but does report paraesthesia into Rt UE. His pain is primarily midline in low back but he occasionally experiences pain in Rt posterior hip. He has weakness in Bil LE, Rt>Lt. He demonstrates impaired posture and rest, and improper body  mechanics with functional squats and  transfers. Mr. Paver will benefit from skilled PT interventions to address impairments to improve function and reduce pain with daily activities.     Clinical Presentation  Stable    Clinical Presentation due to:  weakness, abnormal posture , limited ROM, pain, impaired balance, FOTO    Clinical Decision Making  Moderate    Rehab Potential  Fair    PT Frequency  2x / week    PT Duration  4 weeks    PT Treatment/Interventions  ADLs/Self Care Home Management;Aquatic Therapy;Cryotherapy;Electrical Stimulation;Moist Heat;Traction;Functional mobility training;Therapeutic activities;Therapeutic exercise;Balance training;Neuromuscular re-education;Cognitive remediation;Patient/family education;Manual techniques;Passive range of motion    PT Next Visit Plan  Review eval and goals with patient. Perform PA's to lumbar spine, grade 1 if with PT, and initiate extension posture stretches. Begin postural strengthening and cervical ROM exercsies. Pt has reported pain relief with tilt table and may benefit from manual traction of lumbar and cervical spine.     PT Home Exercise Plan  Eval: lumbar extension at counter    Consulted and Agree with Plan of Care  Patient       Patient will benefit from skilled therapeutic intervention in order to improve the following deficits and impairments:  Improper body mechanics, Pain, Postural dysfunction, Decreased mobility, Hypomobility, Decreased strength, Decreased range of motion, Decreased activity tolerance, Impaired flexibility, Decreased balance  Visit Diagnosis: Chronic midline low back pain without sciatica  Cervicalgia  Abnormal posture  Muscle weakness (generalized)     Problem List Patient Active Problem List   Diagnosis Date Noted  . Diverticulosis of colon without hemorrhage   . Solitary pulmonary nodule 02/10/2016  . Abnormal CT scan 02/02/2016  . Chronic GI bleeding 02/01/2016  . Anemia due to chronic blood  loss 02/01/2016  . Colon polyps 01/04/2016  . 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 Gastric erosion with bleeding 01/02/2016  . Duodenal erosion 01/02/2016  . Tobacco abuse 01/01/2016  . Essential hypertension 01/01/2016  . Mucosal abnormality of stomach   . GI bleed 12/31/2015  . Diabetes mellitus without complication ([place])   . Hyperlipidemia   . BPH (benign prostatic hyperplasia)   . Carotid artery disease ([place])   . OSA on CPAP   . COPD (chronic obstructive pulmonary disease) ([place])     Kipp Brood, PT, DPT Physical Therapist with [facility]  12/11/2018 4:27 PM    [place], [place], [address] Phone: [phone]   Fax:  [phone]  Name: DAXTYN ROTTENBERG MRN: [id number] Date of Birth: September 04, 1941

## 2018-12-13 ENCOUNTER — Encounter (HOSPITAL_COMMUNITY): Payer: Self-pay

## 2018-12-13 ENCOUNTER — Ambulatory Visit (HOSPITAL_COMMUNITY): Payer: Medicare HMO

## 2018-12-13 DIAGNOSIS — M545 Low back pain, unspecified: Secondary | ICD-10-CM

## 2018-12-13 DIAGNOSIS — M6281 Muscle weakness (generalized): Secondary | ICD-10-CM | POA: Diagnosis not present

## 2018-12-13 DIAGNOSIS — R293 Abnormal posture: Secondary | ICD-10-CM | POA: Diagnosis not present

## 2018-12-13 DIAGNOSIS — G8929 Other chronic pain: Secondary | ICD-10-CM

## 2018-12-13 DIAGNOSIS — M542 Cervicalgia: Secondary | ICD-10-CM | POA: Diagnosis not present

## 2018-12-13 NOTE — Therapy (Signed)
[facility], [place], [address] Phone: [phone]   Fax:  [phone]  Physical Therapy Treatment  Patient Details  Name: Todd George MRN: [id number] Date of Birth: [birth year] Referring Provider (PT): Erline Levine, MD   Encounter Date: 12/13/2018  PT End of Session - 12/13/18 0944    Visit Number  2    Number of Visits  9    Date for PT Re-Evaluation  01/08/19    Authorization Type  [employer] H[employer] ($40 co-pay, no auth required, no visit limit)    Authorization Time Period  12/11/18-01/10/19    Authorization - Visit Number  2    Authorization - Number of Visits  10    PT Start Time  0945    PT Stop Time  1025    PT Time Calculation (min)  40 min    Activity Tolerance  Patient tolerated treatment well    Behavior During Therapy  [facility] for tasks assessed/performed       Past Medical History:  Diagnosis Date  . Arthritis   . 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 BPH (benign prostatic hyperplasia)   . Cancer ([place])    skin cancer  . Carotid artery disease ([place])   . Cellulitis   . Chronic back pain   . Colon polyps 01/04/2016  . COPD (chronic obstructive pulmonary disease) ([place])    2ppd  . Diabetes mellitus without complication ([place])   . Duodenal erosion 01/02/2016  . Gastric erosion with bleeding 01/02/2016  . Hyperlipidemia   . Hypertension   . OSA on CPAP    not used machine in over 10 yrs    Past Surgical History:  Procedure Laterality Date  . CHOLECYSTECTOMY    . COLONOSCOPY N/A 01/03/2016   RMR: Active bleeding seen arising from the small bowel. Multiple cecal/ascending colon AVMs ablated. 1.5 cm carpet polyp in the ascending segment status post piecemeal snare polypectomy and tattooing. Innocent appearing colonic diverticulosis.  . COLONOSCOPY N/A 06/14/2016   Procedure: COLONOSCOPY;  Surgeon: Daneil Dolin, MD;  [facility];  Service: Endoscopy;   Laterality: N/A;  1200  . COLONOSCOPY WITH PROPOFOL N/A 11/28/2018   Procedure: COLONOSCOPY WITH PROPOFOL;  Surgeon: Daneil Dolin, MD;  [facility];  Service: Endoscopy;  Laterality: N/A;  8:30am  . ESOPHAGOGASTRODUODENOSCOPY N/A 01/01/2016   RMR: Multiple gastric and duodenal erosions likely NSAID related.  Marland Kitchen GIVENS CAPSULE STUDY N/A 01/03/2016   Incomplete study, capsule never reach cecum. Scattered erosions and AVMs throughout the small bowel.  Marland Kitchen JOINT REPLACEMENT    . POLYPECTOMY  11/28/2018   Procedure: POLYPECTOMY;  Surgeon: Daneil Dolin, MD;  [facility];  Service: Endoscopy;;  ascending colon  . REPLACEMENT TOTAL KNEE Left   . SHOULDER ARTHROSCOPY WITH DISTAL CLAVICLE RESECTION Right 05/28/2015   Procedure: SHOULDER ARTHROSCOPY WITH DISTAL CLAVICLE RESECTION;  Surgeon: Melrose Nakayama, MD;  Location: [place];  Service: Orthopedics;  Laterality: Right;  . SHOULDER ARTHROSCOPY WITH SUBACROMIAL DECOMPRESSION Right 05/28/2015   Procedure: SHOULDER ARTHROSCOPY WITH SUBACROMIAL DECOMPRESSION;  Surgeon: Melrose Nakayama, MD;  Location: [place];  Service: Orthopedics;  Laterality: Right;  . THROAT SURGERY  1965    There were no vitals filed for this visit.  Subjective Assessment - 12/13/18 0944    Subjective  Pt states that it is too early to tell how his pain is doing. He's sore  but not painful right now.     Pertinent History  Lt TKA, Rt shoulder arthroscopy    Limitations  Lifting;Sitting;Standing;House hold activities;Walking    How long can you sit comfortably?  as long as I want if sitting errect    How long can you stand comfortably?  30 minutes or more    How long can you walk comfortably?  "untested", pt unsure of how long he could walk    Diagnostic tests  Moderate degenerative changes of the cervical spine. Large left paracentral disc protrusion at C5-C6. Severe neuroforaminal stenosis on the left at C4-C5 and bilaterally at  C5-C6. Moderate neuroforaminal stenosis on the left at C3-C4 and bilaterally at C6-C7. Mild central spinal canal stenosis at C3-C4 and C6-C7.;     Patient Stated Goals  pain relief    Currently in Pain?  No/denies            [facility] Adult PT Treatment/Exercise - 12/13/18 0001      Exercises   Exercises  Lumbar;Neck      Neck Exercises: Seated   Neck Retraction  15 reps    Neck Retraction Limitations  cues for form and posturing      Lumbar Exercises: Stretches   Active Hamstring Stretch  Right;Left    Active Hamstring Stretch Limitations  10x10" holds, supine, hands behind thigh    Prone on Elbows Stretch Limitations  x4 mins    Press Ups  10 reps;5 seconds      Lumbar Exercises: Seated   Other Seated Lumbar Exercises  3D thoracic excursions 10x3" holds      Lumbar Exercises: Supine   Bridge  10 reps    Bridge Limitations  2 sets      Lumbar Exercises: Sidelying   Clam  Both;20 reps    Clam Limitations  add resistance next session      Manual Therapy   Manual Therapy  Joint mobilization    Manual therapy comments  separate rest of treatment    Joint Mobilization  Grade II-III CPAs to C2-T1, L1-L5, to reduce pain and improve mobility      Neck Exercises: Stretches   Upper Trapezius Stretch Limitations  did not feel stretch when attempting this    Levator Stretch  Right;Left;1 rep;30 seconds             PT Education - 12/13/18 0944    Education Details  reviewed goals, exercise technique, continue HEP    Person(s) Educated  Patient    Methods  Explanation;Demonstration    Comprehension  Verbalized understanding;Returned demonstration       PT Short Term Goals - 12/11/18 1620      PT SHORT TERM GOAL #1   Title   Patient will be independent with HEP, updated PRN, to reduce pain and improve mobility to improve QOL throughout his daily activities.    Time  2    Period  Weeks    Status  New    Target Date  12/25/18      PT SHORT TERM GOAL #2   Title   Patient will demonstrate correct log roll technique to reduce strain on low back with supien to sit transfers.    Time  2    Period  Weeks    Status  New        PT Long Term Goals - 12/11/18 1622      PT LONG TERM GOAL #1   Title  Patient will improve limited  cervical and lumbar ROM to WFL's to demonstrate improve joint mobility and flexibility, to reduce pain duirng funcitonal activities such as lifting groceries to put away, turnign head when driving, and walking dogs.     Time  4    Period  Weeks    Status  New    Target Date  01/08/19      PT LONG TERM GOAL #2   Title  Patient will improve bil LE strength by 1 grade for each limited muscle group to demonstrate significant improvement in muscle activation and functional strengrh.     Time  4    Period  Weeks    Status  New      PT LONG TERM GOAL #3   Title  Patient will improve 5x sit to stand testing by 6 seconds or more to demonstrate significant improvement in LE strength, and improved performance of transitional movements. He will deny pain with transitional movement.    Time  4    Period  Weeks    Status  New      PT LONG TERM GOAL #4   Title  Patient will improve FOTO to 38% or less to indicated reduce self reported limitation related to low back pain with daily activities.     Time  4    Period  Weeks    Status  New            Plan - 12/13/18 1027    Clinical Impression Statement  Began session by reviewing goals with no f/u questions. Initiated POC this date with general flexibility, ROM activities, functional strengthening and manual. Had pt perform POE and prone press-ups for extension preference; added POE to HEP. Began bridging and clams for hip strength, 3D thoracic excursions for thoracic mobility, and stretching for cervical spine. Ended with manual joint mobs for cervical and lumbar spine; tenderness at C7 and L5 mostly. Continue as planned, progressing as able.     Rehab Potential  Fair    PT Frequency   2x / week    PT Duration  4 weeks    PT Treatment/Interventions  ADLs/Self Care Home Management;Aquatic Therapy;Cryotherapy;Electrical Stimulation;Moist Heat;Traction;Functional mobility training;Therapeutic activities;Therapeutic exercise;Balance training;Neuromuscular re-education;Cognitive remediation;Patient/family education;Manual techniques;Passive range of motion    PT Next Visit Plan  Perform PA's to lumbar spine, grade 1 if with PT, and initiate extension posture stretches. Begin postural strengthening and cervical ROM exercsies. Pt has reported pain relief with tilt table and may benefit from manual traction of lumbar and cervical spine.     PT Home Exercise Plan  Eval: lumbar extension at counter; 12/20: POE    Consulted and Agree with Plan of Care  Patient       Patient will benefit from skilled therapeutic intervention in order to improve the following deficits and impairments:  Improper body mechanics, Pain, Postural dysfunction, Decreased mobility, Hypomobility, Decreased strength, Decreased range of motion, Decreased activity tolerance, Impaired flexibility, Decreased balance  Visit Diagnosis: Chronic midline low back pain without sciatica  Cervicalgia  Abnormal posture  Muscle weakness (generalized)     Problem List Patient Active Problem List   Diagnosis Date Noted  . Diverticulosis of colon without hemorrhage   . Solitary pulmonary nodule 02/10/2016  . Abnormal CT scan 02/02/2016  . Chronic GI bleeding 02/01/2016  . Anemia due to chronic blood loss 02/01/2016  . Colon polyps 01/04/2016  . 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 Gastric erosion with bleeding 01/02/2016  .  Duodenal erosion 01/02/2016  . Tobacco abuse 01/01/2016  . Essential hypertension 01/01/2016  . Mucosal abnormality of stomach   . GI bleed 12/31/2015  . Diabetes mellitus without complication ([place])   . Hyperlipidemia   . BPH (benign prostatic hyperplasia)    . Carotid artery disease ([place])   . OSA on CPAP   . COPD (chronic obstructive pulmonary disease) ([place])        Geraldine Solar PT, [place], [place], [address] Phone: [phone]   Fax:  [phone]  Name: Todd George MRN: [id number] Date of Birth: 01/30/1941

## 2018-12-19 ENCOUNTER — Ambulatory Visit (HOSPITAL_COMMUNITY): Payer: Medicare HMO

## 2018-12-19 ENCOUNTER — Encounter (HOSPITAL_COMMUNITY): Payer: Self-pay

## 2018-12-19 DIAGNOSIS — M542 Cervicalgia: Secondary | ICD-10-CM | POA: Diagnosis not present

## 2018-12-19 DIAGNOSIS — M545 Low back pain, unspecified: Secondary | ICD-10-CM

## 2018-12-19 DIAGNOSIS — M6281 Muscle weakness (generalized): Secondary | ICD-10-CM

## 2018-12-19 DIAGNOSIS — R293 Abnormal posture: Secondary | ICD-10-CM | POA: Diagnosis not present

## 2018-12-19 DIAGNOSIS — G8929 Other chronic pain: Secondary | ICD-10-CM | POA: Diagnosis not present

## 2018-12-19 NOTE — Patient Instructions (Signed)
Hamstring Step 3    Left leg in maximal straight leg raise, heel at maximal stretch, straighten knee further by tightening knee cap. Warning: Intense stretch. Stay within tolerance. Hold 30 seconds.  Repeat 3 times.  Copyright  VHI. All rights reserved.

## 2018-12-19 NOTE — Therapy (Signed)
[facility] [place], [place], [address] Phone: [phone]   Fax:  [phone]  Physical Therapy Treatment  Patient Details  Name: Todd George MRN: [id number] Date of Birth: 07/20/1941 Referring Provider (PT): Erline Levine, MD   Encounter Date: 12/19/2018  PT End of Session - 12/19/18 1134    Visit Number  3    Number of Visits  9    Date for PT Re-Evaluation  01/08/19    Authorization Type  [employer] HMO ($40 co-pay, no auth required, no visit limit)    Authorization Time Period  12/11/18-01/10/19    Authorization - Visit Number  3    Authorization - Number of Visits  10    PT Start Time  1122    PT Stop Time  1205    PT Time Calculation (min)  43 min    Activity Tolerance  Patient tolerated treatment well    Behavior During Therapy  [facility] for tasks assessed/performed       Past Medical History:  Diagnosis Date  . Arthritis   . 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 BPH (benign prostatic hyperplasia)   . Cancer ([place])    skin cancer  . Carotid artery disease ([place])   . Cellulitis   . Chronic back pain   . Colon polyps 01/04/2016  . COPD (chronic obstructive pulmonary disease) ([place])    2ppd  . Diabetes mellitus without complication ([place])   . Duodenal erosion 01/02/2016  . Gastric erosion with bleeding 01/02/2016  . Hyperlipidemia   . Hypertension   . OSA on CPAP    not used machine in over 10 yrs    Past Surgical History:  Procedure Laterality Date  . CHOLECYSTECTOMY    . COLONOSCOPY N/A 01/03/2016   RMR: Active bleeding seen arising from the small bowel. Multiple cecal/ascending colon AVMs ablated. 1.5 cm carpet polyp in the ascending segment status post piecemeal snare polypectomy and tattooing. Innocent appearing colonic diverticulosis.  . COLONOSCOPY N/A 06/14/2016   Procedure: COLONOSCOPY;  Surgeon: Daneil Dolin, MD;  [facility];  Service: Endoscopy;   Laterality: N/A;  1200  . COLONOSCOPY WITH PROPOFOL N/A 11/28/2018   Procedure: COLONOSCOPY WITH PROPOFOL;  Surgeon: Daneil Dolin, MD;  [facility];  Service: Endoscopy;  Laterality: N/A;  8:30am  . ESOPHAGOGASTRODUODENOSCOPY N/A 01/01/2016   RMR: Multiple gastric and duodenal erosions likely NSAID related.  Marland Kitchen GIVENS CAPSULE STUDY N/A 01/03/2016   Incomplete study, capsule never reach cecum. Scattered erosions and AVMs throughout the small bowel.  Marland Kitchen JOINT REPLACEMENT    . POLYPECTOMY  11/28/2018   Procedure: POLYPECTOMY;  Surgeon: Daneil Dolin, MD;  [facility];  Service: Endoscopy;;  ascending colon  . REPLACEMENT TOTAL KNEE Left   . SHOULDER ARTHROSCOPY WITH DISTAL CLAVICLE RESECTION Right 05/28/2015   Procedure: SHOULDER ARTHROSCOPY WITH DISTAL CLAVICLE RESECTION;  Surgeon: Melrose Nakayama, MD;  Location: [place];  Service: Orthopedics;  Laterality: Right;  . SHOULDER ARTHROSCOPY WITH SUBACROMIAL DECOMPRESSION Right 05/28/2015   Procedure: SHOULDER ARTHROSCOPY WITH SUBACROMIAL DECOMPRESSION;  Surgeon: Melrose Nakayama, MD;  Location: [place];  Service: Orthopedics;  Laterality: Right;  . THROAT SURGERY  1965    There were no vitals filed for this visit.  Subjective Assessment - 12/19/18 1125    Subjective  Pt stated he slid and took a sit while walking down stairs carrying gifts for Christmas  yesterday, reports immediate R buttock wiht fall and generalized pain this morning.  No reports of current pain, did take pain meds prior tx.      Pertinent History  Lt TKA, Rt shoulder arthroscopy    Limitations  Lifting;Sitting;Standing;House hold activities;Walking    Patient Stated Goals  pain relief    Currently in Pain?  No/denies                       [facility] Adult PT Treatment/Exercise - 12/19/18 0001      Lumbar Exercises: Stretches   Active Hamstring Stretch  Right;Left;3 reps;30 seconds    Active Hamstring Stretch  Limitations  2nd/3rd set with rope    Prone on Elbows Stretch Limitations  2 min    Press Ups  10 reps;5 seconds      Lumbar Exercises: Standing   Other Standing Lumbar Exercises  chin tuck/wall arch wiht cueing to reduce lumbar extension wiht task 10x      Lumbar Exercises: Seated   Other Seated Lumbar Exercises  3D cervical excursion    Other Seated Lumbar Exercises  3D thoracic excursions 10x3" holds      Lumbar Exercises: Supine   Bridge  10 reps    Bridge Limitations  2 sets    Other Supine Lumbar Exercises  cervical retraction 10x 5" holds      Lumbar Exercises: Sidelying   Clam  Both;10 reps;5 seconds    Clam Limitations  RTB               PT Short Term Goals - 12/11/18 1620      PT SHORT TERM GOAL #1   Title   Patient will be independent with HEP, updated PRN, to reduce pain and improve mobility to improve QOL throughout his daily activities.    Time  2    Period  Weeks    Status  New    Target Date  12/25/18      PT SHORT TERM GOAL #2   Title  Patient will demonstrate correct log roll technique to reduce strain on low back with supien to sit transfers.    Time  2    Period  Weeks    Status  New        PT Long Term Goals - 12/11/18 1622      PT LONG TERM GOAL #1   Title  Patient will improve limited cervical and lumbar ROM to WFL's to demonstrate improve joint mobility and flexibility, to reduce pain duirng funcitonal activities such as lifting groceries to put away, turnign head when driving, and walking dogs.     Time  4    Period  Weeks    Status  New    Target Date  01/08/19      PT LONG TERM GOAL #2   Title  Patient will improve bil LE strength by 1 grade for each limited muscle group to demonstrate significant improvement in muscle activation and functional strengrh.     Time  4    Period  Weeks    Status  New      PT LONG TERM GOAL #3   Title  Patient will improve 5x sit to stand testing by 6 seconds or more to demonstrate significant  improvement in LE strength, and improved performance of transitional movements. He will deny pain with transitional movement.    Time  4    Period  Weeks    Status  New      PT LONG TERM GOAL #4   Title  Patient will improve FOTO to 38% or less to indicated reduce self reported limitation related to low back pain with daily activities.     Time  4    Period  Weeks    Status  New            Plan - 12/19/18 1211    Clinical Impression Statement  Continued session focus with established POC for spinal mobility and LE flexibility, gluteal strengthening and additional postural strengthening exercises.  Continued with extension based exercises, pt reports he continues with standing lumbar extension exercises regularly and has completed POE 1x at home.  Added cervical retraction, continued cervical and thoracic exersions and added wall arches for posture strengthening.  Cueing to reduce lumbar extension and proper chin tucks as well as to breath with new exercise.  No reoprts of pain through session.      Rehab Potential  Fair    PT Frequency  2x / week    PT Duration  4 weeks    PT Treatment/Interventions  ADLs/Self Care Home Management;Aquatic Therapy;Cryotherapy;Electrical Stimulation;Moist Heat;Traction;Functional mobility training;Therapeutic activities;Therapeutic exercise;Balance training;Neuromuscular re-education;Cognitive remediation;Patient/family education;Manual techniques;Passive range of motion    PT Next Visit Plan  Perform PA's to lumbar spine, grade 1 if with PT, and initiate extension posture stretches. Begin postural strengthening and cervical ROM exercsies. Pt has reported pain relief with tilt table and may benefit from manual traction of lumbar and cervical spine.     PT Home Exercise Plan  Eval: lumbar extension at counter; 12/20: POE       Patient will benefit from skilled therapeutic intervention in order to improve the following deficits and impairments:  Improper  body mechanics, Pain, Postural dysfunction, Decreased mobility, Hypomobility, Decreased strength, Decreased range of motion, Decreased activity tolerance, Impaired flexibility, Decreased balance  Visit Diagnosis: Chronic midline low back pain without sciatica  Cervicalgia  Abnormal posture  Muscle weakness (generalized)     Problem List Patient Active Problem List   Diagnosis Date Noted  . Diverticulosis of colon without hemorrhage   . Solitary pulmonary nodule 02/10/2016  . Abnormal CT scan 02/02/2016  . Chronic GI bleeding 02/01/2016  . Anemia due to chronic blood loss 02/01/2016  . Colon polyps 01/04/2016  . 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 Gastric erosion with bleeding 01/02/2016  . Duodenal erosion 01/02/2016  . Tobacco abuse 01/01/2016  . Essential hypertension 01/01/2016  . Mucosal abnormality of stomach   . GI bleed 12/31/2015  . Diabetes mellitus without complication ([place])   . Hyperlipidemia   . BPH (benign prostatic hyperplasia)   . Carotid artery disease ([place])   . OSA on CPAP   . COPD (chronic obstructive pulmonary disease) [facility])    Ihor [place], LPTA; CBIS [phone]  Aldona Lento 12/19/2018, 12:16 PM  [place], [place], [address] Phone: [phone]   Fax:  [phone]  Name: DUPREE GIVLER MRN: [id number] Date of Birth: 1941-08-22

## 2018-12-20 ENCOUNTER — Encounter

## 2018-12-23 ENCOUNTER — Ambulatory Visit (HOSPITAL_COMMUNITY): Payer: Medicare HMO

## 2018-12-23 ENCOUNTER — Encounter (HOSPITAL_COMMUNITY): Payer: Self-pay

## 2018-12-23 DIAGNOSIS — G8929 Other chronic pain: Secondary | ICD-10-CM | POA: Diagnosis not present

## 2018-12-23 DIAGNOSIS — H35371 Puckering of macula, right eye: Secondary | ICD-10-CM | POA: Diagnosis not present

## 2018-12-23 DIAGNOSIS — M545 Low back pain, unspecified: Secondary | ICD-10-CM

## 2018-12-23 DIAGNOSIS — H35423 Microcystoid degeneration of retina, bilateral: Secondary | ICD-10-CM | POA: Diagnosis not present

## 2018-12-23 DIAGNOSIS — M542 Cervicalgia: Secondary | ICD-10-CM | POA: Diagnosis not present

## 2018-12-23 DIAGNOSIS — R293 Abnormal posture: Secondary | ICD-10-CM | POA: Diagnosis not present

## 2018-12-23 DIAGNOSIS — M6281 Muscle weakness (generalized): Secondary | ICD-10-CM

## 2018-12-23 DIAGNOSIS — H353211 Exudative age-related macular degeneration, right eye, with active choroidal neovascularization: Secondary | ICD-10-CM | POA: Diagnosis not present

## 2018-12-23 DIAGNOSIS — H353124 Nonexudative age-related macular degeneration, left eye, advanced atrophic with subfoveal involvement: Secondary | ICD-10-CM | POA: Diagnosis not present

## 2018-12-23 NOTE — Therapy (Signed)
[place], [place], [address] Phone: [phone]   Fax:  [phone]  Physical Therapy Treatment  Patient Details  Name: Todd George MRN: [id number] Date of Birth: 12-30-1940 Referring Provider (PT): Erline Levine, MD   Encounter Date: 12/23/2018  PT End of Session - 12/23/18 1301    Visit Number  4    Number of Visits  9    Date for PT Re-Evaluation  01/08/19    Authorization Type  [employer] H[employer] ($40 co-pay, no auth required, no visit limit)    Authorization Time Period  12/11/18-01/10/19    Authorization - Visit Number  4    Authorization - Number of Visits  10    PT Start Time  1300    PT Stop Time  1330    PT Time Calculation (min)  30 min    Activity Tolerance  Patient tolerated treatment well    Behavior During Therapy  [facility] for tasks assessed/performed       Past Medical History:  Diagnosis Date  . Arthritis   . 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 BPH (benign prostatic hyperplasia)   . Cancer ([place])    skin cancer  . Carotid artery disease ([place])   . Cellulitis   . Chronic back pain   . Colon polyps 01/04/2016  . COPD (chronic obstructive pulmonary disease) ([place])    2ppd  . Diabetes mellitus without complication ([place])   . Duodenal erosion 01/02/2016  . Gastric erosion with bleeding 01/02/2016  . Hyperlipidemia   . Hypertension   . OSA on CPAP    not used machine in over 10 yrs    Past Surgical History:  Procedure Laterality Date  . CHOLECYSTECTOMY    . COLONOSCOPY N/A 01/03/2016   RMR: Active bleeding seen arising from the small bowel. Multiple cecal/ascending colon AVMs ablated. 1.5 cm carpet polyp in the ascending segment status post piecemeal snare polypectomy and tattooing. Innocent appearing colonic diverticulosis.  . COLONOSCOPY N/A 06/14/2016   Procedure: COLONOSCOPY;  Surgeon: Daneil Dolin, MD;  [facility];  Service: Endoscopy;   Laterality: N/A;  1200  . COLONOSCOPY WITH PROPOFOL N/A 11/28/2018   Procedure: COLONOSCOPY WITH PROPOFOL;  Surgeon: Daneil Dolin, MD;  [facility];  Service: Endoscopy;  Laterality: N/A;  8:30am  . ESOPHAGOGASTRODUODENOSCOPY N/A 01/01/2016   RMR: Multiple gastric and duodenal erosions likely NSAID related.  Marland Kitchen GIVENS CAPSULE STUDY N/A 01/03/2016   Incomplete study, capsule never reach cecum. Scattered erosions and AVMs throughout the small bowel.  Marland Kitchen JOINT REPLACEMENT    . POLYPECTOMY  11/28/2018   Procedure: POLYPECTOMY;  Surgeon: Daneil Dolin, MD;  [facility];  Service: Endoscopy;;  ascending colon  . REPLACEMENT TOTAL KNEE Left   . SHOULDER ARTHROSCOPY WITH DISTAL CLAVICLE RESECTION Right 05/28/2015   Procedure: SHOULDER ARTHROSCOPY WITH DISTAL CLAVICLE RESECTION;  Surgeon: Melrose Nakayama, MD;  Location: [facility];  Service: Orthopedics;  Laterality: Right;  . SHOULDER ARTHROSCOPY WITH SUBACROMIAL DECOMPRESSION Right 05/28/2015   Procedure: SHOULDER ARTHROSCOPY WITH SUBACROMIAL DECOMPRESSION;  Surgeon: Melrose Nakayama, MD;  Location: [place];  Service: Orthopedics;  Laterality: Right;  . THROAT SURGERY  1965    There were no vitals filed for this visit.  Subjective Assessment - 12/23/18 1301    Subjective  Pt states he is still sore from his "sit down" on the steps so he hasn't  done his HEP. But, he states that he has been really working on his posture.     Pertinent History  Lt TKA, Rt shoulder arthroscopy    Limitations  Lifting;Sitting;Standing;House hold activities;Walking    Patient Stated Goals  pain relief    Currently in Pain?  Yes    Pain Score  2     Pain Location  Back   neck 1/10   Pain Orientation  Lower    Pain Descriptors / Indicators  Aching;Dull    Pain Type  Chronic pain    Pain Onset  More than a month ago    Pain Frequency  Intermittent    Aggravating Factors   worst it gets is 9/10 - bending, stooping,  leaning forward    Pain Relieving Factors  standing upright, leaning back, back supported           [facility] Adult PT Treatment/Exercise - 12/23/18 0001      Lumbar Exercises: Stretches   Press Ups  15 reps;5 seconds      Lumbar Exercises: Supine   Bridge  15 reps    Bridge Limitations  RTB      Lumbar Exercises: Sidelying   Clam  Both;15 reps;3 seconds    Clam Limitations  RTB    Other Sidelying Lumbar Exercises  bil thoracic rotation hips 90/90 5x10" holds (elbow bent to reduce shoulder pain)      Manual Therapy   Manual Therapy  Joint mobilization;Soft tissue mobilization    Manual therapy comments  separate rest of treatment    Joint Mobilization  Grade II-III CPAs to C2-L5, to reduce pain and improve mobility    Soft tissue mobilization  STM to bil upper trap and cervical paraspinals to reudce restrictions and pain.            PT Education - 12/23/18 1301    Education Details  exercise technique, continue HEP    Person(s) Educated  Patient    Methods  Explanation;Demonstration    Comprehension  Returned demonstration;Verbalized understanding       PT Short Term Goals - 12/11/18 1620      PT SHORT TERM GOAL #1   Title   Patient will be independent with HEP, updated PRN, to reduce pain and improve mobility to improve QOL throughout his daily activities.    Time  2    Period  Weeks    Status  New    Target Date  12/25/18      PT SHORT TERM GOAL #2   Title  Patient will demonstrate correct log roll technique to reduce strain on low back with supien to sit transfers.    Time  2    Period  Weeks    Status  New        PT Long Term Goals - 12/11/18 1622      PT LONG TERM GOAL #1   Title  Patient will improve limited cervical and lumbar ROM to WFL's to demonstrate improve joint mobility and flexibility, to reduce pain duirng funcitonal activities such as lifting groceries to put away, turnign head when driving, and walking dogs.     Time  4    Period  Weeks     Status  New    Target Date  01/08/19      PT LONG TERM GOAL #2   Title  Patient will improve bil LE strength by 1 grade for each limited muscle group to demonstrate significant improvement in  muscle activation and functional strengrh.     Time  4    Period  Weeks    Status  New      PT LONG TERM GOAL #3   Title  Patient will improve 5x sit to stand testing by 6 seconds or more to demonstrate significant improvement in LE strength, and improved performance of transitional movements. He will deny pain with transitional movement.    Time  4    Period  Weeks    Status  New      PT LONG TERM GOAL #4   Title  Patient will improve FOTO to 38% or less to indicated reduce self reported limitation related to low back pain with daily activities.     Time  4    Period  Weeks    Status  New            Plan - 12/23/18 1335    Clinical Impression Statement  Session limited as pt requesting to leave early for another appointment in [place]. Added clams and bridge to HEP for continued hip strengthening to reduce LBP. Resumed CPA joint mobs C2-L5 this date; pt generally stiff throughout all but mostly in thoracic spine. Ended with manual STM to bil upper trap and cervical paraspinals to reduce restrictions and pain. Pt reported feeling better at EOS. Pt also wishing to reduce frequency to 1x/week due to financial reasons. Continue as planned, progressing as able and updated HEP frequently since he is reducing his frequency of therapy sessions.     Rehab Potential  Fair    PT Frequency  2x / week    PT Duration  4 weeks    PT Treatment/Interventions  ADLs/Self Care Home Management;Aquatic Therapy;Cryotherapy;Electrical Stimulation;Moist Heat;Traction;Functional mobility training;Therapeutic activities;Therapeutic exercise;Balance training;Neuromuscular re-education;Cognitive remediation;Patient/family education;Manual techniques;Passive range of motion    PT Next Visit Plan  Begin postural  strengthening, Perform PA's to lumbar spine, grade 1 if with PT, and initiate extension posture stretches, cervical ROM exercsies. Pt has reported pain relief with tilt table and may benefit from manual traction of lumbar and cervical spine.     PT Home Exercise Plan  Eval: lumbar extension at counter; 12/20: POE; 12/30: bridge and sidelying clam RTB    Consulted and Agree with Plan of Care  Patient       Patient will benefit from skilled therapeutic intervention in order to improve the following deficits and impairments:  Improper body mechanics, Pain, Postural dysfunction, Decreased mobility, Hypomobility, Decreased strength, Decreased range of motion, Decreased activity tolerance, Impaired flexibility, Decreased balance  Visit Diagnosis: Chronic midline low back pain without sciatica  Cervicalgia  Abnormal posture  Muscle weakness (generalized)     Problem List Patient Active Problem List   Diagnosis Date Noted  . Diverticulosis of colon without hemorrhage   . Solitary pulmonary nodule 02/10/2016  . Abnormal CT scan 02/02/2016  . Chronic GI bleeding 02/01/2016  . Anemia due to chronic blood loss 02/01/2016  . Colon polyps 01/04/2016  . 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 Gastric erosion with bleeding 01/02/2016  . Duodenal erosion 01/02/2016  . Tobacco abuse 01/01/2016  . Essential hypertension 01/01/2016  . Mucosal abnormality of stomach   . GI bleed 12/31/2015  . Diabetes mellitus without complication ([place])   . Hyperlipidemia   . BPH (benign prostatic hyperplasia)   . Carotid artery disease ([place])   . OSA on CPAP   . COPD (chronic obstructive pulmonary disease) ([place])  Geraldine Solar PT, [place] [address] [place], [place], [address] Phone: [phone]   Fax:  [phone]  Name: DENHAM MOSE MRN: [id number] Date of Birth: 1941/05/13

## 2018-12-26 ENCOUNTER — Ambulatory Visit (HOSPITAL_COMMUNITY): Payer: Medicare HMO | Attending: Neurosurgery

## 2018-12-26 ENCOUNTER — Encounter (HOSPITAL_COMMUNITY): Payer: Self-pay

## 2018-12-26 DIAGNOSIS — M545 Low back pain, unspecified: Secondary | ICD-10-CM

## 2018-12-26 DIAGNOSIS — G8929 Other chronic pain: Secondary | ICD-10-CM | POA: Diagnosis not present

## 2018-12-26 DIAGNOSIS — M6281 Muscle weakness (generalized): Secondary | ICD-10-CM | POA: Diagnosis not present

## 2018-12-26 DIAGNOSIS — R293 Abnormal posture: Secondary | ICD-10-CM | POA: Insufficient documentation

## 2018-12-26 DIAGNOSIS — M542 Cervicalgia: Secondary | ICD-10-CM | POA: Diagnosis not present

## 2018-12-26 NOTE — Therapy (Signed)
[place], [place], [address] Phone: [phone]   Fax:  [phone]  Physical Therapy Treatment  Patient Details  Name: Todd George MRN: [id number] Date of Birth: February 05, 1941 Referring Provider (PT): Erline Levine, MD   Encounter Date: 12/26/2018  PT End of Session - 12/26/18 1039    Visit Number  5    Number of Visits  9    Date for PT Re-Evaluation  01/08/19    Authorization Type  [employer] H[employer] ($40 co-pay, no auth required, no visit limit)    Authorization Time Period  12/11/18-01/10/19    Authorization - Visit Number  5    Authorization - Number of Visits  10    PT Start Time  1039   pt late   PT Stop Time  1112    PT Time Calculation (min)  33 min    Activity Tolerance  Patient tolerated treatment well    Behavior During Therapy  [facility] for tasks assessed/performed       Past Medical History:  Diagnosis Date  . Arthritis   . 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 BPH (benign prostatic hyperplasia)   . Cancer ([place])    skin cancer  . Carotid artery disease ([place])   . Cellulitis   . Chronic back pain   . Colon polyps 01/04/2016  . COPD (chronic obstructive pulmonary disease) ([place])    2ppd  . Diabetes mellitus without complication ([place])   . Duodenal erosion 01/02/2016  . Gastric erosion with bleeding 01/02/2016  . Hyperlipidemia   . Hypertension   . OSA on CPAP    not used machine in over 10 yrs    Past Surgical History:  Procedure Laterality Date  . CHOLECYSTECTOMY    . COLONOSCOPY N/A 01/03/2016   RMR: Active bleeding seen arising from the small bowel. Multiple cecal/ascending colon AVMs ablated. 1.5 cm carpet polyp in the ascending segment status post piecemeal snare polypectomy and tattooing. Innocent appearing colonic diverticulosis.  . COLONOSCOPY N/A 06/14/2016   Procedure: COLONOSCOPY;  Surgeon: Daneil Dolin, MD;  [facility];  Service:  Endoscopy;  Laterality: N/A;  1200  . COLONOSCOPY WITH PROPOFOL N/A 11/28/2018   Procedure: COLONOSCOPY WITH PROPOFOL;  Surgeon: Daneil Dolin, MD;  [facility];  Service: Endoscopy;  Laterality: N/A;  8:30am  . ESOPHAGOGASTRODUODENOSCOPY N/A 01/01/2016   RMR: Multiple gastric and duodenal erosions likely NSAID related.  Marland Kitchen GIVENS CAPSULE STUDY N/A 01/03/2016   Incomplete study, capsule never reach cecum. Scattered erosions and AVMs throughout the small bowel.  Marland Kitchen JOINT REPLACEMENT    . POLYPECTOMY  11/28/2018   Procedure: POLYPECTOMY;  Surgeon: Daneil Dolin, MD;  [facility];  Service: Endoscopy;;  ascending colon  . REPLACEMENT TOTAL KNEE Left   . SHOULDER ARTHROSCOPY WITH DISTAL CLAVICLE RESECTION Right 05/28/2015   Procedure: SHOULDER ARTHROSCOPY WITH DISTAL CLAVICLE RESECTION;  Surgeon: Melrose Nakayama, MD;  Location: [place];  Service: Orthopedics;  Laterality: Right;  . SHOULDER ARTHROSCOPY WITH SUBACROMIAL DECOMPRESSION Right 05/28/2015   Procedure: SHOULDER ARTHROSCOPY WITH SUBACROMIAL DECOMPRESSION;  Surgeon: Melrose Nakayama, MD;  Location: [place];  Service: Orthopedics;  Laterality: Right;  . THROAT SURGERY  1965    There were no vitals filed for this visit.  Subjective Assessment - 12/26/18 1039    Subjective  Pt states that everything is hurting all over today. He has woken up  like this for the past 2 days. He is not sure what flared up his pain.     Pertinent History  Lt TKA, Rt shoulder arthroscopy    Limitations  Lifting;Sitting;Standing;House hold activities;Walking    Patient Stated Goals  pain relief    Currently in Pain?  Yes    Pain Score  6    neck and back   Pain Location  --   "all over"   Pain Onset  More than a month ago    Pain Frequency  Intermittent    Aggravating Factors   worst it gets is 9/10 - bending, stooping, leaning forward    Pain Relieving Factors  standing upright, leaning back, back supported               [facility] Adult PT Treatment/Exercise - 12/26/18 0001      Lumbar Exercises: Stretches   Single Knee to Chest Stretch  Right;Left;3 reps;20 seconds    Lower Trunk Rotation  5 reps;10 seconds    Lower Trunk Rotation Limitations  bil      Manual Therapy   Manual Therapy  Soft tissue mobilization    Manual therapy comments  separate rest of treatment    Soft tissue mobilization  STM to bil upper trap and cervical paraspinals and lumbar paraspinals and QL to reudce restrictions and pain.              PT Education - 12/26/18 1039    Education Details  exercise technique, contiue HEP    Person(s) Educated  Patient    Methods  Explanation    Comprehension  Verbalized understanding;Returned demonstration       PT Short Term Goals - 12/11/18 1620      PT SHORT TERM GOAL #1   Title   Patient will be independent with HEP, updated PRN, to reduce pain and improve mobility to improve QOL throughout his daily activities.    Time  2    Period  Weeks    Status  New    Target Date  12/25/18      PT SHORT TERM GOAL #2   Title  Patient will demonstrate correct log roll technique to reduce strain on low back with supien to sit transfers.    Time  2    Period  Weeks    Status  New        PT Long Term Goals - 12/11/18 1622      PT LONG TERM GOAL #1   Title  Patient will improve limited cervical and lumbar ROM to WFL's to demonstrate improve joint mobility and flexibility, to reduce pain duirng funcitonal activities such as lifting groceries to put away, turnign head when driving, and walking dogs.     Time  4    Period  Weeks    Status  New    Target Date  01/08/19      PT LONG TERM GOAL #2   Title  Patient will improve bil LE strength by 1 grade for each limited muscle group to demonstrate significant improvement in muscle activation and functional strengrh.     Time  4    Period  Weeks    Status  New      PT LONG TERM GOAL #3   Title  Patient will improve 5x  sit to stand testing by 6 seconds or more to demonstrate significant improvement in LE strength, and improved performance of transitional movements. He will deny pain with  transitional movement.    Time  4    Period  Weeks    Status  New      PT LONG TERM GOAL #4   Title  Patient will improve FOTO to 38% or less to indicated reduce self reported limitation related to low back pain with daily activities.     Time  4    Period  Weeks    Status  New            Plan - 12/26/18 1114    Clinical Impression Statement  Session limited as pt arrived late for appointment. Pt presenting to therapy stating he was in increased pain in his neck and lower back and wished to not perform exercises today. Performed LTRs for lower back stretching and then performed manual therapy for cervical and lumbar mm. Pt reporting reduced pain to 3/10 neck and 0/10 lower back (was 6/10 for both at start of session). Continue as planned, progressing as able.     Rehab Potential  Fair    PT Frequency  2x / week    PT Duration  4 weeks    PT Treatment/Interventions  ADLs/Self Care Home Management;Aquatic Therapy;Cryotherapy;Electrical Stimulation;Moist Heat;Traction;Functional mobility training;Therapeutic activities;Therapeutic exercise;Balance training;Neuromuscular re-education;Cognitive remediation;Patient/family education;Manual techniques;Passive range of motion    PT Next Visit Plan  Begin postural strengthening, Perform PA's to lumbar spine, grade 1 if with PT, and initiate extension posture stretches, cervical ROM exercsies. Pt has reported pain relief with tilt table and may benefit from manual traction of lumbar and cervical spine.     PT Home Exercise Plan  Eval: lumbar extension at counter; 12/20: POE; 12/30: bridge and sidelying clam RTB; 1/2: LTRs    Consulted and Agree with Plan of Care  Patient       Patient will benefit from skilled therapeutic intervention in order to improve the following deficits  and impairments:  Improper body mechanics, Pain, Postural dysfunction, Decreased mobility, Hypomobility, Decreased strength, Decreased range of motion, Decreased activity tolerance, Impaired flexibility, Decreased balance  Visit Diagnosis: Chronic midline low back pain without sciatica  Cervicalgia  Abnormal posture  Muscle weakness (generalized)     Problem List Patient Active Problem List   Diagnosis Date Noted  . Diverticulosis of colon without hemorrhage   . Solitary pulmonary nodule 02/10/2016  . Abnormal CT scan 02/02/2016  . Chronic GI bleeding 02/01/2016  . Anemia due to chronic blood loss 02/01/2016  . Colon polyps 01/04/2016  . 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 Gastric erosion with bleeding 01/02/2016  . Duodenal erosion 01/02/2016  . Tobacco abuse 01/01/2016  . Essential hypertension 01/01/2016  . Mucosal abnormality of stomach   . GI bleed 12/31/2015  . Diabetes mellitus without complication ([place])   . Hyperlipidemia   . BPH (benign prostatic hyperplasia)   . Carotid artery disease ([place])   . OSA on CPAP   . COPD (chronic obstructive pulmonary disease) ([place])        Geraldine Solar PT, [place], [place], [address] Phone: [phone]   Fax:  [phone]  Name: Todd George MRN: [id number] Date of Birth: 14-Apr-1941

## 2018-12-27 ENCOUNTER — Telehealth (HOSPITAL_COMMUNITY): Payer: Self-pay | Admitting: Internal Medicine

## 2018-12-27 NOTE — Telephone Encounter (Signed)
12/27/18 pt wanted to cx these and we rescehduled for 1/9

## 2019-01-01 ENCOUNTER — Encounter (HOSPITAL_COMMUNITY): Payer: Medicare HMO

## 2019-01-02 ENCOUNTER — Ambulatory Visit (HOSPITAL_COMMUNITY): Payer: Medicare HMO

## 2019-01-02 ENCOUNTER — Encounter (HOSPITAL_COMMUNITY): Payer: Self-pay

## 2019-01-02 DIAGNOSIS — G8929 Other chronic pain: Secondary | ICD-10-CM | POA: Diagnosis not present

## 2019-01-02 DIAGNOSIS — M542 Cervicalgia: Secondary | ICD-10-CM

## 2019-01-02 DIAGNOSIS — M545 Low back pain, unspecified: Secondary | ICD-10-CM

## 2019-01-02 DIAGNOSIS — M6281 Muscle weakness (generalized): Secondary | ICD-10-CM | POA: Diagnosis not present

## 2019-01-02 DIAGNOSIS — R293 Abnormal posture: Secondary | ICD-10-CM | POA: Diagnosis not present

## 2019-01-02 NOTE — Therapy (Signed)
[place], [place], [address] Phone: [phone]   Fax:  [phone]  Physical Therapy Treatment  Patient Details  Name: Todd George MRN: [id number] Date of Birth: Apr 04, 1941 Referring Provider (PT): Erline Levine, MD   Encounter Date: 01/02/2019  PT End of Session - 01/02/19 1431    Visit Number  6    Number of Visits  9    Date for PT Re-Evaluation  01/08/19    Authorization Type  [employer] H[employer] ($40 co-pay, no auth required, no visit limit)    Authorization Time Period  12/11/18-01/10/19    Authorization - Visit Number  6    Authorization - Number of Visits  10    PT Start Time  1430    PT Stop Time  1512    PT Time Calculation (min)  42 min    Activity Tolerance  Patient tolerated treatment well    Behavior During Therapy  [facility] for tasks assessed/performed       Past Medical History:  Diagnosis Date  . Arthritis   . 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 BPH (benign prostatic hyperplasia)   . Cancer ([place])    skin cancer  . Carotid artery disease ([place])   . Cellulitis   . Chronic back pain   . Colon polyps 01/04/2016  . COPD (chronic obstructive pulmonary disease) ([place])    2ppd  . Diabetes mellitus without complication ([place])   . Duodenal erosion 01/02/2016  . Gastric erosion with bleeding 01/02/2016  . Hyperlipidemia   . Hypertension   . OSA on CPAP    not used machine in over 10 yrs    Past Surgical History:  Procedure Laterality Date  . CHOLECYSTECTOMY    . COLONOSCOPY N/A 01/03/2016   RMR: Active bleeding seen arising from the small bowel. Multiple cecal/ascending colon AVMs ablated. 1.5 cm carpet polyp in the ascending segment status post piecemeal snare polypectomy and tattooing. Innocent appearing colonic diverticulosis.  . COLONOSCOPY N/A 06/14/2016   Procedure: COLONOSCOPY;  Surgeon: Daneil Dolin, MD;  [facility];  Service: Endoscopy;   Laterality: N/A;  1200  . COLONOSCOPY WITH PROPOFOL N/A 11/28/2018   Procedure: COLONOSCOPY WITH PROPOFOL;  Surgeon: Daneil Dolin, MD;  [facility];  Service: Endoscopy;  Laterality: N/A;  8:30am  . ESOPHAGOGASTRODUODENOSCOPY N/A 01/01/2016   RMR: Multiple gastric and duodenal erosions likely NSAID related.  Marland Kitchen GIVENS CAPSULE STUDY N/A 01/03/2016   Incomplete study, capsule never reach cecum. Scattered erosions and AVMs throughout the small bowel.  Marland Kitchen JOINT REPLACEMENT    . POLYPECTOMY  11/28/2018   Procedure: POLYPECTOMY;  Surgeon: Daneil Dolin, MD;  [facility];  Service: Endoscopy;;  ascending colon  . REPLACEMENT TOTAL KNEE Left   . SHOULDER ARTHROSCOPY WITH DISTAL CLAVICLE RESECTION Right 05/28/2015   Procedure: SHOULDER ARTHROSCOPY WITH DISTAL CLAVICLE RESECTION;  Surgeon: Melrose Nakayama, MD;  Location: [place];  Service: Orthopedics;  Laterality: Right;  . SHOULDER ARTHROSCOPY WITH SUBACROMIAL DECOMPRESSION Right 05/28/2015   Procedure: SHOULDER ARTHROSCOPY WITH SUBACROMIAL DECOMPRESSION;  Surgeon: Melrose Nakayama, MD;  Location: [place];  Service: Orthopedics;  Laterality: Right;  . THROAT SURGERY  1965    There were no vitals filed for this visit.  Subjective Assessment - 01/02/19 1434    Subjective  Pt states that his lower back has been feeling relatively good. His neck has been bothering  him more. The R side has been bothering him more.     Pertinent History  Lt TKA, Rt shoulder arthroscopy    Limitations  Lifting;Sitting;Standing;House hold activities;Walking    Patient Stated Goals  pain relief    Currently in Pain?  Yes    Pain Score  4     Pain Location  Neck    Pain Orientation  Right    Pain Descriptors / Indicators  Aching    Pain Type  Chronic pain    Pain Onset  More than a month ago    Pain Frequency  Intermittent    Aggravating Factors   worst it gets is 9/10 - bending, stooping, leaning forward    Pain  Relieving Factors  standing upright, leaning back, back supported               [facility] Adult PT Treatment/Exercise - 01/02/19 0001      Neck Exercises: Standing   Other Standing Exercises  Ys on wall +liftoff x15 reps      Neck Exercises: Seated   Neck Retraction  15 reps    Neck Retraction Limitations  cues for form and posturing    Cervical Rotation  Both;10 reps    Cervical Rotation Limitations  with towel to assistance    Other Seated Exercise  3D thoracic excursions with UE x10 reps each      Manual Therapy   Manual Therapy  Soft tissue mobilization    Manual therapy comments  separate rest of treatment    Soft tissue mobilization  STM to R upper trap, levator scap to reduce pain and restrictions             PT Education - 01/02/19 1431    Education Details  exercise technique, will reassess next visit    Person(s) Educated  Patient    Methods  Explanation;Demonstration    Comprehension  Verbalized understanding;Returned demonstration       PT Short Term Goals - 12/11/18 1620      PT SHORT TERM GOAL #1   Title   Patient will be independent with HEP, updated PRN, to reduce pain and improve mobility to improve QOL throughout his daily activities.    Time  2    Period  Weeks    Status  New    Target Date  12/25/18      PT SHORT TERM GOAL #2   Title  Patient will demonstrate correct log roll technique to reduce strain on low back with supien to sit transfers.    Time  2    Period  Weeks    Status  New        PT Long Term Goals - 12/11/18 1622      PT LONG TERM GOAL #1   Title  Patient will improve limited cervical and lumbar ROM to WFL's to demonstrate improve joint mobility and flexibility, to reduce pain duirng funcitonal activities such as lifting groceries to put away, turnign head when driving, and walking dogs.     Time  4    Period  Weeks    Status  New    Target Date  01/08/19      PT LONG TERM GOAL #2   Title  Patient will improve bil LE  strength by 1 grade for each limited muscle group to demonstrate significant improvement in muscle activation and functional strengrh.     Time  4    Period  Weeks  Status  New      PT LONG TERM GOAL #3   Title  Patient will improve 5x sit to stand testing by 6 seconds or more to demonstrate significant improvement in LE strength, and improved performance of transitional movements. He will deny pain with transitional movement.    Time  4    Period  Weeks    Status  New      PT LONG TERM GOAL #4   Title  Patient will improve FOTO to 38% or less to indicated reduce self reported limitation related to low back pain with daily activities.     Time  4    Period  Weeks    Status  New            Plan - 01/02/19 1518    Clinical Impression Statement  Pt presented to therapy with a back brace and asked about benefits/risks of using; PT explained that for short term relief, it is okay, but that we like for patients to use their own core strength long term. Pt stating that his LBP is manageable/under control at this point but his neck is still bothering him so he would like to focus on that going forward. Performed cervical ROM, thoracic mobility, and lower trap strengthening. He continues to require cues to reduce L lateral flexion of neck. Ended with manual for soft tissue restrictions in R upper trap, levator scap; recreation of pain reported. Educated pt on risks and benefits of trigger point dry needling as PT feels this would greatly reduce his soft tissue restrictions and pain; pt verbalized consent to begin when able. Pt due for reassessment next visit.     Rehab Potential  Fair    PT Frequency  2x / week    PT Duration  4 weeks    PT Treatment/Interventions  ADLs/Self Care Home Management;Aquatic Therapy;Cryotherapy;Electrical Stimulation;Moist Heat;Traction;Functional mobility training;Therapeutic activities;Therapeutic exercise;Balance training;Neuromuscular re-education;Cognitive  remediation;Patient/family education;Manual techniques;Passive range of motion    PT Next Visit Plan  reassessment; begin dry needling; continue postural strengthening, Perform PA's to lumbar spine, grade 1 if with PT, and initiate extension posture stretches, cervical ROM exercsies. Pt has reported pain relief with tilt table and may benefit from manual traction of lumbar and cervical spine.     PT Home Exercise Plan  Eval: lumbar extension at counter; 12/20: POE; 12/30: bridge and sidelying clam RTB; 1/2: LTRs    Consulted and Agree with Plan of Care  Patient       Patient will benefit from skilled therapeutic intervention in order to improve the following deficits and impairments:  Improper body mechanics, Pain, Postural dysfunction, Decreased mobility, Hypomobility, Decreased strength, Decreased range of motion, Decreased activity tolerance, Impaired flexibility, Decreased balance  Visit Diagnosis: Chronic midline low back pain without sciatica  Cervicalgia  Abnormal posture  Muscle weakness (generalized)     Problem List Patient Active Problem List   Diagnosis Date Noted  . Diverticulosis of colon without hemorrhage   . Solitary pulmonary nodule 02/10/2016  . Abnormal CT scan 02/02/2016  . Chronic GI bleeding 02/01/2016  . Anemia due to chronic blood loss 02/01/2016  . Colon polyps 01/04/2016  . 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 Gastric erosion with bleeding 01/02/2016  . Duodenal erosion 01/02/2016  . Tobacco abuse 01/01/2016  . Essential hypertension 01/01/2016  . Mucosal abnormality of stomach   . GI bleed 12/31/2015  . Diabetes mellitus without complication ([place])   . Hyperlipidemia   .  BPH (benign prostatic hyperplasia)   . Carotid artery disease ([place])   . OSA on CPAP   . COPD (chronic obstructive pulmonary disease) ([place])        Geraldine Solar PT, [place], [place], [address] Phone: [phone]   Fax:  [phone]  Name: Todd George MRN: [id number] Date of Birth: [birth year]

## 2019-01-08 ENCOUNTER — Ambulatory Visit (HOSPITAL_COMMUNITY): Payer: Medicare HMO

## 2019-01-08 ENCOUNTER — Encounter (HOSPITAL_COMMUNITY): Payer: Medicare HMO

## 2019-01-08 ENCOUNTER — Encounter (HOSPITAL_COMMUNITY): Payer: Self-pay

## 2019-01-08 DIAGNOSIS — M545 Low back pain, unspecified: Secondary | ICD-10-CM

## 2019-01-08 DIAGNOSIS — M542 Cervicalgia: Secondary | ICD-10-CM

## 2019-01-08 DIAGNOSIS — G8929 Other chronic pain: Secondary | ICD-10-CM

## 2019-01-08 DIAGNOSIS — M6281 Muscle weakness (generalized): Secondary | ICD-10-CM

## 2019-01-08 DIAGNOSIS — R293 Abnormal posture: Secondary | ICD-10-CM

## 2019-01-08 NOTE — Therapy (Signed)
[place], [place], [address] Phone: [phone]   Fax:  [phone]  Physical Therapy Treatment  Patient Details  Name: Todd George MRN: [id number] Date of Birth: Jul 27, 1941 Referring Provider (PT): Erline Levine, MD   Encounter Date: 01/08/2019  PT End of Session - 01/08/19 1300    Visit Number  7    Number of Visits  13    Date for PT Re-Evaluation  03/05/19    Authorization Type  [employer] H[employer] ($40 co-pay, no auth required, no visit limit)    Authorization Time Period  12/11/18-01/10/19; NEW: 01/08/19 to 03/05/19     Authorization - Visit Number  0    Authorization - Number of Visits  10    PT Start Time  1259    PT Stop Time  1341    PT Time Calculation (min)  42 min    Activity Tolerance  Patient tolerated treatment well    Behavior During Therapy  [facility] for tasks assessed/performed       Past Medical History:  Diagnosis Date  . Arthritis   . 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 BPH (benign prostatic hyperplasia)   . Cancer ([place])    skin cancer  . Carotid artery disease ([place])   . Cellulitis   . Chronic back pain   . Colon polyps 01/04/2016  . COPD (chronic obstructive pulmonary disease) ([place])    2ppd  . Diabetes mellitus without complication ([place])   . Duodenal erosion 01/02/2016  . Gastric erosion with bleeding 01/02/2016  . Hyperlipidemia   . Hypertension   . OSA on CPAP    not used machine in over 10 yrs    Past Surgical History:  Procedure Laterality Date  . CHOLECYSTECTOMY    . COLONOSCOPY N/A 01/03/2016   RMR: Active bleeding seen arising from the small bowel. Multiple cecal/ascending colon AVMs ablated. 1.5 cm carpet polyp in the ascending segment status post piecemeal snare polypectomy and tattooing. Innocent appearing colonic diverticulosis.  . COLONOSCOPY N/A 06/14/2016   Procedure: COLONOSCOPY;  Surgeon: Daneil Dolin, MD;  [facility];  Service: Endoscopy;  Laterality: N/A;  1200  . COLONOSCOPY WITH PROPOFOL N/A 11/28/2018   Procedure: COLONOSCOPY WITH PROPOFOL;  Surgeon: Daneil Dolin, MD;  [facility];  Service: Endoscopy;  Laterality: N/A;  8:30am  . ESOPHAGOGASTRODUODENOSCOPY N/A 01/01/2016   RMR: Multiple gastric and duodenal erosions likely NSAID related.  Marland Kitchen GIVENS CAPSULE STUDY N/A 01/03/2016   Incomplete study, capsule never reach cecum. Scattered erosions and AVMs throughout the small bowel.  Marland Kitchen JOINT REPLACEMENT    . POLYPECTOMY  11/28/2018   Procedure: POLYPECTOMY;  Surgeon: Daneil Dolin, MD;  [facility];  Service: Endoscopy;;  ascending colon  . REPLACEMENT TOTAL KNEE Left   . SHOULDER ARTHROSCOPY WITH DISTAL CLAVICLE RESECTION Right 05/28/2015   Procedure: SHOULDER ARTHROSCOPY WITH DISTAL CLAVICLE RESECTION;  Surgeon: Melrose Nakayama, MD;  Location: [place];  Service: Orthopedics;  Laterality: Right;  . SHOULDER ARTHROSCOPY WITH SUBACROMIAL DECOMPRESSION Right 05/28/2015   Procedure: SHOULDER ARTHROSCOPY WITH SUBACROMIAL DECOMPRESSION;  Surgeon: Melrose Nakayama, MD;  Location: [place];  Service: Orthopedics;  Laterality: Right;  . THROAT SURGERY  1965    There were no vitals filed for this visit.  Subjective Assessment - 01/08/19 1300    Subjective  Pt reports that he was pain-free Thurs - Saturday but had  increased pain Sun - Tues. Current pain down to 1/10 on the R side.    Pertinent History  Lt TKA, Rt shoulder arthroscopy    Limitations  Lifting;Sitting;Standing;House hold activities;Walking    Patient Stated Goals  pain relief    Currently in Pain?  Yes    Pain Score  1     Pain Location  Neck    Pain Orientation  Right    Pain Descriptors / Indicators  Aching    Pain Type  Chronic pain    Pain Onset  More than a month ago    Pain Frequency  Intermittent    Aggravating Factors   worst it gets is 9/10 - bending, stooping, leaning forward     Pain Relieving Factors  standing upright, leaning back, back supported         [facility] PT Assessment - 01/08/19 0001      Observation/Other Assessments   Focus on Therapeutic Outcomes (FOTO)   32% limited      AROM   Cervical Extension  40   was 35   Cervical - Right Side Bend  15   wsa 15   Cervical - Right Rotation  60   was 50   Cervical - Left Rotation  50   was 60   Lumbar Flexion  80   was 60   Lumbar Extension  20   was 20   Lumbar - Right Side Bend  14   was 10   Lumbar - Left Side Bend  19   was 22   Lumbar - Right Rotation  WFL   was 8   Lumbar - Left Rotation  WFL   was 8     Strength   Right Hip Flexion  4+/5   was 4+   Right Hip Extension  4/5   was 3   Right Hip ABduction  4+/5   was 4+   Left Hip Flexion  5/5   was 4+   Left Hip Extension  4-/5   was 3+   Left Hip ABduction  5/5   was 4   Right Knee Flexion  4+/5   was 4   Right Knee Extension  5/5   wa 4   Left Knee Flexion  4+/5   was 4+     Transfers   Five time sit to stand comments   23sec, no UE, chair      Standardized Balance Assessment   Standardized Balance Assessment  --           [facility] Adult PT Treatment/Exercise - 01/08/19 0001      Manual Therapy   Manual Therapy  Soft tissue mobilization    Manual therapy comments  separate rest of treatment    Soft tissue mobilization  STM after needling to R upper trap, levator scap to reduce pain and restrictions       Trigger Point Dry Needling - 01/08/19 1337    Consent Given?  Yes    Education Handout Provided  Yes   at last visit   Muscles Treated Upper Body  Upper trapezius    Upper Trapezius Response  Twitch reponse elicited;Palpable increased muscle length   R in prone          PT Education - 01/08/19 1300    Education Details  reassessment findings, continue HEP    Person(s) Educated  Patient    Methods  Explanation;Demonstration    Comprehension  Verbalized understanding;Returned demonstration  PT Short Term Goals - 01/08/19 1304      PT SHORT TERM GOAL #1   Title   Patient will be independent with HEP, updated PRN, to reduce pain and improve mobility to improve QOL throughout his daily activities.    Time  2    Period  Weeks    Status  Achieved      PT SHORT TERM GOAL #2   Title  Patient will demonstrate correct log roll technique to reduce strain on low back with supien to sit transfers.    Time  2    Period  Weeks    Status  Achieved        PT Long Term Goals - 01/08/19 1305      PT LONG TERM GOAL #1   Title  Patient will improve limited cervical and lumbar ROM to WFL's to demonstrate improve joint mobility and flexibility, to reduce pain duirng funcitonal activities such as lifting groceries to put away, turnign head when driving, and walking dogs.     Time  4    Period  Weeks    Status  Partially Met      PT LONG TERM GOAL #2   Title  Patient will improve bil LE strength by 1 grade for each limited muscle group to demonstrate significant improvement in muscle activation and functional strengrh.     Time  4    Period  Weeks    Status  Partially Met      PT LONG TERM GOAL #3   Title  Patient will improve 5x sit to stand testing by 6 seconds or more to demonstrate significant improvement in LE strength, and improved performance of transitional movements. He will deny pain with transitional movement.    Baseline  1/15: improved by 3 sec to 23sec (was 26)    Time  4    Period  Weeks    Status  On-going      PT LONG TERM GOAL #4   Title  Patient will improve FOTO to 38% or less to indicated reduce self reported limitation related to low back pain with daily activities.     Baseline  1/15: 32% limited    Time  4    Period  Weeks    Status  Achieved            Plan - 01/08/19 1341    Clinical Impression Statement  PT reassessed pt's goals and outcome measures this date. Pt has made great progress towards goals as illustrated above. His cervical and  lumbar mobility have improved but he is still limited, mostly in L cervical rotation due to continued restrictions in R upper trap. Pt needs continues skilled PT intervention to address continued restrictions in order to further reduce pain and improve QOL. Initiated trigger point dry needling to pt's R upper trap in order to reduce these soft tissue restrictions and improve ROM. Good twitch responses elicited and followed up with manual for further tissue relaxation and improvements in flexibility. Ended with moist heat pack to assist with reducing post-needling soreness (unbilled). Continue as planned, progressing as able. Pt wishes to come every other week for 4 more sessions.     Rehab Potential  Fair    PT Frequency  Biweekly    PT Duration  8 weeks   4 more sessions   PT Treatment/Interventions  ADLs/Self Care Home Management;Aquatic Therapy;Cryotherapy;Electrical Stimulation;Moist Heat;Traction;Functional mobility training;Therapeutic activities;Therapeutic exercise;Balance training;Neuromuscular re-education;Cognitive remediation;Patient/family education;Manual techniques;Passive range of  motion    PT Next Visit Plan  cotninue dry needling; continue postural strengthening, Perform PA's to lumbar spine, grade 1 if with PT, and initiate extension posture stretches, cervical ROM exercsies. Pt has reported pain relief with tilt table and may benefit from manual traction of lumbar and cervical spine.     PT Home Exercise Plan  Eval: lumbar extension at counter; 12/20: POE; 12/30: bridge and sidelying clam RTB; 1/2: LTRs    Consulted and Agree with Plan of Care  Patient       Patient will benefit from skilled therapeutic intervention in order to improve the following deficits and impairments:  Improper body mechanics, Pain, Postural dysfunction, Decreased mobility, Hypomobility, Decreased strength, Decreased range of motion, Decreased activity tolerance, Impaired flexibility, Decreased balance  Visit  Diagnosis: Chronic midline low back pain without sciatica - Plan: PT plan of care cert/re-cert  Cervicalgia - Plan: PT plan of care cert/re-cert  Abnormal posture - Plan: PT plan of care cert/re-cert  Muscle weakness (generalized) - Plan: PT plan of care cert/re-cert     Problem List Patient Active Problem List   Diagnosis Date Noted  . Diverticulosis of colon without hemorrhage   . Solitary pulmonary nodule 02/10/2016  . Abnormal CT scan 02/02/2016  . Chronic GI bleeding 02/01/2016  . Anemia due to chronic blood loss 02/01/2016  . Colon polyps 01/04/2016  . 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 Gastric erosion with bleeding 01/02/2016  . Duodenal erosion 01/02/2016  . Tobacco abuse 01/01/2016  . Essential hypertension 01/01/2016  . Mucosal abnormality of stomach   . GI bleed 12/31/2015  . Diabetes mellitus without complication ([place])   . Hyperlipidemia   . BPH (benign prostatic hyperplasia)   . Carotid artery disease ([place])   . OSA on CPAP   . COPD (chronic obstructive pulmonary disease) ([place])        Geraldine Solar PT, [place], [place], [address] Phone: [phone]   Fax:  [phone]  Name: Todd George MRN: [id number] Date of Birth: [birth year]

## 2019-01-09 ENCOUNTER — Telehealth (HOSPITAL_COMMUNITY): Payer: Self-pay | Admitting: Internal Medicine

## 2019-01-09 NOTE — Telephone Encounter (Signed)
01/09/19  I CALLED PATIENT TO LET HIM KNOW WHEN HIS NEXT APPT WAS

## 2019-01-22 ENCOUNTER — Encounter (HOSPITAL_COMMUNITY): Payer: Self-pay

## 2019-01-22 ENCOUNTER — Ambulatory Visit (HOSPITAL_COMMUNITY): Payer: Medicare HMO

## 2019-01-22 DIAGNOSIS — M6281 Muscle weakness (generalized): Secondary | ICD-10-CM | POA: Diagnosis not present

## 2019-01-22 DIAGNOSIS — M545 Low back pain, unspecified: Secondary | ICD-10-CM

## 2019-01-22 DIAGNOSIS — G8929 Other chronic pain: Secondary | ICD-10-CM

## 2019-01-22 DIAGNOSIS — M542 Cervicalgia: Secondary | ICD-10-CM | POA: Diagnosis not present

## 2019-01-22 DIAGNOSIS — R293 Abnormal posture: Secondary | ICD-10-CM | POA: Diagnosis not present

## 2019-01-22 NOTE — Therapy (Signed)
[place], [place], [address] Phone: [phone]   Fax:  [phone]  Physical Therapy Treatment  Patient Details  Name: Todd George MRN: [id number] Date of Birth: 1941-05-13 Referring Provider (PT): Erline Levine, MD   Encounter Date: 01/22/2019  PT End of Session - 01/22/19 1259    Visit Number  8    Number of Visits  13    Date for PT Re-Evaluation  03/05/19    Authorization Type  [employer] H[employer] ($40 co-pay, no auth required, no visit limit)    Authorization Time Period  12/11/18-01/10/19; NEW: 01/08/19 to 03/05/19     Authorization - Visit Number  1    Authorization - Number of Visits  10    PT Start Time  3300    PT Stop Time  1339    PT Time Calculation (min)  41 min    Activity Tolerance  Patient tolerated treatment well    Behavior During Therapy  [facility] for tasks assessed/performed       Past Medical History:  Diagnosis Date  . Arthritis   . 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 BPH (benign prostatic hyperplasia)   . Cancer ([place])    skin cancer  . Carotid artery disease ([place])   . Cellulitis   . Chronic back pain   . Colon polyps 01/04/2016  . COPD (chronic obstructive pulmonary disease) ([place])    2ppd  . Diabetes mellitus without complication ([place])   . Duodenal erosion 01/02/2016  . Gastric erosion with bleeding 01/02/2016  . Hyperlipidemia   . Hypertension   . OSA on CPAP    not used machine in over 10 yrs    Past Surgical History:  Procedure Laterality Date  . CHOLECYSTECTOMY    . COLONOSCOPY N/A 01/03/2016   RMR: Active bleeding seen arising from the small bowel. Multiple cecal/ascending colon AVMs ablated. 1.5 cm carpet polyp in the ascending segment status post piecemeal snare polypectomy and tattooing. Innocent appearing colonic diverticulosis.  . COLONOSCOPY N/A 06/14/2016   Procedure: COLONOSCOPY;  Surgeon: Daneil Dolin, MD;  [facility];  Service: Endoscopy;  Laterality: N/A;  1200  . COLONOSCOPY WITH PROPOFOL N/A 11/28/2018   Procedure: COLONOSCOPY WITH PROPOFOL;  Surgeon: Daneil Dolin, MD;  [facility];  Service: Endoscopy;  Laterality: N/A;  8:30am  . ESOPHAGOGASTRODUODENOSCOPY N/A 01/01/2016   RMR: Multiple gastric and duodenal erosions likely NSAID related.  Marland Kitchen GIVENS CAPSULE STUDY N/A 01/03/2016   Incomplete study, capsule never reach cecum. Scattered erosions and AVMs throughout the small bowel.  Marland Kitchen JOINT REPLACEMENT    . POLYPECTOMY  11/28/2018   Procedure: POLYPECTOMY;  Surgeon: Daneil Dolin, MD;  [facility];  Service: Endoscopy;;  ascending colon  . REPLACEMENT TOTAL KNEE Left   . SHOULDER ARTHROSCOPY WITH DISTAL CLAVICLE RESECTION Right 05/28/2015   Procedure: SHOULDER ARTHROSCOPY WITH DISTAL CLAVICLE RESECTION;  Surgeon: Melrose Nakayama, MD;  Location: [place];  Service: Orthopedics;  Laterality: Right;  . SHOULDER ARTHROSCOPY WITH SUBACROMIAL DECOMPRESSION Right 05/28/2015   Procedure: SHOULDER ARTHROSCOPY WITH SUBACROMIAL DECOMPRESSION;  Surgeon: Melrose Nakayama, MD;  Location: [facility];  Service: Orthopedics;  Laterality: Right;  . THROAT SURGERY  1965    There were no vitals filed for this visit.  Subjective Assessment - 01/22/19 1259    Subjective  Pt is not having any back pain but is having a  little bit of neck pain. His shoulder is his main thing bothering him when he goes to use it but doesn't think anything can be done for that.     Pertinent History  Lt TKA, Rt shoulder arthroscopy    Limitations  Lifting;Sitting;Standing;House hold activities;Walking    Patient Stated Goals  pain relief    Currently in Pain?  No/denies    Pain Onset  More than a month ago             [facility] Adult PT Treatment/Exercise - 01/22/19 0001      Neck Exercises: Theraband   Other Theraband Exercises  bil rows GTB 2x10 reps (max cues for reduced upper trap  compensation)      Neck Exercises: Seated   Neck Retraction  15 reps    Neck Retraction Limitations  min cues for form and posturing    Other Seated Exercise  thoracic rotation with PVC pipe and bolster 10x5" holds each      Manual Therapy   Manual Therapy  Soft tissue mobilization    Manual therapy comments  separate rest of treatment    Soft tissue mobilization  STM after needling to R upper trap, levator scap to reduce pain and restrictions       Trigger Point Dry Needling - 01/22/19 1323    Consent Given?  Yes    Education Handout Provided  No    Muscles Treated Upper Body  Upper trapezius    Upper Trapezius Response  Twitch reponse elicited;Palpable increased muscle length   R in prone            PT Education - 01/22/19 1259    Education Details  conintue HEP    Person(s) Educated  Patient    Methods  Demonstration;Explanation    Comprehension  Verbalized understanding       PT Short Term Goals - 01/08/19 1304      PT SHORT TERM GOAL #1   Title   Patient will be independent with HEP, updated PRN, to reduce pain and improve mobility to improve QOL throughout his daily activities.    Time  2    Period  Weeks    Status  Achieved      PT SHORT TERM GOAL #2   Title  Patient will demonstrate correct log roll technique to reduce strain on low back with supien to sit transfers.    Time  2    Period  Weeks    Status  Achieved        PT Long Term Goals - 01/08/19 1305      PT LONG TERM GOAL #1   Title  Patient will improve limited cervical and lumbar ROM to WFL's to demonstrate improve joint mobility and flexibility, to reduce pain duirng funcitonal activities such as lifting groceries to put away, turnign head when driving, and walking dogs.     Time  4    Period  Weeks    Status  Partially Met      PT LONG TERM GOAL #2   Title  Patient will improve bil LE strength by 1 grade for each limited muscle group to demonstrate significant improvement in muscle  activation and functional strengrh.     Time  4    Period  Weeks    Status  Partially Met      PT LONG TERM GOAL #3   Title  Patient will improve 5x sit to stand testing by 6 seconds  or more to demonstrate significant improvement in LE strength, and improved performance of transitional movements. He will deny pain with transitional movement.    Baseline  1/15: improved by 3 sec to 23sec (was 26)    Time  4    Period  Weeks    Status  On-going      PT LONG TERM GOAL #4   Title  Patient will improve FOTO to 38% or less to indicated reduce self reported limitation related to low back pain with daily activities.     Baseline  1/15: 32% limited    Time  4    Period  Weeks    Status  Achieved            Plan - 01/22/19 1339    Clinical Impression Statement  Pt presenting to therapy with overall vast improvements since his last visit. Continued with min-mod restrictions in R upper trap so resumed dry needling to address. Followed up with manual to the R upper trap to further reduce pain and restrictions. Ended with postural strengthening and overall mobility. Added rowing with GTB for posture/middle trap strengthening; pt requiring max cues for reducing upper trap compensation. No pain at EOS, just some soreness from needling. Encouraged pt to continue HEP as is since it is working so well for him.     Rehab Potential  Fair    PT Frequency  Biweekly    PT Duration  8 weeks   4 more sessions   PT Treatment/Interventions  ADLs/Self Care Home Management;Aquatic Therapy;Cryotherapy;Electrical Stimulation;Moist Heat;Traction;Functional mobility training;Therapeutic activities;Therapeutic exercise;Balance training;Neuromuscular re-education;Cognitive remediation;Patient/family education;Manual techniques;Passive range of motion    PT Next Visit Plan  cotninue dry needling; continue postural strengthening, Perform PA's to lumbar spine, grade 1 if with PT, and initiate extension posture stretches,  cervical ROM exercsies. Pt has reported pain relief with tilt table and may benefit from manual traction of lumbar and cervical spine.     PT Home Exercise Plan  Eval: lumbar extension at counter; 12/20: POE; 12/30: bridge and sidelying clam RTB; 1/2: LTRs    Consulted and Agree with Plan of Care  Patient       Patient will benefit from skilled therapeutic intervention in order to improve the following deficits and impairments:  Improper body mechanics, Pain, Postural dysfunction, Decreased mobility, Hypomobility, Decreased strength, Decreased range of motion, Decreased activity tolerance, Impaired flexibility, Decreased balance  Visit Diagnosis: Cervicalgia  Chronic midline low back pain without sciatica  Abnormal posture  Muscle weakness (generalized)     Problem List Patient Active Problem List   Diagnosis Date Noted  . Diverticulosis of colon without hemorrhage   . Solitary pulmonary nodule 02/10/2016  . Abnormal CT scan 02/02/2016  . Chronic GI bleeding 02/01/2016  . Anemia due to chronic blood loss 02/01/2016  . Colon polyps 01/04/2016  . 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 Gastric erosion with bleeding 01/02/2016  . Duodenal erosion 01/02/2016  . Tobacco abuse 01/01/2016  . Essential hypertension 01/01/2016  . Mucosal abnormality of stomach   . GI bleed 12/31/2015  . Diabetes mellitus without complication ([place])   . Hyperlipidemia   . BPH (benign prostatic hyperplasia)   . Carotid artery disease ([place])   . OSA on CPAP   . COPD (chronic obstructive pulmonary disease) ([place])        Geraldine Solar PT, [place], [place], [address] Phone: [phone]  Fax:  [phone]  Name: Todd George MRN: [id number] Date of Birth: [birth year]

## 2019-01-23 DIAGNOSIS — H353211 Exudative age-related macular degeneration, right eye, with active choroidal neovascularization: Secondary | ICD-10-CM | POA: Diagnosis not present

## 2019-01-23 DIAGNOSIS — H353124 Nonexudative age-related macular degeneration, left eye, advanced atrophic with subfoveal involvement: Secondary | ICD-10-CM | POA: Diagnosis not present

## 2019-02-03 DIAGNOSIS — M5416 Radiculopathy, lumbar region: Secondary | ICD-10-CM | POA: Diagnosis not present

## 2019-02-03 DIAGNOSIS — M5412 Radiculopathy, cervical region: Secondary | ICD-10-CM | POA: Diagnosis not present

## 2019-02-03 DIAGNOSIS — M542 Cervicalgia: Secondary | ICD-10-CM | POA: Diagnosis not present

## 2019-02-03 DIAGNOSIS — M4126 Other idiopathic scoliosis, lumbar region: Secondary | ICD-10-CM | POA: Diagnosis not present

## 2019-02-05 ENCOUNTER — Ambulatory Visit (HOSPITAL_COMMUNITY): Payer: Medicare HMO | Attending: Neurosurgery

## 2019-02-05 ENCOUNTER — Encounter (HOSPITAL_COMMUNITY): Payer: Self-pay

## 2019-02-05 DIAGNOSIS — M542 Cervicalgia: Secondary | ICD-10-CM | POA: Insufficient documentation

## 2019-02-05 DIAGNOSIS — G8929 Other chronic pain: Secondary | ICD-10-CM | POA: Insufficient documentation

## 2019-02-05 DIAGNOSIS — M545 Low back pain, unspecified: Secondary | ICD-10-CM

## 2019-02-05 DIAGNOSIS — R293 Abnormal posture: Secondary | ICD-10-CM | POA: Diagnosis not present

## 2019-02-05 DIAGNOSIS — M6281 Muscle weakness (generalized): Secondary | ICD-10-CM | POA: Diagnosis not present

## 2019-02-05 NOTE — Therapy (Signed)
[facility] [address] [place], [place], [address] Phone: [phone]   Fax:  [phone]  Physical Therapy Treatment  Patient Details  Name: Todd George MRN: [id number] Date of Birth: 02/07/1941 Referring Provider (PT): Stern, Joseph, MD   Encounter Date: 02/05/2019  PT End of Session - 02/05/19 1259    Visit Number  9    Number of Visits  13    Date for PT Re-Evaluation  03/05/19    Authorization Type  [employer] H[employer] ($40 co-pay, no auth required, no visit limit)    Authorization Time Period  12/11/18-01/10/19; NEW: 01/08/19 to 03/05/19     Authorization - Visit Number  2    Authorization - Number of Visits  10    PT Start Time  1259    PT Stop Time  1341    PT Time Calculation (min)  42 min    Activity Tolerance  Patient tolerated treatment well    Behavior During Therapy  WFL for tasks assessed/performed       Past Medical History:  Diagnosis Date  . Arthritis   . 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 BPH (benign prostatic hyperplasia)   . Cancer ([facility])    skin cancer  . Carotid artery disease ([facility])   . Cellulitis   . Chronic back pain   . Colon polyps 01/04/2016  . COPD (chronic obstructive pulmonary disease) ([facility])    2ppd  . Diabetes mellitus without complication ([facility])   . Duodenal erosion 01/02/2016  . Gastric erosion with bleeding 01/02/2016  . Hyperlipidemia   . Hypertension   . OSA on CPAP    not used machine in over 10 yrs    Past Surgical History:  Procedure Laterality Date  . CHOLECYSTECTOMY    . COLONOSCOPY N/A 01/03/2016   RMR: Active bleeding seen arising from the small bowel. Multiple cecal/ascending colon AVMs ablated. 1.5 cm carpet polyp in the ascending segment status post piecemeal snare polypectomy and tattooing. Innocent appearing colonic diverticulosis.  . COLONOSCOPY N/A 06/14/2016   Procedure: COLONOSCOPY;  Surgeon: Robert M Rourk, MD;  [facility];  Service: Endoscopy;  Laterality: N/A;  1200  . COLONOSCOPY WITH PROPOFOL N/A 11/28/2018   Procedure: COLONOSCOPY WITH PROPOFOL;  Surgeon: Rourk, Robert M, MD;  [facility];  Service: Endoscopy;  Laterality: N/A;  8:30am  . ESOPHAGOGASTRODUODENOSCOPY N/A 01/01/2016   RMR: Multiple gastric and duodenal erosions likely NSAID related.  . GIVENS CAPSULE STUDY N/A 01/03/2016   Incomplete study, capsule never reach cecum. Scattered erosions and AVMs throughout the small bowel.  . JOINT REPLACEMENT    . POLYPECTOMY  11/28/2018   Procedure: POLYPECTOMY;  Surgeon: Rourk, Robert M, MD;  [facility];  Service: Endoscopy;;  ascending colon  . REPLACEMENT TOTAL KNEE Left   . SHOULDER ARTHROSCOPY WITH DISTAL CLAVICLE RESECTION Right 05/28/2015   Procedure: SHOULDER ARTHROSCOPY WITH DISTAL CLAVICLE RESECTION;  Surgeon: Peter Dalldorf, MD;  Location: [facility];  Service: Orthopedics;  Laterality: Right;  . SHOULDER ARTHROSCOPY WITH SUBACROMIAL DECOMPRESSION Right 05/28/2015   Procedure: SHOULDER ARTHROSCOPY WITH SUBACROMIAL DECOMPRESSION;  Surgeon: Peter Dalldorf, MD;  Location: [facility];  Service: Orthopedics;  Laterality: Right;  . THROAT SURGERY  1965    There were no vitals filed for this visit.  Subjective Assessment - 02/05/19 1259    Subjective  Pt states "I've been better." His neck and back have been   bothering him for the last week. He states that he was very sore following the needling but it did reduce the size of the knot significantly. He saw Dr. Vertell Limber yesterday who said to focus on the neck to help "reverse the curve". But otherwise Dr. Vertell Limber said that he can tell that he is better.    Pertinent History  Lt TKA, Rt shoulder arthroscopy    Limitations  Lifting;Sitting;Standing;House hold activities;Walking    Patient Stated Goals  pain relief    Currently in Pain?  Yes    Pain Score  3     Pain Location  Neck    Pain Orientation   Right;Mid    Pain Descriptors / Indicators  Aching;Dull    Pain Type  Chronic pain    Pain Onset  More than a month ago    Pain Frequency  Intermittent    Aggravating Factors   worse it gets is 9/10 - bending, stooping, leaning forward    Pain Relieving Factors  standing upright, leaning back, back supported    Effect of Pain on Daily Activities  increase            [facility] Adult PT Treatment/Exercise - 02/05/19 0001      Neck Exercises: Standing   Neck Retraction  15 reps    Neck Retraction Limitations  against wall to reduce back extension compensation    Other Standing Exercises  cervical retraction +UE flexion (trying to get thumbs to wall) x15 reps  (for cervical stability and thoracic extension)    Other Standing Exercises  lat pulldowns/lower trap activation GTB 2x15reps +cervical retraction      Neck Exercises: Sidelying   Other Sidelying Exercise  bil thoracic rotation hips 90/90 10x10"      Neck Exercises: Prone   Neck Retraction  15 reps    Neck Retraction Limitations  cues to reduce mid back/shoulder compensation      Manual Therapy   Manual Therapy  Soft tissue mobilization    Manual therapy comments  separate rest of treatment    Soft tissue mobilization  STM to R upper trap, levator scap, middle scap to reduce pain and restrictions      Neck Exercises: Stretches   Other Neck Stretches  pec stretch while on 1/2 foam roll x60 seconds            PT Education - 02/05/19 1259    Education Details  continue HEP    Person(s) Educated  Patient    Methods  Explanation;Demonstration    Comprehension  Verbalized understanding;Returned demonstration       PT Short Term Goals - 01/08/19 1304      PT SHORT TERM GOAL #1   Title   Patient will be independent with HEP, updated PRN, to reduce pain and improve mobility to improve QOL throughout his daily activities.    Time  2    Period  Weeks    Status  Achieved      PT SHORT TERM GOAL #2   Title  Patient will  demonstrate correct log roll technique to reduce strain on low back with supien to sit transfers.    Time  2    Period  Weeks    Status  Achieved        PT Long Term Goals - 01/08/19 1305      PT LONG TERM GOAL #1   Title  Patient will improve limited cervical and lumbar ROM to WFL's to demonstrate  improve joint mobility and flexibility, to reduce pain duirng funcitonal activities such as lifting groceries to put away, turnign head when driving, and walking dogs.     Time  4    Period  Weeks    Status  Partially Met      PT LONG TERM GOAL #2   Title  Patient will improve bil LE strength by 1 grade for each limited muscle group to demonstrate significant improvement in muscle activation and functional strengrh.     Time  4    Period  Weeks    Status  Partially Met      PT LONG TERM GOAL #3   Title  Patient will improve 5x sit to stand testing by 6 seconds or more to demonstrate significant improvement in LE strength, and improved performance of transitional movements. He will deny pain with transitional movement.    Baseline  1/15: improved by 3 sec to 23sec (was 26)    Time  4    Period  Weeks    Status  On-going      PT LONG TERM GOAL #4   Title  Patient will improve FOTO to 38% or less to indicated reduce self reported limitation related to low back pain with daily activities.     Baseline  1/15: 32% limited    Time  4    Period  Weeks    Status  Achieved            Plan - 02/05/19 1340    Clinical Impression Statement  Pt reporting increased soreness following needling last session and wished to not perform it again. Today's session focused on overall mobility, postural strength, and reducing restrictions. Min cues for form during therex to reduce upper trap compensations and cues required throughout session for proper cervical stabilization/posture. Ended with manual STM to address restrictions in levator scap/middle to upper trap. Pt reporting reduced pain at EOS.  Continue as planned.    Rehab Potential  Fair    PT Frequency  Biweekly    PT Duration  8 weeks   4 more sessions   PT Treatment/Interventions  ADLs/Self Care Home Management;Aquatic Therapy;Cryotherapy;Electrical Stimulation;Moist Heat;Traction;Functional mobility training;Therapeutic activities;Therapeutic exercise;Balance training;Neuromuscular re-education;Cognitive remediation;Patient/family education;Manual techniques;Passive range of motion    PT Next Visit Plan  continue postural strengthening and focus on cervical POC, Perform PA's to lumbar spine, grade 1 if with PT cervical ROM exercsies. Pt has reported pain relief with tilt table and may benefit from manual traction of lumbar and cervical spine.    PT Home Exercise Plan  Eval: lumbar extension at counter; 12/20: POE; 12/30: bridge and sidelying clam RTB; 1/2: LTRs    Consulted and Agree with Plan of Care  Patient       Patient will benefit from skilled therapeutic intervention in order to improve the following deficits and impairments:  Improper body mechanics, Pain, Postural dysfunction, Decreased mobility, Hypomobility, Decreased strength, Decreased range of motion, Decreased activity tolerance, Impaired flexibility, Decreased balance  Visit Diagnosis: Cervicalgia  Chronic midline low back pain without sciatica  Abnormal posture  Muscle weakness (generalized)     Problem List Patient Active Problem List   Diagnosis Date Noted  . Diverticulosis of colon without hemorrhage   . Solitary pulmonary nodule 02/10/2016  . Abnormal CT scan 02/02/2016  . Chronic GI bleeding 02/01/2016  . Anemia due to chronic blood loss 02/01/2016  . Colon polyps 01/04/2016  . 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   Gastric erosion with bleeding 01/02/2016  . Duodenal erosion 01/02/2016  . Tobacco abuse 01/01/2016  . Essential hypertension 01/01/2016  . Mucosal abnormality of stomach   . GI bleed  12/31/2015  . Diabetes mellitus without complication ([facility])   . Hyperlipidemia   . BPH (benign prostatic hyperplasia)   . Carotid artery disease ([facility])   . OSA on CPAP   . COPD (chronic obstructive pulmonary disease) ([facility])          PT, DPT   [facility] [address] [place], [place], [address] Phone: [phone]   Fax:  [phone]  Name: Todd George MRN: [id number] Date of Birth: 06/24/1941   

## 2019-02-19 ENCOUNTER — Ambulatory Visit (HOSPITAL_COMMUNITY): Payer: Medicare HMO

## 2019-02-24 DIAGNOSIS — H35371 Puckering of macula, right eye: Secondary | ICD-10-CM | POA: Diagnosis not present

## 2019-02-24 DIAGNOSIS — H353124 Nonexudative age-related macular degeneration, left eye, advanced atrophic with subfoveal involvement: Secondary | ICD-10-CM | POA: Diagnosis not present

## 2019-02-24 DIAGNOSIS — H43822 Vitreomacular adhesion, left eye: Secondary | ICD-10-CM | POA: Diagnosis not present

## 2019-02-24 DIAGNOSIS — H353211 Exudative age-related macular degeneration, right eye, with active choroidal neovascularization: Secondary | ICD-10-CM | POA: Diagnosis not present

## 2019-02-26 DIAGNOSIS — M130 Polyarthritis, unspecified: Secondary | ICD-10-CM | POA: Diagnosis not present

## 2019-02-26 DIAGNOSIS — M545 Low back pain: Secondary | ICD-10-CM | POA: Diagnosis not present

## 2019-02-26 DIAGNOSIS — M25561 Pain in right knee: Secondary | ICD-10-CM | POA: Diagnosis not present

## 2019-02-26 DIAGNOSIS — M5090 Cervical disc disorder, unspecified, unspecified cervical region: Secondary | ICD-10-CM | POA: Diagnosis not present

## 2019-03-05 ENCOUNTER — Telehealth (HOSPITAL_COMMUNITY): Payer: Self-pay | Admitting: Internal Medicine

## 2019-03-05 ENCOUNTER — Ambulatory Visit (HOSPITAL_COMMUNITY): Payer: Medicare HMO | Admitting: Physical Therapy

## 2019-03-05 NOTE — Telephone Encounter (Signed)
3/11   8:18am  pt chose "2" on phone tree to cx... I left a message to confirm this.Marland KitchenMarland Kitchen

## 2019-03-06 DIAGNOSIS — I781 Nevus, non-neoplastic: Secondary | ICD-10-CM | POA: Diagnosis not present

## 2019-03-06 DIAGNOSIS — D1801 Hemangioma of skin and subcutaneous tissue: Secondary | ICD-10-CM | POA: Diagnosis not present

## 2019-03-06 DIAGNOSIS — D225 Melanocytic nevi of trunk: Secondary | ICD-10-CM | POA: Diagnosis not present

## 2019-03-06 DIAGNOSIS — Z1283 Encounter for screening for malignant neoplasm of skin: Secondary | ICD-10-CM | POA: Diagnosis not present

## 2019-03-06 DIAGNOSIS — B078 Other viral warts: Secondary | ICD-10-CM | POA: Diagnosis not present

## 2019-03-14 ENCOUNTER — Telehealth (HOSPITAL_COMMUNITY): Payer: Self-pay

## 2019-03-14 ENCOUNTER — Encounter (HOSPITAL_COMMUNITY): Payer: Self-pay

## 2019-03-14 NOTE — Telephone Encounter (Signed)
Called and informed pt that the clinic would be closed for 2 weeks due to COVID-19. Asked if he wanted to reschedule him for once our clinic reopened and pt stated no, that he wanted to be d/c because he was happy with his current functional status.   Geraldine Solar PT, DPT

## 2019-03-14 NOTE — Therapy (Signed)
[place], [place], [address] Phone: [phone]   Fax:  [phone]  Patient Details  Name: NYRON MOZER MRN: [id number] Date of Birth: [birth year] Referring Provider:  No ref. provider found  Encounter Date: 03/14/2019  PHYSICAL THERAPY DISCHARGE SUMMARY  Visits from Start of Care: 9  Current functional level related to goals / functional outcomes: See last note   Remaining deficits: See last note   Education / Equipment: n/a  Plan: Patient agrees to discharge.  Patient goals were partially met. Patient is being discharged due to being pleased with the current functional level.  ?????     Geraldine Solar PT, [place] [address] [place], [place], [address] Phone: [phone]   Fax:  [phone]

## 2019-03-19 ENCOUNTER — Ambulatory Visit (HOSPITAL_COMMUNITY): Payer: Medicare HMO

## 2019-03-27 DIAGNOSIS — Z7984 Long term (current) use of oral hypoglycemic drugs: Secondary | ICD-10-CM | POA: Diagnosis not present

## 2019-03-27 DIAGNOSIS — I10 Essential (primary) hypertension: Secondary | ICD-10-CM | POA: Diagnosis not present

## 2019-03-27 DIAGNOSIS — E785 Hyperlipidemia, unspecified: Secondary | ICD-10-CM | POA: Diagnosis not present

## 2019-03-27 DIAGNOSIS — J449 Chronic obstructive pulmonary disease, unspecified: Secondary | ICD-10-CM | POA: Diagnosis not present

## 2019-03-27 DIAGNOSIS — Z79899 Other long term (current) drug therapy: Secondary | ICD-10-CM | POA: Diagnosis not present

## 2019-03-27 DIAGNOSIS — E119 Type 2 diabetes mellitus without complications: Secondary | ICD-10-CM | POA: Diagnosis not present

## 2019-03-28 DIAGNOSIS — E785 Hyperlipidemia, unspecified: Secondary | ICD-10-CM | POA: Diagnosis not present

## 2019-03-28 DIAGNOSIS — E119 Type 2 diabetes mellitus without complications: Secondary | ICD-10-CM | POA: Diagnosis not present

## 2019-03-28 DIAGNOSIS — I10 Essential (primary) hypertension: Secondary | ICD-10-CM | POA: Diagnosis not present

## 2019-03-31 DIAGNOSIS — H353211 Exudative age-related macular degeneration, right eye, with active choroidal neovascularization: Secondary | ICD-10-CM | POA: Diagnosis not present

## 2019-04-24 DIAGNOSIS — F17210 Nicotine dependence, cigarettes, uncomplicated: Secondary | ICD-10-CM | POA: Diagnosis not present

## 2019-04-24 DIAGNOSIS — J309 Allergic rhinitis, unspecified: Secondary | ICD-10-CM | POA: Diagnosis not present

## 2019-04-28 DIAGNOSIS — M130 Polyarthritis, unspecified: Secondary | ICD-10-CM | POA: Diagnosis not present

## 2019-04-28 DIAGNOSIS — M5090 Cervical disc disorder, unspecified, unspecified cervical region: Secondary | ICD-10-CM | POA: Diagnosis not present

## 2019-04-28 DIAGNOSIS — M545 Low back pain: Secondary | ICD-10-CM | POA: Diagnosis not present

## 2019-04-28 DIAGNOSIS — M25561 Pain in right knee: Secondary | ICD-10-CM | POA: Diagnosis not present

## 2019-05-05 DIAGNOSIS — H353231 Exudative age-related macular degeneration, bilateral, with active choroidal neovascularization: Secondary | ICD-10-CM | POA: Diagnosis not present

## 2019-05-23 DIAGNOSIS — M545 Low back pain: Secondary | ICD-10-CM | POA: Diagnosis not present

## 2019-05-23 DIAGNOSIS — M130 Polyarthritis, unspecified: Secondary | ICD-10-CM | POA: Diagnosis not present

## 2019-05-23 DIAGNOSIS — M5090 Cervical disc disorder, unspecified, unspecified cervical region: Secondary | ICD-10-CM | POA: Diagnosis not present

## 2019-05-23 DIAGNOSIS — M25561 Pain in right knee: Secondary | ICD-10-CM | POA: Diagnosis not present

## 2019-06-02 DIAGNOSIS — H353231 Exudative age-related macular degeneration, bilateral, with active choroidal neovascularization: Secondary | ICD-10-CM | POA: Diagnosis not present

## 2019-07-07 DIAGNOSIS — H353231 Exudative age-related macular degeneration, bilateral, with active choroidal neovascularization: Secondary | ICD-10-CM | POA: Diagnosis not present

## 2019-07-07 DIAGNOSIS — M79641 Pain in right hand: Secondary | ICD-10-CM | POA: Diagnosis not present

## 2019-07-07 DIAGNOSIS — M130 Polyarthritis, unspecified: Secondary | ICD-10-CM | POA: Diagnosis not present

## 2019-07-07 DIAGNOSIS — M25561 Pain in right knee: Secondary | ICD-10-CM | POA: Diagnosis not present

## 2019-07-07 DIAGNOSIS — M545 Low back pain: Secondary | ICD-10-CM | POA: Diagnosis not present

## 2019-07-07 DIAGNOSIS — H43813 Vitreous degeneration, bilateral: Secondary | ICD-10-CM | POA: Diagnosis not present

## 2019-07-07 DIAGNOSIS — H35423 Microcystoid degeneration of retina, bilateral: Secondary | ICD-10-CM | POA: Diagnosis not present

## 2019-07-07 DIAGNOSIS — H35371 Puckering of macula, right eye: Secondary | ICD-10-CM | POA: Diagnosis not present

## 2019-07-14 DIAGNOSIS — E119 Type 2 diabetes mellitus without complications: Secondary | ICD-10-CM | POA: Diagnosis not present

## 2019-07-14 DIAGNOSIS — F17210 Nicotine dependence, cigarettes, uncomplicated: Secondary | ICD-10-CM | POA: Diagnosis not present

## 2019-07-14 DIAGNOSIS — I10 Essential (primary) hypertension: Secondary | ICD-10-CM | POA: Diagnosis not present

## 2019-07-14 DIAGNOSIS — E782 Mixed hyperlipidemia: Secondary | ICD-10-CM | POA: Diagnosis not present

## 2019-07-14 DIAGNOSIS — M419 Scoliosis, unspecified: Secondary | ICD-10-CM | POA: Diagnosis not present

## 2019-07-14 DIAGNOSIS — H3530 Unspecified macular degeneration: Secondary | ICD-10-CM | POA: Diagnosis not present

## 2019-07-21 DIAGNOSIS — I10 Essential (primary) hypertension: Secondary | ICD-10-CM | POA: Diagnosis not present

## 2019-07-21 DIAGNOSIS — E782 Mixed hyperlipidemia: Secondary | ICD-10-CM | POA: Diagnosis not present

## 2019-07-21 DIAGNOSIS — E119 Type 2 diabetes mellitus without complications: Secondary | ICD-10-CM | POA: Diagnosis not present

## 2019-07-22 DIAGNOSIS — M65351 Trigger finger, right little finger: Secondary | ICD-10-CM | POA: Diagnosis not present

## 2019-07-22 DIAGNOSIS — M79641 Pain in right hand: Secondary | ICD-10-CM | POA: Diagnosis not present

## 2019-07-24 DIAGNOSIS — E1169 Type 2 diabetes mellitus with other specified complication: Secondary | ICD-10-CM | POA: Diagnosis not present

## 2019-07-24 DIAGNOSIS — F17210 Nicotine dependence, cigarettes, uncomplicated: Secondary | ICD-10-CM | POA: Diagnosis not present

## 2019-07-24 DIAGNOSIS — M419 Scoliosis, unspecified: Secondary | ICD-10-CM | POA: Diagnosis not present

## 2019-07-24 DIAGNOSIS — E782 Mixed hyperlipidemia: Secondary | ICD-10-CM | POA: Diagnosis not present

## 2019-07-24 DIAGNOSIS — H3530 Unspecified macular degeneration: Secondary | ICD-10-CM | POA: Diagnosis not present

## 2019-07-24 DIAGNOSIS — I10 Essential (primary) hypertension: Secondary | ICD-10-CM | POA: Diagnosis not present

## 2019-08-07 DIAGNOSIS — H353211 Exudative age-related macular degeneration, right eye, with active choroidal neovascularization: Secondary | ICD-10-CM | POA: Diagnosis not present

## 2019-08-07 DIAGNOSIS — H353231 Exudative age-related macular degeneration, bilateral, with active choroidal neovascularization: Secondary | ICD-10-CM | POA: Diagnosis not present

## 2019-08-25 DIAGNOSIS — K047 Periapical abscess without sinus: Secondary | ICD-10-CM | POA: Diagnosis not present

## 2019-08-25 DIAGNOSIS — J019 Acute sinusitis, unspecified: Secondary | ICD-10-CM | POA: Diagnosis not present

## 2019-09-11 DIAGNOSIS — H353231 Exudative age-related macular degeneration, bilateral, with active choroidal neovascularization: Secondary | ICD-10-CM | POA: Diagnosis not present

## 2019-09-19 ENCOUNTER — Ambulatory Visit (HOSPITAL_COMMUNITY): Payer: Medicare HMO

## 2019-09-19 ENCOUNTER — Encounter (HOSPITAL_COMMUNITY): Payer: Self-pay

## 2019-09-24 ENCOUNTER — Telehealth: Payer: Self-pay | Admitting: Internal Medicine

## 2019-09-24 NOTE — Telephone Encounter (Signed)
Patient came in office inquiring about having another tcs-the letter from his last stated he did not need another-he wanted to make sure that was correct, I told him I would have the nurse check behind me

## 2019-09-25 ENCOUNTER — Other Ambulatory Visit: Payer: Self-pay

## 2019-09-25 ENCOUNTER — Ambulatory Visit (HOSPITAL_COMMUNITY): Payer: Medicare HMO | Attending: Internal Medicine

## 2019-09-25 ENCOUNTER — Encounter (HOSPITAL_COMMUNITY): Payer: Self-pay

## 2019-09-25 DIAGNOSIS — R2689 Other abnormalities of gait and mobility: Secondary | ICD-10-CM | POA: Insufficient documentation

## 2019-09-25 DIAGNOSIS — R2681 Unsteadiness on feet: Secondary | ICD-10-CM | POA: Diagnosis not present

## 2019-09-25 DIAGNOSIS — M6281 Muscle weakness (generalized): Secondary | ICD-10-CM | POA: Diagnosis not present

## 2019-09-25 NOTE — Therapy (Signed)
[place], [place], [address] Phone: [phone]   Fax:  [phone]  Physical Therapy Evaluation  Patient Details  Name: Todd George MRN: [id number] Date of Birth: 25-Dec-1941 Referring Provider (PT): Allyn Kenner, MD   Encounter Date: 09/25/2019  PT End of Session - 09/25/19 1515    Visit Number  1    Number of Visits  8    Date for PT Re-Evaluation  11/20/19    Authorization Type  [employer]    Authorization Time Period  09/25/19 to 11/21/19    PT Start Time  1425    PT Stop Time  1510    PT Time Calculation (min)  45 min    Activity Tolerance  Patient tolerated treatment well    Behavior During Therapy  [facility] for tasks assessed/performed       Past Medical History:  Diagnosis Date  . Arthritis   . 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 BPH (benign prostatic hyperplasia)   . Cancer ([place])    skin cancer  . Carotid artery disease ([place])   . Cellulitis   . Chronic back pain   . Colon polyps 01/04/2016  . COPD (chronic obstructive pulmonary disease) ([place])    2ppd  . Diabetes mellitus without complication ([place])   . Duodenal erosion 01/02/2016  . Gastric erosion with bleeding 01/02/2016  . Hyperlipidemia   . Hypertension   . OSA on CPAP    not used machine in over 10 yrs    Past Surgical History:  Procedure Laterality Date  . CHOLECYSTECTOMY    . COLONOSCOPY N/A 01/03/2016   RMR: Active bleeding seen arising from the small bowel. Multiple cecal/ascending colon AVMs ablated. 1.5 cm carpet polyp in the ascending segment status post piecemeal snare polypectomy and tattooing. Innocent appearing colonic diverticulosis.  . COLONOSCOPY N/A 06/14/2016   Procedure: COLONOSCOPY;  Surgeon: Daneil Dolin, MD;  [facility];  Service: Endoscopy;  Laterality: N/A;  1200  . COLONOSCOPY WITH PROPOFOL N/A 11/28/2018   Procedure: COLONOSCOPY WITH PROPOFOL;  Surgeon: Daneil Dolin, MD;  [facility];  Service: Endoscopy;  Laterality: N/A;  8:30am  . ESOPHAGOGASTRODUODENOSCOPY N/A 01/01/2016   RMR: Multiple gastric and duodenal erosions likely NSAID related.  Marland Kitchen GIVENS CAPSULE STUDY N/A 01/03/2016   Incomplete study, capsule never reach cecum. Scattered erosions and AVMs throughout the small bowel.  Marland Kitchen JOINT REPLACEMENT    . POLYPECTOMY  11/28/2018   Procedure: POLYPECTOMY;  Surgeon: Daneil Dolin, MD;  [facility];  Service: Endoscopy;;  ascending colon  . REPLACEMENT TOTAL KNEE Left   . SHOULDER ARTHROSCOPY WITH DISTAL CLAVICLE RESECTION Right 05/28/2015   Procedure: SHOULDER ARTHROSCOPY WITH DISTAL CLAVICLE RESECTION;  Surgeon: Melrose Nakayama, MD;  Location: [place];  Service: Orthopedics;  Laterality: Right;  . SHOULDER ARTHROSCOPY WITH SUBACROMIAL DECOMPRESSION Right 05/28/2015   Procedure: SHOULDER ARTHROSCOPY WITH SUBACROMIAL DECOMPRESSION;  Surgeon: Melrose Nakayama, MD;  Location: [place];  Service: Orthopedics;  Laterality: Right;  . THROAT SURGERY  1965    There were no vitals filed for this visit.   Subjective Assessment - 09/25/19 1430    Subjective  Pt reports walking and occasionally loses balance and takes a few steps to catch his balance. Pt denies SPC, RW use. Pt ambualtes 1 mile for daily walking program. Pt denies any falls in last year. Pt reports 7 steps to  enter the home with bil handrail, with occasional difficulty due to steps being 5-6" tall. Pt reports chronic neck pain, LBP with poor posture that both improve with exercise. Pt reports has 2 dogs at home that get in between his feet, but never has fallen over them.    Limitations  Walking    How long can you sit comfortably?  no issues    How long can you stand comfortably?  no issues    How long can you walk comfortably?  no issues    Patient Stated Goals  quit stumbling around    Currently in Pain?  No/denies   chronic neck pain         [facility] PT Assessment - 09/25/19 0001      Assessment   Medical Diagnosis  Balance deficits    Referring Provider (PT)  Allyn Kenner, MD    Onset Date/Surgical Date  --   2-3 months   Next MD Visit  none scheduled    Prior Therapy  Not for current issue      Precautions   Precautions  None      Restrictions   Weight Bearing Restrictions  No      Balance Screen   Has the patient fallen in the past 6 months  No    Has the patient had a decrease in activity level because of a fear of falling?   Yes    Is the patient reluctant to leave their home because of a fear of falling?   No      Home Social worker  Private residence    Living Arrangements  Alone    Additional Comments  7 steps to enter with bil handrails      Prior Function   Level of Independence  Independent    Risk manager work    Biomedical scientist  standing, walking    Leisure  play on the computer, walking program      Cognition   Overall Cognitive Status  Within Functional Limits for tasks assessed      Observation/Other Assessments   Focus on Therapeutic Outcomes (FOTO)   23% limited      Functional Tests   Functional tests  Sit to Stand      Sit to Stand   Comments  5xSTS: 15 sec, from chair, UE assits from knees      ROM / Strength   AROM / PROM / Strength  Strength      Strength   Strength Assessment Site  Hip;Knee;Ankle    Right Hip Flexion  4+/5    Right Hip Extension  3+/5    Right Hip ABduction  4+/5    Left Hip Flexion  4+/5    Left Hip Extension  3+/5    Left Hip ABduction  4+/5    Right Knee Flexion  4/5    Right Knee Extension  4+/5    Left Knee Flexion  4/5    Left Knee Extension  5/5    Right Ankle Dorsiflexion  5/5    Left Ankle Dorsiflexion  5/5      Balance   Balance Assessed  Yes      Static Standing Balance   Static Standing - Balance Support  No upper extremity supported    Static Standing Balance -  Activities   Single Leg Stance - Right  Leg;Single Leg Stance - Left Leg;Tandam Stance - Right Leg;Tandam Stance - Left Leg  Static Standing - Comment/# of Minutes  R: 11 sec or <, L: 6.5 sec or <      Standardized Balance Assessment   Standardized Balance Assessment  Dynamic Gait Index      Dynamic Gait Index   Level Surface  Mild Impairment    Change in Gait Speed  Mild Impairment    Gait with Horizontal Head Turns  Mild Impairment    Gait with Vertical Head Turns  Moderate Impairment    Gait and Pivot Turn  Mild Impairment    Step Over Obstacle  Mild Impairment    Step Around Obstacles  Mild Impairment    Steps  Normal    Total Score  16          Objective measurements completed on examination: See above findings.      PT Education - 09/25/19 1435    Education Details  Assessment findings, FOTO score, POC, intiaited HEP    Person(s) Educated  Patient    Methods  Explanation;Demonstration;Handout    Comprehension  Verbalized understanding;Returned demonstration       PT Short Term Goals - 09/25/19 1516      PT SHORT TERM GOAL #1   Title  To will be independent with HEP and perform 3x/week to improve BLE strength and reduce risk for falls.    Time  2    Period  Weeks    Status  New    Target Date  10/09/19      PT SHORT TERM GOAL #2   Title  Pt will perform 5x STS in 13 sec or < to demo improved functional strength with transfers.    Time  2    Period  Weeks    Status  New        PT Long Term Goals - 09/25/19 1516      PT LONG TERM GOAL #1   Title  Pt will ambulate 0.8 m/sec to improve safety and ability to ambulate community distances.    Time  4    Period  Weeks    Status  New    Target Date  10/23/19      PT LONG TERM GOAL #2   Title  Pt will improve BLE strength by  MMT grade to improve gait mechanics and transfer abilities.    Time  4    Period  Weeks    Status  New      PT LONG TERM GOAL #3   Title  Pt will perform SLS for 15 sec bilaterally to decrease risk for falls.    Time   4    Period  Weeks    Status  New      PT LONG TERM GOAL #4   Title  Pt will score >19 on DGI to reduce risk for falls with walking program.    Time  4    Period  Weeks    Status  New             Plan - 09/25/19 1523    Clinical Impression Statement  Pt is a pleasant 78YO male reporting to therapy with balance deficits. Subjectively pt with increased unsteadiness over the past 2-3 months, but without falls. Objectively, pt demonstrates weakness throughout bil hips and knees per MMT. Functionally, pt requires increased time with transfers and demonstrates unsteadiness with gait increasing risk for falls. Per DGI, pt scored <19 indicating increased risk for falls. Pt with mild unsteadiness performing SLS  activities, requiring multiple attempts but able to maintain R SLS for 11 seconds at best and L SLS for 6.5 seconds at best. Pt would benefit form skilled PT interventions to improve strength, balance, activity tolerance, and overall safety with functional mobility to decrease risk for falls.    Personal Factors and Comorbidities  Age;Comorbidity 3+    Comorbidities  HTN, diabetes, COPD, LBP, arthritis    Examination-Activity Limitations  Locomotion Level    Examination-Participation Restrictions  Yard Work    Stability/Clinical Decision Making  Stable/Uncomplicated    Clinical Decision Making  Low    Rehab Potential  Good    PT Frequency  2x / week    PT Duration  4 weeks    PT Treatment/Interventions  ADLs/Self Care Home Management;Aquatic Therapy;DME Instruction;Gait training;Stair training;Functional mobility training;Therapeutic activities;Therapeutic exercise;Balance training;Neuromuscular re-education;Patient/family education;Orthotic Fit/Training;Manual techniques;Passive range of motion;Dry needling;Taping;Joint Manipulations    PT Next Visit Plan  Calculate gait speed. High level static and dynamic balance activities. Strengthening of glutes. Monitor chronic LBP with exercises.     PT Home Exercise Plan  Eval: tandem stance, vector stance with 3 sec holds (fwd/lateral/back)    Consulted and Agree with Plan of Care  Patient       Patient will benefit from skilled therapeutic intervention in order to improve the following deficits and impairments:  Abnormal gait, Cardiopulmonary status limiting activity, Decreased balance, Decreased strength, Difficulty walking, Impaired perceived functional ability, Pain  Visit Diagnosis: Unsteadiness on feet  Other abnormalities of gait and mobility  Muscle weakness (generalized)     Problem List Patient Active Problem List   Diagnosis Date Noted  . Diverticulosis of colon without hemorrhage   . Solitary pulmonary nodule 02/10/2016  . Abnormal CT scan 02/02/2016  . Chronic GI bleeding 02/01/2016  . Anemia due to chronic blood loss 02/01/2016  . Colon polyps 01/04/2016  . 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 Gastric erosion with bleeding 01/02/2016  . Duodenal erosion 01/02/2016  . Tobacco abuse 01/01/2016  . Essential hypertension 01/01/2016  . Mucosal abnormality of stomach   . GI bleed 12/31/2015  . Diabetes mellitus without complication ([place])   . Hyperlipidemia   . BPH (benign prostatic hyperplasia)   . Carotid artery disease ([place])   . OSA on CPAP   . COPD (chronic obstructive pulmonary disease) ([place])      Tori Weston Fulco PT, DPT 09/25/19, 3:28 PM [place], [place], [address] Phone: [phone]   Fax:  [phone]  Name: Todd George MRN: [id number] Date of Birth: December 29, 1940

## 2019-09-26 NOTE — Telephone Encounter (Signed)
I cc'ed Dr Nevada Crane the letter RMR sent patient regarding his last colonoscopy and recommendations.

## 2019-09-26 NOTE — Telephone Encounter (Signed)
Spoke with pt. Pt is aware that letter was mailed 11/2018. Pt doesn't need another TCS unless new symptoms develop.   Pt's new PCP is Control and instrumentation engineer, Nevada Crane. Pt would like to make sure Dr. Nevada Crane gets a copy that he doesn't need another TCS.

## 2019-09-29 ENCOUNTER — Ambulatory Visit (HOSPITAL_COMMUNITY): Payer: Medicare HMO | Admitting: Physical Therapy

## 2019-09-29 ENCOUNTER — Other Ambulatory Visit: Payer: Self-pay

## 2019-09-29 DIAGNOSIS — M6281 Muscle weakness (generalized): Secondary | ICD-10-CM

## 2019-09-29 DIAGNOSIS — R2689 Other abnormalities of gait and mobility: Secondary | ICD-10-CM | POA: Diagnosis not present

## 2019-09-29 DIAGNOSIS — R2681 Unsteadiness on feet: Secondary | ICD-10-CM | POA: Diagnosis not present

## 2019-09-29 NOTE — Therapy (Signed)
[place], [place], [address] Phone: [phone]   Fax:  [phone]  Physical Therapy Treatment  Patient Details  Name: Todd George MRN: [id number] Date of Birth: 21-Jun-1941 Referring Provider (PT): Allyn Kenner, MD   Encounter Date: 09/29/2019  PT End of Session - 09/29/19 1645    Visit Number  2    Number of Visits  8    Date for PT Re-Evaluation  11/20/19    Authorization Type  [employer]    Authorization Time Period  09/25/19 to 11/21/19    PT Start Time  1320    PT Stop Time  1400    PT Time Calculation (min)  40 min    Activity Tolerance  Patient tolerated treatment well    Behavior During Therapy  [facility] for tasks assessed/performed       Past Medical History:  Diagnosis Date  . Arthritis   . 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 BPH (benign prostatic hyperplasia)   . Cancer ([place])    skin cancer  . Carotid artery disease ([place])   . Cellulitis   . Chronic back pain   . Colon polyps 01/04/2016  . COPD (chronic obstructive pulmonary disease) ([place])    2ppd  . Diabetes mellitus without complication ([place])   . Duodenal erosion 01/02/2016  . Gastric erosion with bleeding 01/02/2016  . Hyperlipidemia   . Hypertension   . OSA on CPAP    not used machine in over 10 yrs    Past Surgical History:  Procedure Laterality Date  . CHOLECYSTECTOMY    . COLONOSCOPY N/A 01/03/2016   RMR: Active bleeding seen arising from the small bowel. Multiple cecal/ascending colon AVMs ablated. 1.5 cm carpet polyp in the ascending segment status post piecemeal snare polypectomy and tattooing. Innocent appearing colonic diverticulosis.  . COLONOSCOPY N/A 06/14/2016   Procedure: COLONOSCOPY;  Surgeon: Daneil Dolin, MD;  [facility];  Service: Endoscopy;  Laterality: N/A;  1200  . COLONOSCOPY WITH PROPOFOL N/A 11/28/2018   Procedure: COLONOSCOPY WITH PROPOFOL;  Surgeon: Daneil Dolin, MD;  [facility];  Service: Endoscopy;  Laterality: N/A;  8:30am  . ESOPHAGOGASTRODUODENOSCOPY N/A 01/01/2016   RMR: Multiple gastric and duodenal erosions likely NSAID related.  Marland Kitchen GIVENS CAPSULE STUDY N/A 01/03/2016   Incomplete study, capsule never reach cecum. Scattered erosions and AVMs throughout the small bowel.  Marland Kitchen JOINT REPLACEMENT    . POLYPECTOMY  11/28/2018   Procedure: POLYPECTOMY;  Surgeon: Daneil Dolin, MD;  [facility];  Service: Endoscopy;;  ascending colon  . REPLACEMENT TOTAL KNEE Left   . SHOULDER ARTHROSCOPY WITH DISTAL CLAVICLE RESECTION Right 05/28/2015   Procedure: SHOULDER ARTHROSCOPY WITH DISTAL CLAVICLE RESECTION;  Surgeon: Melrose Nakayama, MD;  Location: [place];  Service: Orthopedics;  Laterality: Right;  . SHOULDER ARTHROSCOPY WITH SUBACROMIAL DECOMPRESSION Right 05/28/2015   Procedure: SHOULDER ARTHROSCOPY WITH SUBACROMIAL DECOMPRESSION;  Surgeon: Melrose Nakayama, MD;  Location: [place];  Service: Orthopedics;  Laterality: Right;  . THROAT SURGERY  1965    There were no vitals filed for this visit.  Subjective Assessment - 09/29/19 1325    Subjective  pt states hes been working on his exercises.  States tandem is not that bad but SLS is very challenging and unable to even stand on the Lt LE wihtout his UE's    Currently in Pain?  No/denies  [facility] PT Assessment - 09/29/19 0001      Ambulation/Gait   Gait velocity  3MWT 565 feet velocity 0.96 m/sec                   [facility] Adult PT Treatment/Exercise - 09/29/19 0001      Knee/Hip Exercises: Standing   Heel Raises  Both;10 reps    Hip Abduction  Both;10 reps    Hip Extension  Both;10 reps    SLS  bilaterally max of 12" Lt, 14" Rt    SLS with Vectors  both 5X3" each LE    Other Standing Knee Exercises  tandem stance 30" each LE lead      Knee/Hip Exercises: Seated   Sit to Sand  10 reps;with UE support             PT  Education - 09/29/19 1642    Education Details  reviewed goals, HEP and POC moving forward.    Person(s) Educated  Patient    Methods  Explanation;Demonstration;Tactile cues;Verbal cues    Comprehension  Verbalized understanding;Returned demonstration;Verbal cues required;Tactile cues required       PT Short Term Goals - 09/29/19 1327      PT SHORT TERM GOAL #1   Title  To will be independent with HEP and perform 3x/week to improve BLE strength and reduce risk for falls.    Time  2    Period  Weeks    Status  On-going    Target Date  10/09/19      PT SHORT TERM GOAL #2   Title  Pt will perform 5x STS in 13 sec or < to demo improved functional strength with transfers.    Time  2    Period  Weeks    Status  On-going        PT Long Term Goals - 09/29/19 1329      PT LONG TERM GOAL #1   Title  Pt will ambulate 0.8 m/sec to improve safety and ability to ambulate community distances.    Time  4    Period  Weeks    Status  On-going      PT LONG TERM GOAL #2   Title  Pt will improve BLE strength by  MMT grade to improve gait mechanics and transfer abilities.    Time  4    Period  Weeks    Status  On-going      PT LONG TERM GOAL #3   Title  Pt will perform SLS for 15 sec bilaterally to decrease risk for falls.    Time  4    Period  Weeks    Status  On-going      PT LONG TERM GOAL #4   Title  Pt will score >19 on DGI to reduce risk for falls with walking program.    Time  4    Period  Weeks    Status  On-going            Plan - 09/29/19 1647    Clinical Impression Statement  Reviewed goals and POC moving forward.  pt able to complete all exercises with good stability and ability to self correct LOB.  3 MWT completed to calculate gait velocity with resultant speed at 0.60m/sec.  Pt with most challenge maintaining SLS. Able to complete sit to stands without UE assist as well.  No new exercises added to HEP this session.    Personal Factors and  Comorbidities   Age;Comorbidity 3+    Comorbidities  HTN, diabetes, COPD, LBP, arthritis    Examination-Activity Limitations  Locomotion Level    Examination-Participation Restrictions  Yard Work    Stability/Clinical Decision Making  Stable/Uncomplicated    Rehab Potential  Good    PT Frequency  2x / week    PT Duration  4 weeks    PT Treatment/Interventions  ADLs/Self Care Home Management;Aquatic Therapy;DME Instruction;Gait training;Stair training;Functional mobility training;Therapeutic activities;Therapeutic exercise;Balance training;Neuromuscular re-education;Patient/family education;Orthotic Fit/Training;Manual techniques;Passive range of motion;Dry needling;Taping;Joint Manipulations    PT Next Visit Plan  Calculate gait speed. High level static and dynamic balance activities. Strengthening of glutes. Monitor chronic LBP with exercises.    PT Home Exercise Plan  Eval: tandem stance, vector stance with 3 sec holds (fwd/lateral/back)    Consulted and Agree with Plan of Care  Patient       Patient will benefit from skilled therapeutic intervention in order to improve the following deficits and impairments:  Abnormal gait, Cardiopulmonary status limiting activity, Decreased balance, Decreased strength, Difficulty walking, Impaired perceived functional ability, Pain  Visit Diagnosis: Unsteadiness on feet  Other abnormalities of gait and mobility  Muscle weakness (generalized)     Problem List Patient Active Problem List   Diagnosis Date Noted  . Diverticulosis of colon without hemorrhage   . Solitary pulmonary nodule 02/10/2016  . Abnormal CT scan 02/02/2016  . Chronic GI bleeding 02/01/2016  . Anemia due to chronic blood loss 02/01/2016  . Colon polyps 01/04/2016  . 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 Gastric erosion with bleeding 01/02/2016  . Duodenal erosion 01/02/2016  . Tobacco abuse 01/01/2016  . Essential hypertension 01/01/2016  . Mucosal  abnormality of stomach   . GI bleed 12/31/2015  . Diabetes mellitus without complication ([place])   . Hyperlipidemia   . BPH (benign prostatic hyperplasia)   . Carotid artery disease ([place])   . OSA on CPAP   . COPD (chronic obstructive pulmonary disease) [facility])    Teena Irani, PTA/CLT [phone]  Teena Irani 09/29/2019, 4:53 PM  [place] [address] [place], [place], [address] Phone: [phone]   Fax:  [phone]  Name: Todd George MRN: [id number] Date of Birth: [birth year]

## 2019-10-01 ENCOUNTER — Encounter (HOSPITAL_COMMUNITY): Payer: Self-pay

## 2019-10-01 ENCOUNTER — Other Ambulatory Visit: Payer: Self-pay

## 2019-10-01 ENCOUNTER — Ambulatory Visit (HOSPITAL_COMMUNITY): Payer: Medicare HMO

## 2019-10-01 DIAGNOSIS — R2681 Unsteadiness on feet: Secondary | ICD-10-CM | POA: Diagnosis not present

## 2019-10-01 DIAGNOSIS — M6281 Muscle weakness (generalized): Secondary | ICD-10-CM | POA: Diagnosis not present

## 2019-10-01 DIAGNOSIS — R2689 Other abnormalities of gait and mobility: Secondary | ICD-10-CM

## 2019-10-01 NOTE — Therapy (Signed)
[place], [place], [address] Phone: [phone]   Fax:  [phone]  Physical Therapy Treatment  Patient Details  Name: Todd George MRN: [id number] Date of Birth: 1941-05-03 Referring Provider (PT): Allyn Kenner, MD   Encounter Date: 10/01/2019  PT End of Session - 10/01/19 1428    Visit Number  3    Number of Visits  8    Date for PT Re-Evaluation  11/20/19    Authorization Type  [employer]    Authorization Time Period  09/25/19 to 11/21/19    PT Start Time  1430    PT Stop Time  1510    PT Time Calculation (min)  40 min    Equipment Utilized During Treatment  Gait belt    Activity Tolerance  Patient tolerated treatment well    Behavior During Therapy  [facility] for tasks assessed/performed       Past Medical History:  Diagnosis Date  . Arthritis   . 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 BPH (benign prostatic hyperplasia)   . Cancer ([place])    skin cancer  . Carotid artery disease ([place])   . Cellulitis   . Chronic back pain   . Colon polyps 01/04/2016  . COPD (chronic obstructive pulmonary disease) ([place])    2ppd  . Diabetes mellitus without complication ([place])   . Duodenal erosion 01/02/2016  . Gastric erosion with bleeding 01/02/2016  . Hyperlipidemia   . Hypertension   . OSA on CPAP    not used machine in over 10 yrs    Past Surgical History:  Procedure Laterality Date  . CHOLECYSTECTOMY    . COLONOSCOPY N/A 01/03/2016   RMR: Active bleeding seen arising from the small bowel. Multiple cecal/ascending colon AVMs ablated. 1.5 cm carpet polyp in the ascending segment status post piecemeal snare polypectomy and tattooing. Innocent appearing colonic diverticulosis.  . COLONOSCOPY N/A 06/14/2016   Procedure: COLONOSCOPY;  Surgeon: Daneil Dolin, MD;  [facility];  Service: Endoscopy;  Laterality: N/A;  1200  . COLONOSCOPY WITH PROPOFOL N/A 11/28/2018   Procedure:  COLONOSCOPY WITH PROPOFOL;  Surgeon: Daneil Dolin, MD;  [facility];  Service: Endoscopy;  Laterality: N/A;  8:30am  . ESOPHAGOGASTRODUODENOSCOPY N/A 01/01/2016   RMR: Multiple gastric and duodenal erosions likely NSAID related.  Marland Kitchen GIVENS CAPSULE STUDY N/A 01/03/2016   Incomplete study, capsule never reach cecum. Scattered erosions and AVMs throughout the small bowel.  Marland Kitchen JOINT REPLACEMENT    . POLYPECTOMY  11/28/2018   Procedure: POLYPECTOMY;  Surgeon: Daneil Dolin, MD;  [facility];  Service: Endoscopy;;  ascending colon  . REPLACEMENT TOTAL KNEE Left   . SHOULDER ARTHROSCOPY WITH DISTAL CLAVICLE RESECTION Right 05/28/2015   Procedure: SHOULDER ARTHROSCOPY WITH DISTAL CLAVICLE RESECTION;  Surgeon: Melrose Nakayama, MD;  Location: [place];  Service: Orthopedics;  Laterality: Right;  . SHOULDER ARTHROSCOPY WITH SUBACROMIAL DECOMPRESSION Right 05/28/2015   Procedure: SHOULDER ARTHROSCOPY WITH SUBACROMIAL DECOMPRESSION;  Surgeon: Melrose Nakayama, MD;  Location: [place];  Service: Orthopedics;  Laterality: Right;  . THROAT SURGERY  1965    There were no vitals filed for this visit.  Subjective Assessment - 10/01/19 1531    Subjective  Pt reports SLS exercises are still difficult, but he is working on it. Pt reports he notices he isn't side-stepping as much this week.    Limitations  Walking  How long can you sit comfortably?  no issues    How long can you stand comfortably?  no issues    How long can you walk comfortably?  no issues    Patient Stated Goals  quit stumbling around    Currently in Pain?  No/denies   chronic back pain         [facility] Adult PT Treatment/Exercise - 10/01/19 0001      Knee/Hip Exercises: Standing   Heel Raises  Both;15 reps    Hip Abduction  Both;10 reps    Abduction Limitations  intermittent UE assist    Forward Step Up  Both;10 reps;Hand Hold: 0;Step Height: 4"          Balance Exercises -  10/01/19 1519      Balance Exercises: Standing   Toe Raise Limitations  sidestepping with flat foot cone taps, x2RT blue line    Sit to Stand Time  weighted walking, 40# forward and retro. x15 RT    Lift / Chop Limitations  tandem stance, BUE flexion with 3# bar, x10 reps each LE back; tandem stance, paloff press, GTB, x10 reps both directions    Other Standing Exercises  flat foot cone taps, x10 reps each LE; ladder drills, x10 minutes; weaving around cones, x2RT blue line        PT Education - 10/01/19 1427    Education Details  Exercise technique, continue HEP    Person(s) Educated  Patient    Methods  Explanation    Comprehension  Verbalized understanding       PT Short Term Goals - 09/29/19 1327      PT SHORT TERM GOAL #1   Title  To will be independent with HEP and perform 3x/week to improve BLE strength and reduce risk for falls.    Time  2    Period  Weeks    Status  On-going    Target Date  10/09/19      PT SHORT TERM GOAL #2   Title  Pt will perform 5x STS in 13 sec or < to demo improved functional strength with transfers.    Time  2    Period  Weeks    Status  On-going        PT Long Term Goals - 09/29/19 1329      PT LONG TERM GOAL #1   Title  Pt will ambulate 0.8 m/sec to improve safety and ability to ambulate community distances.    Time  4    Period  Weeks    Status  On-going      PT LONG TERM GOAL #2   Title  Pt will improve BLE strength by  MMT grade to improve gait mechanics and transfer abilities.    Time  4    Period  Weeks    Status  On-going      PT LONG TERM GOAL #3   Title  Pt will perform SLS for 15 sec bilaterally to decrease risk for falls.    Time  4    Period  Weeks    Status  On-going      PT LONG TERM GOAL #4   Title  Pt will score >19 on DGI to reduce risk for falls with walking program.    Time  4    Period  Weeks    Status  On-going            Plan - 10/01/19 1428  Clinical Impression Statement  Continued with  established POC, focusing on static and dynamic balance. Pt with increased difficulty performing flat foot cone taps, knocking cone down with L foot 25% of the time due to increased balance deficits with R SLS. Pt with mild difficulty sequencing ladder drills and weaving around cones requiring demonstration and verbal cues to perform correctly. Added dynamic balance challenges this date with fair balance demonstrated, requires decreased speed to maintain balance. Pt without loss of balance or requiring physical assistance during session. Continue to progress as able.    Personal Factors and Comorbidities  Age;Comorbidity 3+    Comorbidities  HTN, diabetes, COPD, LBP, arthritis    Examination-Activity Limitations  Locomotion Level    Examination-Participation Restrictions  Yard Work    Stability/Clinical Decision Making  Stable/Uncomplicated    Rehab Potential  Good    PT Frequency  2x / week    PT Duration  4 weeks    PT Treatment/Interventions  ADLs/Self Care Home Management;Aquatic Therapy;DME Instruction;Gait training;Stair training;Functional mobility training;Therapeutic activities;Therapeutic exercise;Balance training;Neuromuscular re-education;Patient/family education;Orthotic Fit/Training;Manual techniques;Passive range of motion;Dry needling;Taping;Joint Manipulations    PT Next Visit Plan  High level static and dynamic balance activities. Strengthening of glutes. Monitor chronic LBP with exercises.    PT Home Exercise Plan  Eval: tandem stance, vector stance with 3 sec holds (fwd/lateral/back)    Consulted and Agree with Plan of Care  Patient       Patient will benefit from skilled therapeutic intervention in order to improve the following deficits and impairments:  Abnormal gait, Cardiopulmonary status limiting activity, Decreased balance, Decreased strength, Difficulty walking, Impaired perceived functional ability, Pain  Visit Diagnosis: Unsteadiness on feet  Other abnormalities of  gait and mobility  Muscle weakness (generalized)     Problem List Patient Active Problem List   Diagnosis Date Noted  . Diverticulosis of colon without hemorrhage   . Solitary pulmonary nodule 02/10/2016  . Abnormal CT scan 02/02/2016  . Chronic GI bleeding 02/01/2016  . Anemia due to chronic blood loss 02/01/2016  . Colon polyps 01/04/2016  . 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 Gastric erosion with bleeding 01/02/2016  . Duodenal erosion 01/02/2016  . Tobacco abuse 01/01/2016  . Essential hypertension 01/01/2016  . Mucosal abnormality of stomach   . GI bleed 12/31/2015  . Diabetes mellitus without complication ([place])   . Hyperlipidemia   . BPH (benign prostatic hyperplasia)   . Carotid artery disease ([place])   . OSA on CPAP   . COPD (chronic obstructive pulmonary disease) ([place])      Talbot Grumbling PT, DPT 10/01/19, 3:33 PM [place], [place], [address] Phone: [phone]   Fax:  [phone]  Name: FEDERICK DACE MRN: [id number] Date of Birth: [birth year]

## 2019-10-07 ENCOUNTER — Other Ambulatory Visit: Payer: Self-pay

## 2019-10-07 ENCOUNTER — Ambulatory Visit (HOSPITAL_COMMUNITY): Payer: Medicare HMO | Admitting: Physical Therapy

## 2019-10-07 DIAGNOSIS — R2689 Other abnormalities of gait and mobility: Secondary | ICD-10-CM | POA: Diagnosis not present

## 2019-10-07 DIAGNOSIS — R2681 Unsteadiness on feet: Secondary | ICD-10-CM

## 2019-10-07 DIAGNOSIS — M6281 Muscle weakness (generalized): Secondary | ICD-10-CM | POA: Diagnosis not present

## 2019-10-07 NOTE — Therapy (Signed)
[place], [place], [address] Phone: [phone]   Fax:  [phone]  Physical Therapy Treatment  Patient Details  Name: Todd George MRN: [id number] Date of Birth: Mar 04, 1941 Referring Provider (PT): Allyn Kenner, MD   Encounter Date: 10/07/2019  PT End of Session - 10/07/19 1524    Visit Number  4    Number of Visits  8    Date for PT Re-Evaluation  11/20/19    Authorization Type  [employer]    Authorization Time Period  09/25/19 to 11/21/19    PT Start Time  1320    PT Stop Time  1400    PT Time Calculation (min)  40 min    Equipment Utilized During Treatment  Gait belt    Activity Tolerance  Patient tolerated treatment well    Behavior During Therapy  [facility] for tasks assessed/performed       Past Medical History:  Diagnosis Date  . Arthritis   . 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 BPH (benign prostatic hyperplasia)   . Cancer ([place])    skin cancer  . Carotid artery disease ([place])   . Cellulitis   . Chronic back pain   . Colon polyps 01/04/2016  . COPD (chronic obstructive pulmonary disease) ([place])    2ppd  . Diabetes mellitus without complication ([place])   . Duodenal erosion 01/02/2016  . Gastric erosion with bleeding 01/02/2016  . Hyperlipidemia   . Hypertension   . OSA on CPAP    not used machine in over 10 yrs    Past Surgical History:  Procedure Laterality Date  . CHOLECYSTECTOMY    . COLONOSCOPY N/A 01/03/2016   RMR: Active bleeding seen arising from the small bowel. Multiple cecal/ascending colon AVMs ablated. 1.5 cm carpet polyp in the ascending segment status post piecemeal snare polypectomy and tattooing. Innocent appearing colonic diverticulosis.  . COLONOSCOPY N/A 06/14/2016   Procedure: COLONOSCOPY;  Surgeon: Daneil Dolin, MD;  [facility];  Service: Endoscopy;  Laterality: N/A;  1200  . COLONOSCOPY WITH PROPOFOL N/A 11/28/2018   Procedure:  COLONOSCOPY WITH PROPOFOL;  Surgeon: Daneil Dolin, MD;  [facility];  Service: Endoscopy;  Laterality: N/A;  8:30am  . ESOPHAGOGASTRODUODENOSCOPY N/A 01/01/2016   RMR: Multiple gastric and duodenal erosions likely NSAID related.  Marland Kitchen GIVENS CAPSULE STUDY N/A 01/03/2016   Incomplete study, capsule never reach cecum. Scattered erosions and AVMs throughout the small bowel.  Marland Kitchen JOINT REPLACEMENT    . POLYPECTOMY  11/28/2018   Procedure: POLYPECTOMY;  Surgeon: Daneil Dolin, MD;  [facility];  Service: Endoscopy;;  ascending colon  . REPLACEMENT TOTAL KNEE Left   . SHOULDER ARTHROSCOPY WITH DISTAL CLAVICLE RESECTION Right 05/28/2015   Procedure: SHOULDER ARTHROSCOPY WITH DISTAL CLAVICLE RESECTION;  Surgeon: Melrose Nakayama, MD;  Location: [place];  Service: Orthopedics;  Laterality: Right;  . SHOULDER ARTHROSCOPY WITH SUBACROMIAL DECOMPRESSION Right 05/28/2015   Procedure: SHOULDER ARTHROSCOPY WITH SUBACROMIAL DECOMPRESSION;  Surgeon: Melrose Nakayama, MD;  Location: [place];  Service: Orthopedics;  Laterality: Right;  . THROAT SURGERY  1965    There were no vitals filed for this visit.  Subjective Assessment - 10/07/19 1326    Subjective  pt states he is doing okay today other than his knees are hurting, 4/10    Currently in Pain?  Yes    Pain Score  4  Pain Location  Knee    Pain Orientation  Right;Left    Pain Descriptors / Indicators  Aching;Sore                       [facility] Adult PT Treatment/Exercise - 10/07/19 0001      Knee/Hip Exercises: Standing   Heel Raises  Both;15 reps    Hip Abduction  Both;10 reps    Abduction Limitations  intermittent UE assist    Lateral Step Up  Both;10 reps;Step Height: 4";Hand Hold: 0;Limitations    Lateral Step Up Limitations  eccentric lowering    Forward Step Up  Both;10 reps;Hand Hold: 0;Step Height: 4"    Step Down  Both;10 reps;Hand Hold: 1;Step Height: 4"    Step Down  Limitations  eccentric lowering    SLS  bilaterally max of 12" Lt, 14" Rt    SLS with Vectors  both 5X3" each LE    Other Standing Knee Exercises  palloff GTB 10 reps each    Other Standing Knee Exercises  tandem 10 reps flexion, 10 reps push out and rotatte      Knee/Hip Exercises: Seated   Sit to Sand  10 reps;with UE support          Balance Exercises - 10/07/19 1512      Balance Exercises: Standing   Toe Raise Limitations  sidestepping with flat foot cone taps, x2RT blue line    Lift / Chop Limitations  tandem stance, BUE flexion with 3# bar, x10 reps each LE back; tandem stance, paloff press, GTB, x10 reps both directions    Other Standing Exercises  cone weaving 2RT          PT Short Term Goals - 09/29/19 1327      PT SHORT TERM GOAL #1   Title  To will be independent with HEP and perform 3x/week to improve BLE strength and reduce risk for falls.    Time  2    Period  Weeks    Status  On-going    Target Date  10/09/19      PT SHORT TERM GOAL #2   Title  Pt will perform 5x STS in 13 sec or < to demo improved functional strength with transfers.    Time  2    Period  Weeks    Status  On-going        PT Long Term Goals - 09/29/19 1329      PT LONG TERM GOAL #1   Title  Pt will ambulate 0.8 m/sec to improve safety and ability to ambulate community distances.    Time  4    Period  Weeks    Status  On-going      PT LONG TERM GOAL #2   Title  Pt will improve BLE strength by  MMT grade to improve gait mechanics and transfer abilities.    Time  4    Period  Weeks    Status  On-going      PT LONG TERM GOAL #3   Title  Pt will perform SLS for 15 sec bilaterally to decrease risk for falls.    Time  4    Period  Weeks    Status  On-going      PT LONG TERM GOAL #4   Title  Pt will score >19 on DGI to reduce risk for falls with walking program.    Time  4    Period  Weeks    Status  On-going            Plan - 10/07/19 1524    Clinical Impression  Statement  continued with focus on balance and strength.  Pt with improved ability to complete flat foot cone taps without knocking down the cones but still is struggling with ability to maintain SLS greater than 4" on each LE.  Increased difficulty of tandem UE task with addition of rotation.  Pt with more difficutly staying in balance with Rt foot in rear.  Pt able to complete all with only minimal discomfort in his knees and only 1 seated rest break.    Personal Factors and Comorbidities  Age;Comorbidity 3+    Comorbidities  HTN, diabetes, COPD, LBP, arthritis    Examination-Activity Limitations  Locomotion Level    Examination-Participation Restrictions  Yard Work    Stability/Clinical Decision Making  Stable/Uncomplicated    Rehab Potential  Good    PT Frequency  2x / week    PT Duration  4 weeks    PT Treatment/Interventions  ADLs/Self Care Home Management;Aquatic Therapy;DME Instruction;Gait training;Stair training;Functional mobility training;Therapeutic activities;Therapeutic exercise;Balance training;Neuromuscular re-education;Patient/family education;Orthotic Fit/Training;Manual techniques;Passive range of motion;Dry needling;Taping;Joint Manipulations    PT Next Visit Plan  High level static and dynamic balance activities. Strengthening of glutes. Monitor chronic LBP with exercises.    PT Home Exercise Plan  Eval: tandem stance, vector stance with 3 sec holds (fwd/lateral/back)    Consulted and Agree with Plan of Care  Patient       Patient will benefit from skilled therapeutic intervention in order to improve the following deficits and impairments:  Abnormal gait, Cardiopulmonary status limiting activity, Decreased balance, Decreased strength, Difficulty walking, Impaired perceived functional ability, Pain  Visit Diagnosis: Unsteadiness on feet  Other abnormalities of gait and mobility  Muscle weakness (generalized)     Problem List Patient Active Problem List   Diagnosis  Date Noted  . Diverticulosis of colon without hemorrhage   . Solitary pulmonary nodule 02/10/2016  . Abnormal CT scan 02/02/2016  . Chronic GI bleeding 02/01/2016  . Anemia due to chronic blood loss 02/01/2016  . Colon polyps 01/04/2016  . 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 Gastric erosion with bleeding 01/02/2016  . Duodenal erosion 01/02/2016  . Tobacco abuse 01/01/2016  . Essential hypertension 01/01/2016  . Mucosal abnormality of stomach   . GI bleed 12/31/2015  . Diabetes mellitus without complication ([place])   . Hyperlipidemia   . BPH (benign prostatic hyperplasia)   . Carotid artery disease ([place])   . OSA on CPAP   . COPD (chronic obstructive pulmonary disease) [facility])    Teena Irani, PTA/CLT [id number]  Teena Irani 10/07/2019, 3:27 PM  [place], [place], [address] Phone: [phone]   Fax:  [phone]  Name: Todd George MRN: [id number] Date of Birth: Oct 24, 1941

## 2019-10-08 ENCOUNTER — Telehealth (HOSPITAL_COMMUNITY): Payer: Self-pay | Admitting: Physical Therapy

## 2019-10-08 NOTE — Telephone Encounter (Signed)
I can not stand on my right leg and I can not do any PT today

## 2019-10-09 ENCOUNTER — Ambulatory Visit (HOSPITAL_COMMUNITY): Payer: Medicare HMO | Admitting: Physical Therapy

## 2019-10-09 DIAGNOSIS — H353231 Exudative age-related macular degeneration, bilateral, with active choroidal neovascularization: Secondary | ICD-10-CM | POA: Diagnosis not present

## 2019-10-13 ENCOUNTER — Telehealth (HOSPITAL_COMMUNITY): Payer: Self-pay | Admitting: Physical Therapy

## 2019-10-13 ENCOUNTER — Ambulatory Visit (HOSPITAL_COMMUNITY): Payer: Medicare HMO | Admitting: Physical Therapy

## 2019-10-13 NOTE — Telephone Encounter (Signed)
pt called to cx today's appt due to his knee is hurting 

## 2019-10-15 ENCOUNTER — Other Ambulatory Visit: Payer: Self-pay

## 2019-10-15 ENCOUNTER — Encounter (HOSPITAL_COMMUNITY): Payer: Self-pay

## 2019-10-15 ENCOUNTER — Ambulatory Visit (HOSPITAL_COMMUNITY): Payer: Medicare HMO

## 2019-10-15 DIAGNOSIS — R2681 Unsteadiness on feet: Secondary | ICD-10-CM | POA: Diagnosis not present

## 2019-10-15 DIAGNOSIS — R2689 Other abnormalities of gait and mobility: Secondary | ICD-10-CM

## 2019-10-15 DIAGNOSIS — M6281 Muscle weakness (generalized): Secondary | ICD-10-CM | POA: Diagnosis not present

## 2019-10-15 NOTE — Therapy (Signed)
[place], [place], [address] Phone: [phone]   Fax:  [phone]  Physical Therapy Treatment  Patient Details  Name: Todd George MRN: [id number] Date of Birth: 02-14-1941 Referring Provider (PT): Allyn Kenner, MD   Encounter Date: 10/15/2019  PT End of Session - 10/15/19 1306    Visit Number  5    Number of Visits  8    Date for PT Re-Evaluation  10/21/19   corrected on [id number] to match cert date   Authorization Type  [employer]    Authorization Time Period  09/25/19 to 10/21/19   corrected on [id number] to match cert date   PT Start Time  1300    PT Stop Time  1341    PT Time Calculation (min)  41 min    Activity Tolerance  Patient tolerated treatment well    Behavior During Therapy  [facility] for tasks assessed/performed       Past Medical History:  Diagnosis Date  . Arthritis   . 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 BPH (benign prostatic hyperplasia)   . Cancer ([place])    skin cancer  . Carotid artery disease ([place])   . Cellulitis   . Chronic back pain   . Colon polyps 01/04/2016  . COPD (chronic obstructive pulmonary disease) ([place])    2ppd  . Diabetes mellitus without complication ([place])   . Duodenal erosion 01/02/2016  . Gastric erosion with bleeding 01/02/2016  . Hyperlipidemia   . Hypertension   . OSA on CPAP    not used machine in over 10 yrs    Past Surgical History:  Procedure Laterality Date  . CHOLECYSTECTOMY    . COLONOSCOPY N/A 01/03/2016   RMR: Active bleeding seen arising from the small bowel. Multiple cecal/ascending colon AVMs ablated. 1.5 cm carpet polyp in the ascending segment status post piecemeal snare polypectomy and tattooing. Innocent appearing colonic diverticulosis.  . COLONOSCOPY N/A 06/14/2016   Procedure: COLONOSCOPY;  Surgeon: Daneil Dolin, MD;  [facility];  Service: Endoscopy;  Laterality: N/A;  1200  . COLONOSCOPY WITH  PROPOFOL N/A 11/28/2018   Procedure: COLONOSCOPY WITH PROPOFOL;  Surgeon: Daneil Dolin, MD;  [facility];  Service: Endoscopy;  Laterality: N/A;  8:30am  . ESOPHAGOGASTRODUODENOSCOPY N/A 01/01/2016   RMR: Multiple gastric and duodenal erosions likely NSAID related.  Marland Kitchen GIVENS CAPSULE STUDY N/A 01/03/2016   Incomplete study, capsule never reach cecum. Scattered erosions and AVMs throughout the small bowel.  Marland Kitchen JOINT REPLACEMENT    . POLYPECTOMY  11/28/2018   Procedure: POLYPECTOMY;  Surgeon: Daneil Dolin, MD;  [facility];  Service: Endoscopy;;  ascending colon  . REPLACEMENT TOTAL KNEE Left   . SHOULDER ARTHROSCOPY WITH DISTAL CLAVICLE RESECTION Right 05/28/2015   Procedure: SHOULDER ARTHROSCOPY WITH DISTAL CLAVICLE RESECTION;  Surgeon: Melrose Nakayama, MD;  Location: [place];  Service: Orthopedics;  Laterality: Right;  . SHOULDER ARTHROSCOPY WITH SUBACROMIAL DECOMPRESSION Right 05/28/2015   Procedure: SHOULDER ARTHROSCOPY WITH SUBACROMIAL DECOMPRESSION;  Surgeon: Melrose Nakayama, MD;  Location: [place];  Service: Orthopedics;  Laterality: Right;  . THROAT SURGERY  1965    There were no vitals filed for this visit.  Subjective Assessment - 10/15/19 1304    Subjective  pt states he thinks we have been doing too much lately. pt reports STS from chair made his R knee pain worse. Pt reports he feels  like his balance is going mcuh better.    Limitations  Walking    How long can you sit comfortably?  no issues    How long can you stand comfortably?  no issues    How long can you walk comfortably?  no issues    Patient Stated Goals  quit stumbling around    Currently in Pain?  No/denies   "normal chronic stuff"              [place] Adult PT Treatment/Exercise - 10/15/19 0001      Knee/Hip Exercises: Stretches   Gastroc Stretch  Both;3 reps;30 seconds    Gastroc Stretch Limitations  slant board          Balance Exercises -  10/15/19 1309      Balance Exercises: Standing   Tandem Stance  Eyes open;Foam/compliant surface;4 reps;30 secs    Balance Beam  foam balance beam, x1RT, protective stepping when needed, contact guard assist    Heel Raises Limitations  Tandem stance, solid surface, 2 reps, 30 sec, no UE assist, verbal cues for focus    Toe Raise Limitations  sidestepping with flat foot cone taps, x2RT blue line    Sit to Stand Time  sidestepping, RTB around ankles, x2RT blue line    Other Standing Exercises  flat foot cone taps, midline and crossing midline, x20 reps each, no UE assist        PT Education - 10/15/19 1305    Education Details  Exercise technique, updated HEP    Person(s) Educated  Patient    Methods  Explanation;Demonstration;Handout    Comprehension  Verbalized understanding;Returned demonstration       PT Short Term Goals - 09/29/19 1327      PT SHORT TERM GOAL #1   Title  To will be independent with HEP and perform 3x/week to improve BLE strength and reduce risk for falls.    Time  2    Period  Weeks    Status  On-going    Target Date  10/09/19      PT SHORT TERM GOAL #2   Title  Pt will perform 5x STS in 13 sec or < to demo improved functional strength with transfers.    Time  2    Period  Weeks    Status  On-going        PT Long Term Goals - 09/29/19 1329      PT LONG TERM GOAL #1   Title  Pt will ambulate 0.8 m/sec to improve safety and ability to ambulate community distances.    Time  4    Period  Weeks    Status  On-going      PT LONG TERM GOAL #2   Title  Pt will improve BLE strength by  MMT grade to improve gait mechanics and transfer abilities.    Time  4    Period  Weeks    Status  On-going      PT LONG TERM GOAL #3   Title  Pt will perform SLS for 15 sec bilaterally to decrease risk for falls.    Time  4    Period  Weeks    Status  On-going      PT LONG TERM GOAL #4   Title  Pt will score >19 on DGI to reduce risk for falls with walking  program.    Time  4    Period  Weeks  Status  On-going            Plan - 10/15/19 1306    Clinical Impression Statement  Added resisted sidestepping to challenge balance, pt with 2 loss of balance, able to right trunk without fall or assistance from therapist demonstrating improving protective reactions. Pt performed flat foot cone taps at midline with good balance, able to perform without UE assist. Pt with increased difficulty with crossing midline, requiring occasional UE assist on bars and trunk righting frequently. Added balance beam gait to challenge pt's protective stepping reactions and dynamic balance. Pt had to protective step off foam surface 50% of the time, no loss of balance or falls, only reaching with UEs to right trunk 3 times. Updated HEP with education on safe environment when performing standing balance exercises and pt verbalized understanding. Continue to progress as able.    Personal Factors and Comorbidities  Age;Comorbidity 3+    Comorbidities  HTN, diabetes, COPD, LBP, arthritis    Examination-Activity Limitations  Locomotion Level    Examination-Participation Restrictions  Yard Work    Stability/Clinical Decision Making  Stable/Uncomplicated    Rehab Potential  Good    PT Frequency  2x / week    PT Duration  4 weeks    PT Treatment/Interventions  ADLs/Self Care Home Management;Aquatic Therapy;DME Instruction;Gait training;Stair training;Functional mobility training;Therapeutic activities;Therapeutic exercise;Balance training;Neuromuscular re-education;Patient/family education;Orthotic Fit/Training;Manual techniques;Passive range of motion;Dry needling;Taping;Joint Manipulations    PT Next Visit Plan  High level static and dynamic balance activities. Strengthening of glutes. Monitor chronic LBP with exercises.    PT Home Exercise Plan  Eval: tandem stance, vector stance with 3 sec holds (fwd/lateral/back); 10/21:sidestepping at counter, hip abd    Consulted and  Agree with Plan of Care  Patient       Patient will benefit from skilled therapeutic intervention in order to improve the following deficits and impairments:  Abnormal gait, Cardiopulmonary status limiting activity, Decreased balance, Decreased strength, Difficulty walking, Impaired perceived functional ability, Pain  Visit Diagnosis: Unsteadiness on feet  Other abnormalities of gait and mobility  Muscle weakness (generalized)     Problem List Patient Active Problem List   Diagnosis Date Noted  . Diverticulosis of colon without hemorrhage   . Solitary pulmonary nodule 02/10/2016  . Abnormal CT scan 02/02/2016  . Chronic GI bleeding 02/01/2016  . Anemia due to chronic blood loss 02/01/2016  . Colon polyps 01/04/2016  . 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 Gastric erosion with bleeding 01/02/2016  . Duodenal erosion 01/02/2016  . Tobacco abuse 01/01/2016  . Essential hypertension 01/01/2016  . Mucosal abnormality of stomach   . GI bleed 12/31/2015  . Diabetes mellitus without complication ([place])   . Hyperlipidemia   . BPH (benign prostatic hyperplasia)   . Carotid artery disease ([place])   . OSA on CPAP   . COPD (chronic obstructive pulmonary disease) ([place])     Talbot Grumbling PT, DPT 10/15/19, 1:48 PM [place], [place], [address] Phone: [phone]   Fax:  [phone]  Name: Todd George MRN: [id number] Date of Birth: 11/28/1941

## 2019-10-20 ENCOUNTER — Telehealth (HOSPITAL_COMMUNITY): Payer: Self-pay

## 2019-10-20 ENCOUNTER — Ambulatory Visit (HOSPITAL_COMMUNITY): Payer: Medicare HMO

## 2019-10-20 NOTE — Telephone Encounter (Signed)
s/w the pt and he is aware that we  had to cancel today's appt due to the provider is out of the office today with a sick child

## 2019-10-22 ENCOUNTER — Other Ambulatory Visit: Payer: Self-pay

## 2019-10-22 ENCOUNTER — Ambulatory Visit (HOSPITAL_COMMUNITY): Payer: Medicare HMO

## 2019-10-22 ENCOUNTER — Encounter (HOSPITAL_COMMUNITY): Payer: Self-pay

## 2019-10-22 DIAGNOSIS — R2689 Other abnormalities of gait and mobility: Secondary | ICD-10-CM | POA: Diagnosis not present

## 2019-10-22 DIAGNOSIS — M6281 Muscle weakness (generalized): Secondary | ICD-10-CM | POA: Diagnosis not present

## 2019-10-22 DIAGNOSIS — R2681 Unsteadiness on feet: Secondary | ICD-10-CM

## 2019-10-22 NOTE — Therapy (Signed)
[place] [address] [place], [place], [address] Phone: [phone]   Fax:  [phone]   PHYSICAL THERAPY DISCHARGE SUMMARY  Visits from Start of Care: 6  Current functional level related to goals / functional outcomes: See below   Remaining deficits: See below   Education / Equipment: HEP, walking program  Plan: Patient agrees to discharge.  Patient goals were partially met. Patient is being discharged due to being pleased with the current functional level.  ?????       Physical Therapy Treatment  Patient Details  Name: Todd George MRN: [id number] Date of Birth: [birth year] Referring Provider (PT): Allyn Kenner, MD   Encounter Date: 10/22/2019  PT End of Session - 10/22/19 1445    Visit Number  6    Number of Visits  8    Date for PT Re-Evaluation  10/21/19   corrected on 95/63 to match cert date   Authorization Type  [employer]    Authorization Time Period  09/25/19 to 10/21/19   corrected on 87/56 to match cert date   PT Start Time  1436    PT Stop Time  1500    PT Time Calculation (min)  24 min    Activity Tolerance  Patient tolerated treatment well    Behavior During Therapy  [facility] for tasks assessed/performed       Past Medical History:  Diagnosis Date  . Arthritis   . 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 BPH (benign prostatic hyperplasia)   . Cancer ([place])    skin cancer  . Carotid artery disease ([place])   . Cellulitis   . Chronic back pain   . Colon polyps 01/04/2016  . COPD (chronic obstructive pulmonary disease) ([place])    2ppd  . Diabetes mellitus without complication ([place])   . Duodenal erosion 01/02/2016  . Gastric erosion with bleeding 01/02/2016  . Hyperlipidemia   . Hypertension   . OSA on CPAP    not used machine in over 10 yrs    Past Surgical History:  Procedure Laterality Date  . CHOLECYSTECTOMY    . COLONOSCOPY N/A 01/03/2016   RMR: Active bleeding  seen arising from the small bowel. Multiple cecal/ascending colon AVMs ablated. 1.5 cm carpet polyp in the ascending segment status post piecemeal snare polypectomy and tattooing. Innocent appearing colonic diverticulosis.  . COLONOSCOPY N/A 06/14/2016   Procedure: COLONOSCOPY;  Surgeon: Daneil Dolin, MD;  [facility];  Service: Endoscopy;  Laterality: N/A;  1200  . COLONOSCOPY WITH PROPOFOL N/A 11/28/2018   Procedure: COLONOSCOPY WITH PROPOFOL;  Surgeon: Daneil Dolin, MD;  [facility];  Service: Endoscopy;  Laterality: N/A;  8:30am  . ESOPHAGOGASTRODUODENOSCOPY N/A 01/01/2016   RMR: Multiple gastric and duodenal erosions likely NSAID related.  Marland Kitchen GIVENS CAPSULE STUDY N/A 01/03/2016   Incomplete study, capsule never reach cecum. Scattered erosions and AVMs throughout the small bowel.  Marland Kitchen JOINT REPLACEMENT    . POLYPECTOMY  11/28/2018   Procedure: POLYPECTOMY;  Surgeon: Daneil Dolin, MD;  [facility];  Service: Endoscopy;;  ascending colon  . REPLACEMENT TOTAL KNEE Left   . SHOULDER ARTHROSCOPY WITH DISTAL CLAVICLE RESECTION Right 05/28/2015   Procedure: SHOULDER ARTHROSCOPY WITH DISTAL CLAVICLE RESECTION;  Surgeon: Melrose Nakayama, MD;  Location: [place];  Service: Orthopedics;  Laterality: Right;  . SHOULDER ARTHROSCOPY WITH SUBACROMIAL DECOMPRESSION Right 05/28/2015   Procedure: SHOULDER ARTHROSCOPY WITH SUBACROMIAL DECOMPRESSION;  Surgeon: Melrose Nakayama, MD;  Location: [place];  Service: Orthopedics;  Laterality: Right;  . THROAT SURGERY  1965    There were no vitals filed for this visit.  Subjective Assessment - 10/22/19 1445    Subjective  Pt reports he had some difficulty walking yesterday on grass and gravel. Pt reports things have gotten "much much better since starting therapy"    Limitations  Walking    How long can you sit comfortably?  no issues    How long can you stand comfortably?  no issues    How long can  you walk comfortably?  no issues    Patient Stated Goals  quit stumbling around    Currently in Pain?  No/denies         [facility] PT Assessment - 10/22/19 0001      Assessment   Medical Diagnosis  Balance deficits    Referring Provider (PT)  Allyn Kenner, MD    Onset Date/Surgical Date  --   2-3 months   Next MD Visit  none scheduled    Prior Therapy  Not for current issue      Observation/Other Assessments   Focus on Therapeutic Outcomes (FOTO)   3% limited   was 23% limited     Functional Tests   Functional tests  Sit to Stand      Sit to Stand   Comments  5x STS: 13 sec from chair   was 15 sec     Strength   Right Hip Flexion  4+/5   was 4+   Right Hip Extension  3+/5   was 3+   Right Hip ABduction  4+/5   was 4+   Left Hip Flexion  4+/5   was 4+   Left Hip Extension  3+/5   was 3+   Left Hip ABduction  4+/5   was 4+   Right Knee Flexion  4+/5   was 4   Right Knee Extension  5/5   was 4+   Left Knee Flexion  4+/5   was 4   Left Knee Extension  5/5   was 5   Right Ankle Dorsiflexion  5/5   was 5   Left Ankle Dorsiflexion  5/5   was 5     Balance   Balance Assessed  Yes      Static Standing Balance   Static Standing - Balance Support  No upper extremity supported    Static Standing Balance -  Activities   Single Leg Stance - Right Leg;Single Leg Stance - Left Leg    Static Standing - Comment/# of Minutes  R: 18 sec, L: 20 sec   was R: 11 sec or <, L: 6.5 sec or <     Dynamic Gait Index   Level Surface  Normal    Change in Gait Speed  Mild Impairment    Gait with Horizontal Head Turns  Normal    Gait with Vertical Head Turns  Mild Impairment    Gait and Pivot Turn  Mild Impairment    Step Over Obstacle  Normal    Step Around Obstacles  Mild Impairment    Steps  Normal    Total Score  20    DGI comment:  was 16                PT Education - 10/22/19 1523    Education Details  reassessment findings, d/c to HEP and regular walking  program     Person(s) Educated  Patient    Methods  Explanation    Comprehension  Verbalized understanding       PT Short Term Goals - 10/22/19 1446      PT SHORT TERM GOAL #1   Title  To will be independent with HEP and perform 3x/week to improve BLE strength and reduce risk for falls.    Baseline  10/28: 2x/week    Time  2    Period  Weeks    Status  Not Met    Target Date  10/09/19      PT SHORT TERM GOAL #2   Title  Pt will perform 5x STS in 13 sec or < to demo improved functional strength with transfers.    Baseline  10/28: 13 sec from chair    Time  2    Period  Weeks    Status  Achieved        PT Long Term Goals - 10/22/19 1448      PT LONG TERM GOAL #1   Title  Pt will ambulate 0.8 m/sec to improve safety and ability to ambulate community distances.    Baseline  10/28: was 0.47ms on 10/5    Time  4    Period  Weeks    Status  Achieved      PT LONG TERM GOAL #2   Title  Pt will improve BLE strength by  MMT grade to improve gait mechanics and transfer abilities.    Baseline  10/28: see MMT    Time  4    Period  Weeks    Status  Partially Met      PT LONG TERM GOAL #3   Title  Pt will perform SLS for 15 sec bilaterally to decrease risk for falls.    Baseline  10/28: R: 18 sec, L: 20 sec    Time  4    Period  Weeks    Status  Achieved      PT LONG TERM GOAL #4   Title  Pt will score >19 on DGI to reduce risk for falls with walking program.    Baseline  10/28: 20    Time  4    Period  Weeks    Status  Achieved            Plan - 10/22/19 1445    Clinical Impression Statement  Pt due to reassessment this session. Pt with minimal improvements in BLE strength per MMT. Functionally, pt with significant improvements in 5x STS score and DGI indicating pt is at a decreased risk for falls. Pt's gait pattern continues to be improved, able to clear bil feet from floor with each step and no unsteadiness noted with ambulation on level surface. Subjectively, pt with  significant improvement from 23% limited to 3% limited in daily actvities. Pt is appropriate to d/c at this time due to meeting and progressing towards all goals and able to independently progress at home.    Personal Factors and Comorbidities  Age;Comorbidity 3+    Comorbidities  HTN, diabetes, COPD, LBP, arthritis    Examination-Activity Limitations  Locomotion Level    Examination-Participation Restrictions  Yard Work    Stability/Clinical Decision Making  Stable/Uncomplicated    Rehab Potential  Good    PT Frequency  2x / week    PT Duration  4 weeks    PT Treatment/Interventions  ADLs/Self Care Home Management;Aquatic Therapy;DME Instruction;Gait training;Stair training;Functional mobility  training;Therapeutic activities;Therapeutic exercise;Balance training;Neuromuscular re-education;Patient/family education;Orthotic Fit/Training;Manual techniques;Passive range of motion;Dry needling;Taping;Joint Manipulations    PT Next Visit Plan  d/c to HEP    PT Home Exercise Plan  Eval: tandem stance, vector stance with 3 sec holds (fwd/lateral/back); 10/21:sidestepping at counter, hip abd    Consulted and Agree with Plan of Care  Patient       Patient will benefit from skilled therapeutic intervention in order to improve the following deficits and impairments:  Abnormal gait, Cardiopulmonary status limiting activity, Decreased balance, Decreased strength, Difficulty walking, Impaired perceived functional ability, Pain  Visit Diagnosis: Unsteadiness on feet  Other abnormalities of gait and mobility  Muscle weakness (generalized)     Problem List Patient Active Problem List   Diagnosis Date Noted  . Diverticulosis of colon without hemorrhage   . Solitary pulmonary nodule 02/10/2016  . Abnormal CT scan 02/02/2016  . Chronic GI bleeding 02/01/2016  . Anemia due to chronic blood loss 02/01/2016  . Colon polyps 01/04/2016  . AVM (arteriovenous malformation) of colon with hemorrhage  01/04/2016  . Gastric erosion with bleeding 01/02/2016  . Duodenal erosion 01/02/2016  . Tobacco abuse 01/01/2016  . Essential hypertension 01/01/2016  . Mucosal abnormality of stomach   . GI bleed 12/31/2015  . Diabetes mellitus without complication ([place])   . Hyperlipidemia   . BPH (benign prostatic hyperplasia)   . Carotid artery disease ([place])   . OSA on CPAP   . COPD (chronic obstructive pulmonary disease) ([place])      Talbot Grumbling PT, DPT 10/22/19, 3:26 PM [place], [place], [address] Phone: [phone]   Fax:  [phone]  Name: Todd George MRN: [id number] Date of Birth: 04/25/1941

## 2019-10-29 DIAGNOSIS — M65351 Trigger finger, right little finger: Secondary | ICD-10-CM | POA: Diagnosis not present

## 2019-10-31 DIAGNOSIS — H524 Presbyopia: Secondary | ICD-10-CM | POA: Diagnosis not present

## 2019-10-31 DIAGNOSIS — Z0100 Encounter for examination of eyes and vision without abnormal findings: Secondary | ICD-10-CM | POA: Diagnosis not present

## 2019-10-31 DIAGNOSIS — M1712 Unilateral primary osteoarthritis, left knee: Secondary | ICD-10-CM | POA: Diagnosis not present

## 2019-10-31 DIAGNOSIS — H353233 Exudative age-related macular degeneration, bilateral, with inactive scar: Secondary | ICD-10-CM | POA: Diagnosis not present

## 2019-10-31 DIAGNOSIS — M1711 Unilateral primary osteoarthritis, right knee: Secondary | ICD-10-CM | POA: Diagnosis not present

## 2019-10-31 DIAGNOSIS — M170 Bilateral primary osteoarthritis of knee: Secondary | ICD-10-CM | POA: Diagnosis not present

## 2019-10-31 DIAGNOSIS — M25561 Pain in right knee: Secondary | ICD-10-CM | POA: Diagnosis not present

## 2019-10-31 DIAGNOSIS — Z961 Presence of intraocular lens: Secondary | ICD-10-CM | POA: Diagnosis not present

## 2019-11-06 DIAGNOSIS — H35371 Puckering of macula, right eye: Secondary | ICD-10-CM | POA: Diagnosis not present

## 2019-11-06 DIAGNOSIS — H43822 Vitreomacular adhesion, left eye: Secondary | ICD-10-CM | POA: Diagnosis not present

## 2019-11-06 DIAGNOSIS — H43813 Vitreous degeneration, bilateral: Secondary | ICD-10-CM | POA: Diagnosis not present

## 2019-11-06 DIAGNOSIS — H353231 Exudative age-related macular degeneration, bilateral, with active choroidal neovascularization: Secondary | ICD-10-CM | POA: Diagnosis not present

## 2019-11-10 DIAGNOSIS — M25561 Pain in right knee: Secondary | ICD-10-CM | POA: Diagnosis not present

## 2019-11-10 DIAGNOSIS — M1711 Unilateral primary osteoarthritis, right knee: Secondary | ICD-10-CM | POA: Diagnosis not present

## 2019-11-17 DIAGNOSIS — M1711 Unilateral primary osteoarthritis, right knee: Secondary | ICD-10-CM | POA: Diagnosis not present

## 2019-11-17 DIAGNOSIS — M25561 Pain in right knee: Secondary | ICD-10-CM | POA: Diagnosis not present

## 2019-11-24 DIAGNOSIS — M25561 Pain in right knee: Secondary | ICD-10-CM | POA: Diagnosis not present

## 2019-11-24 DIAGNOSIS — M1711 Unilateral primary osteoarthritis, right knee: Secondary | ICD-10-CM | POA: Diagnosis not present

## 2019-11-26 DIAGNOSIS — M65351 Trigger finger, right little finger: Secondary | ICD-10-CM | POA: Diagnosis not present

## 2019-12-11 DIAGNOSIS — H353231 Exudative age-related macular degeneration, bilateral, with active choroidal neovascularization: Secondary | ICD-10-CM | POA: Diagnosis not present

## 2020-01-08 DIAGNOSIS — E119 Type 2 diabetes mellitus without complications: Secondary | ICD-10-CM | POA: Diagnosis not present

## 2020-01-08 DIAGNOSIS — I10 Essential (primary) hypertension: Secondary | ICD-10-CM | POA: Diagnosis not present

## 2020-01-08 DIAGNOSIS — E1169 Type 2 diabetes mellitus with other specified complication: Secondary | ICD-10-CM | POA: Diagnosis not present

## 2020-01-08 DIAGNOSIS — E782 Mixed hyperlipidemia: Secondary | ICD-10-CM | POA: Diagnosis not present

## 2020-01-12 DIAGNOSIS — E1169 Type 2 diabetes mellitus with other specified complication: Secondary | ICD-10-CM | POA: Diagnosis not present

## 2020-01-12 DIAGNOSIS — M419 Scoliosis, unspecified: Secondary | ICD-10-CM | POA: Diagnosis not present

## 2020-01-12 DIAGNOSIS — R944 Abnormal results of kidney function studies: Secondary | ICD-10-CM | POA: Diagnosis not present

## 2020-01-12 DIAGNOSIS — E782 Mixed hyperlipidemia: Secondary | ICD-10-CM | POA: Diagnosis not present

## 2020-01-12 DIAGNOSIS — F17210 Nicotine dependence, cigarettes, uncomplicated: Secondary | ICD-10-CM | POA: Diagnosis not present

## 2020-01-12 DIAGNOSIS — H3530 Unspecified macular degeneration: Secondary | ICD-10-CM | POA: Diagnosis not present

## 2020-01-12 DIAGNOSIS — I10 Essential (primary) hypertension: Secondary | ICD-10-CM | POA: Diagnosis not present

## 2020-01-19 DIAGNOSIS — H353231 Exudative age-related macular degeneration, bilateral, with active choroidal neovascularization: Secondary | ICD-10-CM | POA: Diagnosis not present

## 2020-01-19 DIAGNOSIS — H43813 Vitreous degeneration, bilateral: Secondary | ICD-10-CM | POA: Diagnosis not present

## 2020-01-19 DIAGNOSIS — H35371 Puckering of macula, right eye: Secondary | ICD-10-CM | POA: Diagnosis not present

## 2020-01-19 DIAGNOSIS — H43822 Vitreomacular adhesion, left eye: Secondary | ICD-10-CM | POA: Diagnosis not present

## 2020-02-26 DIAGNOSIS — H353231 Exudative age-related macular degeneration, bilateral, with active choroidal neovascularization: Secondary | ICD-10-CM | POA: Diagnosis not present

## 2020-03-22 DIAGNOSIS — L820 Inflamed seborrheic keratosis: Secondary | ICD-10-CM | POA: Diagnosis not present

## 2020-03-22 DIAGNOSIS — L708 Other acne: Secondary | ICD-10-CM | POA: Diagnosis not present

## 2020-03-22 DIAGNOSIS — Z1283 Encounter for screening for malignant neoplasm of skin: Secondary | ICD-10-CM | POA: Diagnosis not present

## 2020-03-22 DIAGNOSIS — D225 Melanocytic nevi of trunk: Secondary | ICD-10-CM | POA: Diagnosis not present

## 2020-03-24 DIAGNOSIS — M419 Scoliosis, unspecified: Secondary | ICD-10-CM | POA: Diagnosis not present

## 2020-03-24 DIAGNOSIS — E782 Mixed hyperlipidemia: Secondary | ICD-10-CM | POA: Diagnosis not present

## 2020-03-24 DIAGNOSIS — Z0189 Encounter for other specified special examinations: Secondary | ICD-10-CM | POA: Diagnosis not present

## 2020-03-24 DIAGNOSIS — H3530 Unspecified macular degeneration: Secondary | ICD-10-CM | POA: Diagnosis not present

## 2020-03-24 DIAGNOSIS — E119 Type 2 diabetes mellitus without complications: Secondary | ICD-10-CM | POA: Diagnosis not present

## 2020-03-24 DIAGNOSIS — I10 Essential (primary) hypertension: Secondary | ICD-10-CM | POA: Diagnosis not present

## 2020-03-24 DIAGNOSIS — E1169 Type 2 diabetes mellitus with other specified complication: Secondary | ICD-10-CM | POA: Diagnosis not present

## 2020-03-24 DIAGNOSIS — F17210 Nicotine dependence, cigarettes, uncomplicated: Secondary | ICD-10-CM | POA: Diagnosis not present

## 2020-03-24 DIAGNOSIS — R944 Abnormal results of kidney function studies: Secondary | ICD-10-CM | POA: Diagnosis not present

## 2020-04-05 DIAGNOSIS — H353231 Exudative age-related macular degeneration, bilateral, with active choroidal neovascularization: Secondary | ICD-10-CM | POA: Diagnosis not present

## 2020-04-05 DIAGNOSIS — H35371 Puckering of macula, right eye: Secondary | ICD-10-CM | POA: Diagnosis not present

## 2020-04-05 DIAGNOSIS — H43822 Vitreomacular adhesion, left eye: Secondary | ICD-10-CM | POA: Diagnosis not present

## 2020-04-05 DIAGNOSIS — H43813 Vitreous degeneration, bilateral: Secondary | ICD-10-CM | POA: Diagnosis not present

## 2020-04-13 DIAGNOSIS — M65351 Trigger finger, right little finger: Secondary | ICD-10-CM | POA: Diagnosis not present

## 2020-04-15 DIAGNOSIS — M1711 Unilateral primary osteoarthritis, right knee: Secondary | ICD-10-CM | POA: Diagnosis not present

## 2020-04-15 DIAGNOSIS — M1712 Unilateral primary osteoarthritis, left knee: Secondary | ICD-10-CM | POA: Diagnosis not present

## 2020-04-15 DIAGNOSIS — M170 Bilateral primary osteoarthritis of knee: Secondary | ICD-10-CM | POA: Diagnosis not present

## 2020-04-20 DIAGNOSIS — M65351 Trigger finger, right little finger: Secondary | ICD-10-CM | POA: Diagnosis not present

## 2020-05-04 ENCOUNTER — Ambulatory Visit (HOSPITAL_COMMUNITY): Payer: Medicare HMO | Attending: Orthopaedic Surgery | Admitting: Occupational Therapy

## 2020-05-04 ENCOUNTER — Encounter (HOSPITAL_COMMUNITY): Payer: Self-pay | Admitting: Occupational Therapy

## 2020-05-04 ENCOUNTER — Other Ambulatory Visit: Payer: Self-pay

## 2020-05-04 DIAGNOSIS — M79644 Pain in right finger(s): Secondary | ICD-10-CM | POA: Diagnosis not present

## 2020-05-04 DIAGNOSIS — R29898 Other symptoms and signs involving the musculoskeletal system: Secondary | ICD-10-CM | POA: Diagnosis not present

## 2020-05-04 NOTE — Patient Instructions (Signed)
Tendon Gliding Exercises: Complete 10X, hold for 3-5 seconds, 1-2x per day  1) Straight: begin with wrist in extended position and fingers straight      2) Hook: Bend your fingers making them look like a hook while keeping your thumb straight.      3) Fist: Make your hand into a fist.      4) Table Top: Straighten your fingers straight out making them look like a table top.      5) Straight Fist: Bend your fingers straight down into a straight fist.      Complete each exercise 10-15X, 2-3X/day  1) Towel crunch Place a small towel on a firm table top. Flatten out the towel and then place your hand on one end of it.  Next, flex your fingers 2-5 (index finger through pinky finger) as you pull the towel towards your hand.    2) Digit composite flexion/adduction (make a fist) Hold your hand up as shown. Open and close your hand into a fist and repeat. If you cannot make a full fist, then make a partial fist.      3) Finger Taps Start with the hand flat and fingers slightly spread.  One at a time, starting with the thumb, lift each finger up separately.

## 2020-05-04 NOTE — Therapy (Signed)
[place], [place], [address] Phone: [phone]   Fax:  [phone]  Occupational Therapy Evaluation  Patient Details  Name: Todd George MRN: [id number] Date of Birth: Jun 23, 1941 Referring Provider (OT): Dr. Harlin Heys   Encounter Date: 05/04/2020  OT End of Session - 05/04/20 1622    Visit Number  1    Number of Visits  1    Date for OT Re-Evaluation  05/07/20    Authorization Type  [employer]    Progress Note Due on Visit  10    OT Start Time  1438    OT Stop Time  1510    OT Time Calculation (min)  32 min    Activity Tolerance  Patient tolerated treatment well    Behavior During Therapy  [facility] for tasks assessed/performed       Past Medical History:  Diagnosis Date  . Arthritis   . 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 BPH (benign prostatic hyperplasia)   . Cancer ([place])    skin cancer  . Carotid artery disease ([place])   . Cellulitis   . Chronic back pain   . Colon polyps 01/04/2016  . COPD (chronic obstructive pulmonary disease) ([place])    2ppd  . Diabetes mellitus without complication ([place])   . Duodenal erosion 01/02/2016  . Gastric erosion with bleeding 01/02/2016  . Hyperlipidemia   . Hypertension   . OSA on CPAP    not used machine in over 10 yrs    Past Surgical History:  Procedure Laterality Date  . CHOLECYSTECTOMY    . COLONOSCOPY N/A 01/03/2016   RMR: Active bleeding seen arising from the small bowel. Multiple cecal/ascending colon AVMs ablated. 1.5 cm carpet polyp in the ascending segment status post piecemeal snare polypectomy and tattooing. Innocent appearing colonic diverticulosis.  . COLONOSCOPY N/A 06/14/2016   Procedure: COLONOSCOPY;  Surgeon: Daneil Dolin, MD;  [facility];  Service: Endoscopy;  Laterality: N/A;  1200  . COLONOSCOPY WITH PROPOFOL N/A 11/28/2018   Procedure: COLONOSCOPY WITH PROPOFOL;  Surgeon: Daneil Dolin,  MD;  [facility];  Service: Endoscopy;  Laterality: N/A;  8:30am  . ESOPHAGOGASTRODUODENOSCOPY N/A 01/01/2016   RMR: Multiple gastric and duodenal erosions likely NSAID related.  Marland Kitchen GIVENS CAPSULE STUDY N/A 01/03/2016   Incomplete study, capsule never reach cecum. Scattered erosions and AVMs throughout the small bowel.  Marland Kitchen JOINT REPLACEMENT    . POLYPECTOMY  11/28/2018   Procedure: POLYPECTOMY;  Surgeon: Daneil Dolin, MD;  [facility];  Service: Endoscopy;;  ascending colon  . REPLACEMENT TOTAL KNEE Left   . SHOULDER ARTHROSCOPY WITH DISTAL CLAVICLE RESECTION Right 05/28/2015   Procedure: SHOULDER ARTHROSCOPY WITH DISTAL CLAVICLE RESECTION;  Surgeon: Melrose Nakayama, MD;  Location: [place];  Service: Orthopedics;  Laterality: Right;  . SHOULDER ARTHROSCOPY WITH SUBACROMIAL DECOMPRESSION Right 05/28/2015   Procedure: SHOULDER ARTHROSCOPY WITH SUBACROMIAL DECOMPRESSION;  Surgeon: Melrose Nakayama, MD;  Location: [place];  Service: Orthopedics;  Laterality: Right;  . THROAT SURGERY  1965    There were no vitals filed for this visit.  Subjective Assessment - 05/04/20 1601    Subjective   S: I need these sutures out.    Pertinent History  Pt is an 79 y/o male s/p right 5th digit trigger A1 pulley release on 04/20/20. Pt presents with 3 sutures at volar MP joint, area surrounding is swollen  and taunt. Pt was referred to occupational therapy for suture removal, evaluation, and treatment by Dr. Harlin Heys.    Patient Stated Goals  To have less pain in my finger.    Currently in Pain?  Yes    Pain Score  3     Pain Location  Hand    Pain Orientation  Right    Pain Descriptors / Indicators  Sore    Pain Type  Acute pain    Pain Radiating Towards  N/A    Pain Onset  1 to 4 weeks ago    Pain Frequency  Intermittent    Aggravating Factors   sutures    Pain Relieving Factors  rest    Effect of Pain on Daily Activities  min effect on ADLs     Multiple Pain Sites  No        [facility] OT Assessment - 05/04/20 1436      Assessment   Medical Diagnosis  s/p right 5th digit trigger finger A1 pulley release    Referring Provider (OT)  Dr. Harlin Heys    Onset Date/Surgical Date  04/20/20    Hand Dominance  Right    Next MD Visit  05/18/2020    Prior Therapy  None      Precautions   Precautions  Other (comment)    Precaution Comments  Follow [place] Hand protocol       Restrictions   Weight Bearing Restrictions  No      Balance Screen   Has the patient fallen in the past 6 months  No    Has the patient had a decrease in activity level because of a fear of falling?   No    Is the patient reluctant to leave their home because of a fear of falling?   No      Prior Function   Level of Independence  Independent    Vocation  Retired    Leisure  Building services engineer, working on the computer      ADL   ADL comments  Pt is having difficulty with using weedeater, using eating utensils, driving, holding items due to tenderness at the volar MP joint      Written Expression   Dominant Hand  Right      Cognition   Overall Cognitive Status  Within Functional Limits for tasks assessed      ROM / Strength   AROM / PROM / Strength  --      AROM   Overall AROM Comments  hand A/ROM is WNL      Right Hand AROM   R Little  MCP 0-90  80 Degrees    R Little PIP 0-100  68 Degrees    R Little DIP 0-70  60 Degrees      Hand Function   Right Hand Gross Grasp  Functional    Right Hand Grip (lbs)  50    Right Hand Lateral Pinch  20 lbs    Right Hand 3 Point Pinch  16 lbs    Left Hand Gross Grasp  Functional    Left Hand Grip (lbs)  60    Left Hand Lateral Pinch  12 lbs    Left 3 point pinch  14 lbs                      OT Education - 05/04/20 1505    Education Details  tendon gliding, finger ROM exercises  Person(s) Educated  Patient    Methods  Explanation;Demonstration;Handout    Comprehension  Verbalized  understanding;Returned demonstration       OT Short Term Goals - 05/04/20 1700      OT SHORT TERM GOAL #1   Title  Pt will be provided with and educated on HEP for mobility of right 5th digit.    Time  1    Period  Days    Status  Achieved    Target Date  05/04/20               Plan - 05/04/20 1622    Clinical Impression Statement  A: Pt is an 79 y/o male presenting s/p right 5th digit trigger finger A1 pulley release. Pt with 3 sutures ready for removal, wound care PT assisting with suture removal as area is slightly swollen and sutures are taunt. Two sutures removed with ease, 1st suture most proximal to 5th digit with remaining piece inside area, educated pt on suture working its way out with time. Pt demonstrating full ROM and reports no difficulty with ADLs with exception of some tenderness that he works around. Educted pt on tendon glides, A/ROM, and self-massage per [place] hand protocol and provided for HEP. At this time pt has no further OT needs and prefers to complete HEP for ROM.    OT Occupational Profile and History  Problem Focused Assessment - Including review of records relating to presenting problem    Occupational performance deficits (Please refer to evaluation for details):  ADL's;IADL's;Leisure    Body Structure / Function / Physical Skills  ADL;Pain;Fascial restriction;Scar mobility;IADL;Edema    Rehab Potential  Good    Clinical Decision Making  Limited treatment options, no task modification necessary    Comorbidities Affecting Occupational Performance:  None    Modification or Assistance to Complete Evaluation   No modification of tasks or assist necessary to complete eval    OT Frequency  One time visit    OT Treatment/Interventions  Self-care/ADL training;Patient/family education    Plan  P: Pt will complete HEP for ROM. Educated pt on reaching out to MD if he is bothered by remaining portion of suture or suspects any issues with area.    OT Home  Exercise Plan  eval: tendon glides, A/ROM, self-massage    Consulted and Agree with Plan of Care  Patient       Patient will benefit from skilled therapeutic intervention in order to improve the following deficits and impairments:   Body Structure / Function / Physical Skills: ADL, Pain, Fascial restriction, Scar mobility, IADL, Edema       Visit Diagnosis: Pain in right finger(s)  Other symptoms and signs involving the musculoskeletal system    Problem List Patient Active Problem List   Diagnosis Date Noted  . Diverticulosis of colon without hemorrhage   . Solitary pulmonary nodule 02/10/2016  . Abnormal CT scan 02/02/2016  . Chronic GI bleeding 02/01/2016  . Anemia due to chronic blood loss 02/01/2016  . Colon polyps 01/04/2016  . 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 Gastric erosion with bleeding 01/02/2016  . Duodenal erosion 01/02/2016  . Tobacco abuse 01/01/2016  . Essential hypertension 01/01/2016  . Mucosal abnormality of stomach   . GI bleed 12/31/2015  . Diabetes mellitus without complication ([place])   . Hyperlipidemia   . BPH (benign prostatic hyperplasia)   . Carotid artery disease ([place])   . OSA on CPAP   . COPD (  chronic obstructive pulmonary disease) [facility])    Guadelupe Sabin, OTR/L  [phone] 05/04/2020, 5:02 PM  [place], [place], [address] Phone: [phone]   Fax:  [phone]  Name: Todd George MRN: [id number] Date of Birth: June 29, 1941

## 2020-05-17 DIAGNOSIS — H353231 Exudative age-related macular degeneration, bilateral, with active choroidal neovascularization: Secondary | ICD-10-CM | POA: Diagnosis not present

## 2020-05-31 DIAGNOSIS — M25561 Pain in right knee: Secondary | ICD-10-CM | POA: Diagnosis not present

## 2020-05-31 DIAGNOSIS — M1711 Unilateral primary osteoarthritis, right knee: Secondary | ICD-10-CM | POA: Diagnosis not present

## 2020-06-07 DIAGNOSIS — M1711 Unilateral primary osteoarthritis, right knee: Secondary | ICD-10-CM | POA: Diagnosis not present

## 2020-06-14 DIAGNOSIS — M1711 Unilateral primary osteoarthritis, right knee: Secondary | ICD-10-CM | POA: Diagnosis not present

## 2020-06-14 DIAGNOSIS — M25561 Pain in right knee: Secondary | ICD-10-CM | POA: Diagnosis not present

## 2020-06-24 DIAGNOSIS — H35371 Puckering of macula, right eye: Secondary | ICD-10-CM | POA: Diagnosis not present

## 2020-06-24 DIAGNOSIS — H43813 Vitreous degeneration, bilateral: Secondary | ICD-10-CM | POA: Diagnosis not present

## 2020-06-24 DIAGNOSIS — H353231 Exudative age-related macular degeneration, bilateral, with active choroidal neovascularization: Secondary | ICD-10-CM | POA: Diagnosis not present

## 2020-06-24 DIAGNOSIS — H43822 Vitreomacular adhesion, left eye: Secondary | ICD-10-CM | POA: Diagnosis not present

## 2020-07-01 DIAGNOSIS — B356 Tinea cruris: Secondary | ICD-10-CM | POA: Diagnosis not present

## 2020-07-01 DIAGNOSIS — K219 Gastro-esophageal reflux disease without esophagitis: Secondary | ICD-10-CM | POA: Diagnosis not present

## 2020-07-01 DIAGNOSIS — M6530 Trigger finger, unspecified finger: Secondary | ICD-10-CM | POA: Diagnosis not present

## 2020-07-02 DIAGNOSIS — M25561 Pain in right knee: Secondary | ICD-10-CM | POA: Diagnosis not present

## 2020-07-02 DIAGNOSIS — M1711 Unilateral primary osteoarthritis, right knee: Secondary | ICD-10-CM | POA: Diagnosis not present

## 2020-07-08 DIAGNOSIS — H3530 Unspecified macular degeneration: Secondary | ICD-10-CM | POA: Diagnosis not present

## 2020-07-08 DIAGNOSIS — K219 Gastro-esophageal reflux disease without esophagitis: Secondary | ICD-10-CM | POA: Diagnosis not present

## 2020-07-08 DIAGNOSIS — Z0189 Encounter for other specified special examinations: Secondary | ICD-10-CM | POA: Diagnosis not present

## 2020-07-08 DIAGNOSIS — F17210 Nicotine dependence, cigarettes, uncomplicated: Secondary | ICD-10-CM | POA: Diagnosis not present

## 2020-07-08 DIAGNOSIS — M6530 Trigger finger, unspecified finger: Secondary | ICD-10-CM | POA: Diagnosis not present

## 2020-07-08 DIAGNOSIS — I10 Essential (primary) hypertension: Secondary | ICD-10-CM | POA: Diagnosis not present

## 2020-07-08 DIAGNOSIS — E119 Type 2 diabetes mellitus without complications: Secondary | ICD-10-CM | POA: Diagnosis not present

## 2020-07-08 DIAGNOSIS — E782 Mixed hyperlipidemia: Secondary | ICD-10-CM | POA: Diagnosis not present

## 2020-07-08 DIAGNOSIS — M419 Scoliosis, unspecified: Secondary | ICD-10-CM | POA: Diagnosis not present

## 2020-07-15 DIAGNOSIS — Z0001 Encounter for general adult medical examination with abnormal findings: Secondary | ICD-10-CM | POA: Diagnosis not present

## 2020-07-15 DIAGNOSIS — I10 Essential (primary) hypertension: Secondary | ICD-10-CM | POA: Diagnosis not present

## 2020-07-15 DIAGNOSIS — E119 Type 2 diabetes mellitus without complications: Secondary | ICD-10-CM | POA: Diagnosis not present

## 2020-07-15 DIAGNOSIS — M419 Scoliosis, unspecified: Secondary | ICD-10-CM | POA: Diagnosis not present

## 2020-07-15 DIAGNOSIS — F17210 Nicotine dependence, cigarettes, uncomplicated: Secondary | ICD-10-CM | POA: Diagnosis not present

## 2020-07-15 DIAGNOSIS — H3530 Unspecified macular degeneration: Secondary | ICD-10-CM | POA: Diagnosis not present

## 2020-07-15 DIAGNOSIS — K219 Gastro-esophageal reflux disease without esophagitis: Secondary | ICD-10-CM | POA: Diagnosis not present

## 2020-07-15 DIAGNOSIS — Z716 Tobacco abuse counseling: Secondary | ICD-10-CM | POA: Diagnosis not present

## 2020-07-15 DIAGNOSIS — R944 Abnormal results of kidney function studies: Secondary | ICD-10-CM | POA: Diagnosis not present

## 2020-07-29 DIAGNOSIS — H353231 Exudative age-related macular degeneration, bilateral, with active choroidal neovascularization: Secondary | ICD-10-CM | POA: Diagnosis not present

## 2020-09-06 DIAGNOSIS — H43822 Vitreomacular adhesion, left eye: Secondary | ICD-10-CM | POA: Diagnosis not present

## 2020-09-06 DIAGNOSIS — H353231 Exudative age-related macular degeneration, bilateral, with active choroidal neovascularization: Secondary | ICD-10-CM | POA: Diagnosis not present

## 2020-09-06 DIAGNOSIS — H35371 Puckering of macula, right eye: Secondary | ICD-10-CM | POA: Diagnosis not present

## 2020-09-06 DIAGNOSIS — H43813 Vitreous degeneration, bilateral: Secondary | ICD-10-CM | POA: Diagnosis not present

## 2020-09-18 DIAGNOSIS — I10 Essential (primary) hypertension: Secondary | ICD-10-CM | POA: Diagnosis not present

## 2020-09-18 DIAGNOSIS — R140 Abdominal distension (gaseous): Secondary | ICD-10-CM | POA: Diagnosis not present

## 2020-09-18 DIAGNOSIS — G2581 Restless legs syndrome: Secondary | ICD-10-CM | POA: Diagnosis not present

## 2020-09-23 DIAGNOSIS — D649 Anemia, unspecified: Secondary | ICD-10-CM | POA: Diagnosis not present

## 2020-09-23 DIAGNOSIS — G603 Idiopathic progressive neuropathy: Secondary | ICD-10-CM | POA: Diagnosis not present

## 2020-09-23 DIAGNOSIS — G894 Chronic pain syndrome: Secondary | ICD-10-CM | POA: Diagnosis not present

## 2020-09-23 DIAGNOSIS — M545 Low back pain: Secondary | ICD-10-CM | POA: Diagnosis not present

## 2020-09-23 DIAGNOSIS — Z79899 Other long term (current) drug therapy: Secondary | ICD-10-CM | POA: Diagnosis not present

## 2020-09-23 DIAGNOSIS — G2581 Restless legs syndrome: Secondary | ICD-10-CM | POA: Diagnosis not present

## 2020-09-24 DIAGNOSIS — D649 Anemia, unspecified: Secondary | ICD-10-CM | POA: Diagnosis not present

## 2020-09-24 DIAGNOSIS — Z79899 Other long term (current) drug therapy: Secondary | ICD-10-CM | POA: Diagnosis not present

## 2020-09-28 DIAGNOSIS — E538 Deficiency of other specified B group vitamins: Secondary | ICD-10-CM | POA: Diagnosis not present

## 2020-10-06 DIAGNOSIS — M542 Cervicalgia: Secondary | ICD-10-CM | POA: Diagnosis not present

## 2020-10-06 DIAGNOSIS — M1711 Unilateral primary osteoarthritis, right knee: Secondary | ICD-10-CM | POA: Diagnosis not present

## 2020-10-12 DIAGNOSIS — M65351 Trigger finger, right little finger: Secondary | ICD-10-CM | POA: Diagnosis not present

## 2020-10-14 DIAGNOSIS — H43813 Vitreous degeneration, bilateral: Secondary | ICD-10-CM | POA: Diagnosis not present

## 2020-10-14 DIAGNOSIS — H43822 Vitreomacular adhesion, left eye: Secondary | ICD-10-CM | POA: Diagnosis not present

## 2020-10-14 DIAGNOSIS — H35423 Microcystoid degeneration of retina, bilateral: Secondary | ICD-10-CM | POA: Diagnosis not present

## 2020-10-14 DIAGNOSIS — H353231 Exudative age-related macular degeneration, bilateral, with active choroidal neovascularization: Secondary | ICD-10-CM | POA: Diagnosis not present

## 2020-10-14 DIAGNOSIS — H35371 Puckering of macula, right eye: Secondary | ICD-10-CM | POA: Diagnosis not present

## 2020-10-18 DIAGNOSIS — L820 Inflamed seborrheic keratosis: Secondary | ICD-10-CM | POA: Diagnosis not present

## 2020-10-19 ENCOUNTER — Other Ambulatory Visit: Payer: Self-pay

## 2020-10-19 ENCOUNTER — Encounter (HOSPITAL_COMMUNITY): Payer: Self-pay | Admitting: Physical Therapy

## 2020-10-19 ENCOUNTER — Ambulatory Visit (HOSPITAL_COMMUNITY): Payer: Medicare HMO | Attending: Sports Medicine | Admitting: Physical Therapy

## 2020-10-19 DIAGNOSIS — M542 Cervicalgia: Secondary | ICD-10-CM | POA: Insufficient documentation

## 2020-10-19 DIAGNOSIS — R29898 Other symptoms and signs involving the musculoskeletal system: Secondary | ICD-10-CM

## 2020-10-19 NOTE — Therapy (Signed)
[place], [place], [address] Phone: [phone]   Fax:  [phone]  Physical Therapy Screen  Patient Details  Name: Todd George MRN: [id number] Date of Birth: 1941/06/19 Referring Provider (PT): Pedro Earls   Encounter Date: 10/19/2020        [facility] PT Assessment - 10/19/20 0001      Assessment   Medical Diagnosis neck pain    Referring Provider (PT) Pedro Earls    Prior Therapy yes for neck and back pain a year ago                               Patient present for an evaluation on this date. During subjective information patient reported he just wanted to have a device/way to correct his right shoulder as it is lower than his left and causes him pain. Discussed current posture and cued patient to bring hip to neutral, (stands in trendelenburg and lumbar side bending). With correction patient reported no pain and happy with posture. Patient to work on this cue at home independently but stated he would return to therapy if he feels he needs to go a more traditional PT route. No units billed as session was 14 minute screen. Will keep order active incase patient desires to return to therapy.    Visit Diagnosis: Cervicalgia  Decreased range of motion of neck    2:25 PM, 10/19/20 Jerene Pitch, DPT Physical Therapy with [facility]  [phone] office  [place] [address] [place], [place], [address] Phone: [phone]   Fax:  [phone]  Name: Todd George MRN: [id number] Date of Birth: [birth year]

## 2020-10-27 ENCOUNTER — Ambulatory Visit (HOSPITAL_COMMUNITY): Payer: Medicare HMO | Admitting: Physical Therapy

## 2020-10-28 DIAGNOSIS — F17210 Nicotine dependence, cigarettes, uncomplicated: Secondary | ICD-10-CM | POA: Diagnosis not present

## 2020-10-28 DIAGNOSIS — M419 Scoliosis, unspecified: Secondary | ICD-10-CM | POA: Diagnosis not present

## 2020-10-28 DIAGNOSIS — S40261A Insect bite (nonvenomous) of right shoulder, initial encounter: Secondary | ICD-10-CM | POA: Diagnosis not present

## 2020-10-28 DIAGNOSIS — Z0001 Encounter for general adult medical examination with abnormal findings: Secondary | ICD-10-CM | POA: Diagnosis not present

## 2020-10-28 DIAGNOSIS — E782 Mixed hyperlipidemia: Secondary | ICD-10-CM | POA: Diagnosis not present

## 2020-10-28 DIAGNOSIS — Z0189 Encounter for other specified special examinations: Secondary | ICD-10-CM | POA: Diagnosis not present

## 2020-10-28 DIAGNOSIS — H3530 Unspecified macular degeneration: Secondary | ICD-10-CM | POA: Diagnosis not present

## 2020-10-28 DIAGNOSIS — I10 Essential (primary) hypertension: Secondary | ICD-10-CM | POA: Diagnosis not present

## 2020-10-28 DIAGNOSIS — E119 Type 2 diabetes mellitus without complications: Secondary | ICD-10-CM | POA: Diagnosis not present

## 2020-10-29 DIAGNOSIS — M1711 Unilateral primary osteoarthritis, right knee: Secondary | ICD-10-CM | POA: Diagnosis not present

## 2020-11-01 DIAGNOSIS — J449 Chronic obstructive pulmonary disease, unspecified: Secondary | ICD-10-CM | POA: Diagnosis not present

## 2020-11-01 DIAGNOSIS — G2581 Restless legs syndrome: Secondary | ICD-10-CM | POA: Diagnosis not present

## 2020-11-01 DIAGNOSIS — E1169 Type 2 diabetes mellitus with other specified complication: Secondary | ICD-10-CM | POA: Diagnosis not present

## 2020-11-01 DIAGNOSIS — E782 Mixed hyperlipidemia: Secondary | ICD-10-CM | POA: Diagnosis not present

## 2020-11-01 DIAGNOSIS — M1711 Unilateral primary osteoarthritis, right knee: Secondary | ICD-10-CM | POA: Diagnosis not present

## 2020-11-01 DIAGNOSIS — F17218 Nicotine dependence, cigarettes, with other nicotine-induced disorders: Secondary | ICD-10-CM | POA: Diagnosis not present

## 2020-11-01 DIAGNOSIS — R140 Abdominal distension (gaseous): Secondary | ICD-10-CM | POA: Diagnosis not present

## 2020-11-01 DIAGNOSIS — I10 Essential (primary) hypertension: Secondary | ICD-10-CM | POA: Diagnosis not present

## 2020-11-04 DIAGNOSIS — Z79899 Other long term (current) drug therapy: Secondary | ICD-10-CM | POA: Diagnosis not present

## 2020-11-04 DIAGNOSIS — D649 Anemia, unspecified: Secondary | ICD-10-CM | POA: Diagnosis not present

## 2020-11-04 DIAGNOSIS — M545 Low back pain, unspecified: Secondary | ICD-10-CM | POA: Diagnosis not present

## 2020-11-04 DIAGNOSIS — G2581 Restless legs syndrome: Secondary | ICD-10-CM | POA: Diagnosis not present

## 2020-11-04 DIAGNOSIS — G603 Idiopathic progressive neuropathy: Secondary | ICD-10-CM | POA: Diagnosis not present

## 2020-11-11 ENCOUNTER — Other Ambulatory Visit: Payer: Self-pay

## 2020-11-11 ENCOUNTER — Encounter (HOSPITAL_COMMUNITY): Payer: Self-pay | Admitting: Physical Therapy

## 2020-11-11 ENCOUNTER — Ambulatory Visit (HOSPITAL_COMMUNITY): Payer: Medicare HMO | Attending: Sports Medicine | Admitting: Physical Therapy

## 2020-11-11 DIAGNOSIS — R262 Difficulty in walking, not elsewhere classified: Secondary | ICD-10-CM | POA: Diagnosis not present

## 2020-11-11 DIAGNOSIS — G8929 Other chronic pain: Secondary | ICD-10-CM | POA: Diagnosis not present

## 2020-11-11 DIAGNOSIS — M545 Low back pain, unspecified: Secondary | ICD-10-CM | POA: Diagnosis not present

## 2020-11-11 DIAGNOSIS — M6281 Muscle weakness (generalized): Secondary | ICD-10-CM | POA: Insufficient documentation

## 2020-11-11 DIAGNOSIS — R293 Abnormal posture: Secondary | ICD-10-CM | POA: Diagnosis not present

## 2020-11-11 NOTE — Therapy (Signed)
[place], [place], [address] Phone: [phone]   Fax:  [phone]  Physical Therapy Evaluation  Patient Details  Name: BURNETTE SAUTTER MRN: [id number] Date of Birth: 28-May-1941 Referring Provider (PT): Barton Fanny, NP (Send to [facility])   Encounter Date: 11/11/2020   PT End of Session - 11/11/20 1402    Visit Number 1    Number of Visits 8    Date for PT Re-Evaluation 12/23/20    Authorization Type [employer] medic[employer] - auth required - check auth , no VL    PT Start Time 1402    PT Stop Time 1432    PT Time Calculation (min) 30 min    Activity Tolerance Patient tolerated treatment well    Behavior During Therapy [facility] for tasks assessed/performed           Past Medical History:  Diagnosis Date  . Arthritis   . 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 BPH (benign prostatic hyperplasia)   . Cancer ([place])    skin cancer  . Carotid artery disease ([place])   . Cellulitis   . Chronic back pain   . Colon polyps 01/04/2016  . COPD (chronic obstructive pulmonary disease) ([place])    2ppd  . Diabetes mellitus without complication ([place])   . Duodenal erosion 01/02/2016  . Gastric erosion with bleeding 01/02/2016  . Hyperlipidemia   . Hypertension   . OSA on CPAP    not used machine in over 10 yrs    Past Surgical History:  Procedure Laterality Date  . CHOLECYSTECTOMY    . COLONOSCOPY N/A 01/03/2016   RMR: Active bleeding seen arising from the small bowel. Multiple cecal/ascending colon AVMs ablated. 1.5 cm carpet polyp in the ascending segment status post piecemeal snare polypectomy and tattooing. Innocent appearing colonic diverticulosis.  . COLONOSCOPY N/A 06/14/2016   Procedure: COLONOSCOPY;  Surgeon: Daneil Dolin, MD;  [facility];  Service: Endoscopy;  Laterality: N/A;  1200  . COLONOSCOPY WITH PROPOFOL N/A 11/28/2018   Procedure: COLONOSCOPY WITH PROPOFOL;  Surgeon:  Daneil Dolin, MD;  [facility];  Service: Endoscopy;  Laterality: N/A;  8:30am  . ESOPHAGOGASTRODUODENOSCOPY N/A 01/01/2016   RMR: Multiple gastric and duodenal erosions likely NSAID related.  Marland Kitchen GIVENS CAPSULE STUDY N/A 01/03/2016   Incomplete study, capsule never reach cecum. Scattered erosions and AVMs throughout the small bowel.  Marland Kitchen JOINT REPLACEMENT    . POLYPECTOMY  11/28/2018   Procedure: POLYPECTOMY;  Surgeon: Daneil Dolin, MD;  [facility];  Service: Endoscopy;;  ascending colon  . REPLACEMENT TOTAL KNEE Left   . SHOULDER ARTHROSCOPY WITH DISTAL CLAVICLE RESECTION Right 05/28/2015   Procedure: SHOULDER ARTHROSCOPY WITH DISTAL CLAVICLE RESECTION;  Surgeon: Melrose Nakayama, MD;  Location: [place];  Service: Orthopedics;  Laterality: Right;  . SHOULDER ARTHROSCOPY WITH SUBACROMIAL DECOMPRESSION Right 05/28/2015   Procedure: SHOULDER ARTHROSCOPY WITH SUBACROMIAL DECOMPRESSION;  Surgeon: Melrose Nakayama, MD;  Location: [place];  Service: Orthopedics;  Laterality: Right;  . THROAT SURGERY  1965    There were no vitals filed for this visit.    Subjective Assessment - 11/11/20 1410    Subjective States that he has had low back pain for a while. States that he would like to work on posture. States that he has been practicing his hip correction exercise and now his right hip is starting to hurt. States he has  been practicing his posture and mainly wants to improve posture where he doesn't have to think about it.  Current pain he is having pain in his neck but that is temporary. States he has pain in his back about 4/10 with standing and walking which reminds him to straighten up and that helps with his symptoms. States standing and walking are his biggest complaints at this time. States he feels his balance is also challenging him occasionally and thinks it is related to his posture.    Pertinent History HTN, DB. CAD. COPD, L TKA, R shoulder  scope, scoliosis    Limitations Walking;Standing    Currently in Pain? Yes    Pain Score 3     Pain Location Back    Pain Orientation Mid    Pain Descriptors / Indicators Aching;Dull    Pain Type Chronic pain    Pain Frequency Intermittent    Aggravating Factors  walking and standing and with bad posture    Pain Relieving Factors good posture              [facility] PT Assessment - 11/11/20 0001      Assessment   Medical Diagnosis back pain and posture    Referring Provider (PT) Barton Fanny, NP   Send to [place]   Has the patient fallen in the past 6 months No      Prior Function   Level of Independence Independent    Vocation Retired      Observation/Other Assessments   Observations left torticollis - able to correct with verbal cues, slumped posture and rounded shoulders, forward head.     Focus on Therapeutic Outcomes (FOTO)  69% function      AROM   Cervical Flexion --   WFL   Cervical Extension 35    Cervical - Right Side Bend 21   pain in neck   Cervical - Left Side Bend 38   pain in neck    Cervical - Right Rotation 45   head forward    Cervical - Left Rotation 40   head forward     Ambulation/Gait   Ambulation/Gait Yes    Ambulation Distance (Feet) 376 Feet    Assistive device None    Gait velocity WNL    Gait Comments limited trunk and hip ROT noted, shoulders level , left torticollis, wide BOS, - 2MW                      Objective measurements completed on examination: See above findings.       [place] Adult PT Treatment/Exercise - 11/11/20 0001      Exercises   Exercises Neck      Neck Exercises: Standing   Neck Retraction 5 reps    Other Standing Exercises posture awareness with mirror                   PT Education - 11/11/20 1431    Education Details on current condition, presentation, POC and posture    Person(s) Educated Patient    Methods Explanation    Comprehension Verbalized understanding              PT Short Term Goals - 11/11/20 1435      PT SHORT TERM GOAL #1   Title Patient will be independent in self management strategies to improve quality of life and functional outcomes.    Time 3  Period Weeks    Status New    Target Date 12/02/20      PT SHORT TERM GOAL #2   Title Patient will report at least 25% improvement in overall symptoms and/or function to demonstrate improved functional mobility    Time 3    Period Weeks    Status New    Target Date 12/02/20             PT Long Term Goals - 11/11/20 1436      PT LONG TERM GOAL #1   Title Patient will be able to sit in good posture for at least 60 seconds without verbal cues to improve overall posture endurance    Time 6    Period Weeks    Status New    Target Date 12/23/20      PT LONG TERM GOAL #2   Title Patient will report at least 50% improvement in overall symptoms and/or function to demonstrate improved functional mobility    Time 6    Period Weeks    Status New    Target Date 12/23/20      PT LONG TERM GOAL #3   Title Patient will be able to walk with fair posture and no tripping over feet to improve dynamic balance    Baseline (tripped over feet once with 2 minute walk)    Time 6    Period Weeks    Status New    Target Date 12/23/20                  Plan - 11/11/20 1432    Clinical Impression Statement Patient presents to therapy with complaints of neck and back pain and primary concern of posture. Patient presents with forward head, left torticollis, lateral trunk shift and rounded shoulders. Educated patient on current presentation and plan moving forward. Posture also affecting patient's balance at this time and occasionally trips over his feet. Patient would greatly benefit from skilled physical therapy to improve functional mobility, reduce fall risk and improve overall quality of life.    Personal Factors and Comorbidities Comorbidity 1;Comorbidity 3+;Comorbidity 2     Comorbidities HTN, DB. CAD. COPD, L TKA, R shoulder scope    Examination-Activity Limitations Stand;Locomotion Level;Lift;Stairs    Examination-Participation Restrictions Community Activity    Stability/Clinical Decision Making Stable/Uncomplicated    Clinical Decision Making Low    Rehab Potential Good    PT Frequency Other (comment)   1-2x/week for total of 8 visits over 6 week certification   PT Duration 6 weeks    PT Treatment/Interventions ADLs/Self Care Home Management;Aquatic Therapy;Electrical Stimulation;Cryotherapy;Moist Heat;Traction;Balance training;Therapeutic exercise;Therapeutic activities;Stair training;Gait training;DME Instruction;Neuromuscular re-education;Patient/family education;Manual techniques;Passive range of motion;Dry needling;Joint Manipulations    PT Next Visit Plan posture exercises, posterior chain strengthening and anterior stretching - cues to correct posture, trunk rotation    PT Home Exercise Plan posture (visual cues), cervical retraction    Consulted and Agree with Plan of Care Patient           Patient will benefit from skilled therapeutic intervention in order to improve the following deficits and impairments:  Pain, Postural dysfunction, Decreased range of motion, Decreased mobility, Decreased balance, Decreased activity tolerance, Decreased strength  Visit Diagnosis: Chronic midline low back pain without sciatica  Difficulty in walking, not elsewhere classified  Muscle weakness (generalized)  Abnormal posture     Problem List Patient Active Problem List   Diagnosis Date Noted  . Diverticulosis of colon without hemorrhage   .  Solitary pulmonary nodule 02/10/2016  . Abnormal CT scan 02/02/2016  . Chronic GI bleeding 02/01/2016  . Anemia due to chronic blood loss 02/01/2016  . Colon polyps 01/04/2016  . 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 Gastric erosion with bleeding 01/02/2016  . Duodenal erosion  01/02/2016  . Tobacco abuse 01/01/2016  . Essential hypertension 01/01/2016  . Mucosal abnormality of stomach   . GI bleed 12/31/2015  . Diabetes mellitus without complication ([place])   . Hyperlipidemia   . BPH (benign prostatic hyperplasia)   . Carotid artery disease ([place])   . OSA on CPAP   . COPD (chronic obstructive pulmonary disease) ([place])    2:39 PM, 11/11/20 Jerene Pitch, DPT Physical Therapy with [facility]  [phone] office  [place] [address] [place], [place], [address] Phone: [phone]   Fax:  [phone]  Name: SAVVAS ROPER MRN: [id number] Date of Birth: [birth year]

## 2020-11-15 DIAGNOSIS — H353231 Exudative age-related macular degeneration, bilateral, with active choroidal neovascularization: Secondary | ICD-10-CM | POA: Diagnosis not present

## 2020-11-17 ENCOUNTER — Telehealth (HOSPITAL_COMMUNITY): Payer: Self-pay

## 2020-11-17 ENCOUNTER — Ambulatory Visit (HOSPITAL_COMMUNITY): Payer: Medicare HMO

## 2020-11-17 DIAGNOSIS — M25561 Pain in right knee: Secondary | ICD-10-CM | POA: Diagnosis not present

## 2020-11-17 DIAGNOSIS — M1711 Unilateral primary osteoarthritis, right knee: Secondary | ICD-10-CM | POA: Diagnosis not present

## 2020-11-17 DIAGNOSIS — Z96652 Presence of left artificial knee joint: Secondary | ICD-10-CM | POA: Diagnosis not present

## 2020-11-17 NOTE — Telephone Encounter (Signed)
pt called to cx this appt due to he has another appt in [place].

## 2020-11-24 ENCOUNTER — Encounter (HOSPITAL_COMMUNITY): Payer: Medicare HMO | Admitting: Physical Therapy

## 2020-11-25 ENCOUNTER — Telehealth (HOSPITAL_COMMUNITY): Payer: Self-pay

## 2020-11-25 ENCOUNTER — Ambulatory Visit (HOSPITAL_COMMUNITY): Payer: Medicare HMO | Attending: Sports Medicine

## 2020-11-25 DIAGNOSIS — J449 Chronic obstructive pulmonary disease, unspecified: Secondary | ICD-10-CM | POA: Diagnosis not present

## 2020-11-25 DIAGNOSIS — G8929 Other chronic pain: Secondary | ICD-10-CM | POA: Insufficient documentation

## 2020-11-25 DIAGNOSIS — R262 Difficulty in walking, not elsewhere classified: Secondary | ICD-10-CM | POA: Insufficient documentation

## 2020-11-25 DIAGNOSIS — M1711 Unilateral primary osteoarthritis, right knee: Secondary | ICD-10-CM | POA: Diagnosis not present

## 2020-11-25 DIAGNOSIS — I10 Essential (primary) hypertension: Secondary | ICD-10-CM | POA: Diagnosis not present

## 2020-11-25 DIAGNOSIS — M6281 Muscle weakness (generalized): Secondary | ICD-10-CM | POA: Insufficient documentation

## 2020-11-25 DIAGNOSIS — F17218 Nicotine dependence, cigarettes, with other nicotine-induced disorders: Secondary | ICD-10-CM | POA: Diagnosis not present

## 2020-11-25 DIAGNOSIS — M545 Low back pain, unspecified: Secondary | ICD-10-CM | POA: Insufficient documentation

## 2020-11-25 DIAGNOSIS — G2581 Restless legs syndrome: Secondary | ICD-10-CM | POA: Diagnosis not present

## 2020-11-25 DIAGNOSIS — R293 Abnormal posture: Secondary | ICD-10-CM | POA: Insufficient documentation

## 2020-11-25 DIAGNOSIS — Z716 Tobacco abuse counseling: Secondary | ICD-10-CM | POA: Diagnosis not present

## 2020-11-25 NOTE — Telephone Encounter (Signed)
No show, attempted to call with no answer or answering machine available to leave message.  Ihor Austin, LPTA/CLT; Delana Meyer [phone]

## 2020-11-30 ENCOUNTER — Other Ambulatory Visit: Payer: Self-pay | Admitting: Internal Medicine

## 2020-11-30 ENCOUNTER — Other Ambulatory Visit (HOSPITAL_COMMUNITY): Payer: Self-pay | Admitting: Internal Medicine

## 2020-11-30 ENCOUNTER — Telehealth (HOSPITAL_COMMUNITY): Payer: Self-pay | Admitting: Physical Therapy

## 2020-11-30 DIAGNOSIS — I6521 Occlusion and stenosis of right carotid artery: Secondary | ICD-10-CM

## 2020-11-30 NOTE — Telephone Encounter (Signed)
He overbooked himself and can not be here 12/8-08/2020-will return Monday 12/06/20

## 2020-12-01 ENCOUNTER — Ambulatory Visit (HOSPITAL_COMMUNITY): Payer: Medicare HMO | Admitting: Physical Therapy

## 2020-12-02 ENCOUNTER — Ambulatory Visit (HOSPITAL_COMMUNITY): Payer: Medicare HMO

## 2020-12-06 ENCOUNTER — Other Ambulatory Visit: Payer: Self-pay

## 2020-12-06 ENCOUNTER — Ambulatory Visit (HOSPITAL_COMMUNITY): Payer: Medicare HMO | Admitting: Physical Therapy

## 2020-12-06 ENCOUNTER — Encounter (HOSPITAL_COMMUNITY): Payer: Self-pay | Admitting: Physical Therapy

## 2020-12-06 DIAGNOSIS — G8929 Other chronic pain: Secondary | ICD-10-CM

## 2020-12-06 DIAGNOSIS — M6281 Muscle weakness (generalized): Secondary | ICD-10-CM

## 2020-12-06 DIAGNOSIS — M545 Low back pain, unspecified: Secondary | ICD-10-CM | POA: Diagnosis not present

## 2020-12-06 DIAGNOSIS — R293 Abnormal posture: Secondary | ICD-10-CM

## 2020-12-06 DIAGNOSIS — R262 Difficulty in walking, not elsewhere classified: Secondary | ICD-10-CM | POA: Diagnosis not present

## 2020-12-06 NOTE — Therapy (Signed)
[facility] [address] [place], [place], [address] Phone: [phone]   Fax:  [phone]  Physical Therapy Treatment and Discharge Summary  Patient Details  Name: Todd George MRN: [id number] Date of Birth: 1941-07-22 Referring Provider (PT): Barton Fanny, NP   PHYSICAL THERAPY DISCHARGE SUMMARY  Visits from Start of Care: 2  Current functional level related to goals / functional outcomes: Patient has met 2/2 STG and 3/3 LTG   Remaining deficits: None at this time   Education / Equipment: Pt education in HEP/postural re-education strategies Plan: Patient agrees to discharge.  Patient goals were met. Patient is being discharged due to meeting the stated rehab goals.  ?????        Encounter Date: 12/06/2020   PT End of Session - 12/06/20 1517    Visit Number 2    Number of Visits 8    Date for PT Re-Evaluation 12/23/20    Authorization Type [employer] - auth required - check auth , no VL    PT Start Time 1447    PT Stop Time 1515    PT Time Calculation (min) 28 min    Activity Tolerance Patient tolerated treatment well    Behavior During Therapy WFL for tasks assessed/performed           Past Medical History:  Diagnosis Date  . Arthritis   . 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 BPH (benign prostatic hyperplasia)   . Cancer ([place])    skin cancer  . Carotid artery disease ([place])   . Cellulitis   . Chronic back pain   . Colon polyps 01/04/2016  . COPD (chronic obstructive pulmonary disease) ([place])    2ppd  . Diabetes mellitus without complication ([place])   . Duodenal erosion 01/02/2016  . Gastric erosion with bleeding 01/02/2016  . Hyperlipidemia   . Hypertension   . OSA on CPAP    not used machine in over 10 yrs    Past Surgical History:  Procedure Laterality Date  . CHOLECYSTECTOMY    . COLONOSCOPY N/A 01/03/2016   RMR: Active bleeding seen arising from the small  bowel. Multiple cecal/ascending colon AVMs ablated. 1.5 cm carpet polyp in the ascending segment status post piecemeal snare polypectomy and tattooing. Innocent appearing colonic diverticulosis.  . COLONOSCOPY N/A 06/14/2016   Procedure: COLONOSCOPY;  Surgeon: Daneil Dolin, MD;  [facility];  Service: Endoscopy;  Laterality: N/A;  1200  . COLONOSCOPY WITH PROPOFOL N/A 11/28/2018   Procedure: COLONOSCOPY WITH PROPOFOL;  Surgeon: Daneil Dolin, MD;  [facility];  Service: Endoscopy;  Laterality: N/A;  8:30am  . ESOPHAGOGASTRODUODENOSCOPY N/A 01/01/2016   RMR: Multiple gastric and duodenal erosions likely NSAID related.  Marland Kitchen GIVENS CAPSULE STUDY N/A 01/03/2016   Incomplete study, capsule never reach cecum. Scattered erosions and AVMs throughout the small bowel.  Marland Kitchen JOINT REPLACEMENT    . POLYPECTOMY  11/28/2018   Procedure: POLYPECTOMY;  Surgeon: Daneil Dolin, MD;  [facility];  Service: Endoscopy;;  ascending colon  . REPLACEMENT TOTAL KNEE Left   . SHOULDER ARTHROSCOPY WITH DISTAL CLAVICLE RESECTION Right 05/28/2015   Procedure: SHOULDER ARTHROSCOPY WITH DISTAL CLAVICLE RESECTION;  Surgeon: Melrose Nakayama, MD;  Location: [facility];  Service: Orthopedics;  Laterality: Right;  . SHOULDER ARTHROSCOPY WITH SUBACROMIAL DECOMPRESSION Right 05/28/2015   Procedure: SHOULDER ARTHROSCOPY WITH SUBACROMIAL DECOMPRESSION;  Surgeon: Melrose Nakayama, MD;  Location: [facility];  Service: Orthopedics;  Laterality: Right;  . THROAT SURGERY  1965    There were no vitals filed for this visit.   Subjective Assessment - 12/06/20 1448    Subjective Reports feeling better today and that he is performing at his optimal level. He reports he has been walking for exercise/recreation and has been able to mindfully correct posture. Patient reports he is completing daily HEP for posture and stretching and feels that he has accomplished all of his stated goals and  would like to D/C further sessions. PAtient reports feeling 60% improvement since start of care.    Pertinent History HTN, DB. CAD. COPD, L TKA, R shoulder scope, scoliosis    Limitations Walking;Standing    Currently in Pain? No/denies    Pain Score 0-No pain    Pain Orientation Mid              [facility] PT Assessment - 12/06/20 0001      Assessment   Medical Diagnosis back pain and posture    Referring Provider (PT) Barton Fanny, NP    Prior Therapy yes for neck and back pain a year ago       Observation/Other Assessments   Focus on Therapeutic Outcomes (FOTO)  83% function      Ambulation/Gait   Ambulation/Gait Yes    Ambulation/Gait Assistance 7: Independent    Ambulation Distance (Feet) 415 Feet    Assistive device None    Gait velocity WNL    Gait Comments improved mid line orientation lumbar/thoracic alignment                         [facility] Adult PT Treatment/Exercise - 12/06/20 0001      Self-Care   Self-Care Posture   postural correction exercise recitation with patient demonstrating 100% HEP recall and spontaneous self-correction in static and dynamic positions/activities                 PT Education - 12/06/20 1516    Education Details Patient educated on continued postural correction exercises and updated on POC details    Person(s) Educated Patient    Methods Explanation    Comprehension Verbalized understanding;Returned demonstration            PT Short Term Goals - 12/06/20 1457      PT SHORT TERM GOAL #1   Title Patient will be independent in self management strategies to improve quality of life and functional outcomes.    Time 3    Period Weeks    Status Achieved    Target Date 12/02/20      PT SHORT TERM GOAL #2   Title Patient will report at least 25% improvement in overall symptoms and/or function to demonstrate improved functional mobility    Time 3    Period Weeks    Status Achieved    Target Date 12/02/20              PT Long Term Goals - 12/06/20 1458      PT LONG TERM GOAL #1   Title Patient will be able to sit in good posture for at least 60 seconds without verbal cues to improve overall posture endurance    Time 6    Period Weeks    Status Achieved      PT LONG TERM GOAL #2   Title Patient will report at least 50% improvement in overall symptoms and/or function to demonstrate improved functional mobility  Time 6    Period Weeks    Status Achieved      PT LONG TERM GOAL #3   Title Patient will be able to walk with fair posture and no tripping over feet to improve dynamic balance    Baseline (tripped over feet once with 2 minute walk)    Time 6    Period Weeks    Status Achieved                 Plan - 12/06/20 1518    Clinical Impression Statement Patient reports overall improvement in pain and function since start of care and reports 60% improvement since beginning of therapy and has been able to perform activities on his own and implement into daily routine.  Pt is agreeable to discontinue PT services at this time to self-managed HEP    Personal Factors and Comorbidities Comorbidity 1;Comorbidity 3+;Comorbidity 2    Comorbidities HTN, DB. CAD. COPD, L TKA, R shoulder scope    Examination-Activity Limitations Stand;Locomotion Level;Lift;Stairs    Examination-Participation Restrictions Community Activity    Stability/Clinical Decision Making Stable/Uncomplicated    Rehab Potential Good    PT Frequency Other (comment)   1-2x/week for total of 8 visits over 6 week certification   PT Duration 6 weeks    PT Treatment/Interventions ADLs/Self Care Home Management;Aquatic Therapy;Electrical Stimulation;Cryotherapy;Moist Heat;Traction;Balance training;Therapeutic exercise;Therapeutic activities;Stair training;Gait training;DME Instruction;Neuromuscular re-education;Patient/family education;Manual techniques;Passive range of motion;Dry needling;Joint Manipulations    PT Next Visit  Plan posture exercises, posterior chain strengthening and anterior stretching - cues to correct posture, trunk rotation    PT Home Exercise Plan posture (visual cues), cervical retraction    Consulted and Agree with Plan of Care Patient           Patient will benefit from skilled therapeutic intervention in order to improve the following deficits and impairments:  Pain,Postural dysfunction,Decreased range of motion,Decreased mobility,Decreased balance,Decreased activity tolerance,Decreased strength  Visit Diagnosis: Chronic midline low back pain without sciatica  Difficulty in walking, not elsewhere classified  Abnormal posture  Muscle weakness (generalized)     Problem List Patient Active Problem List   Diagnosis Date Noted  . Diverticulosis of colon without hemorrhage   . Solitary pulmonary nodule 02/10/2016  . Abnormal CT scan 02/02/2016  . Chronic GI bleeding 02/01/2016  . Anemia due to chronic blood loss 02/01/2016  . Colon polyps 01/04/2016  . 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 Gastric erosion with bleeding 01/02/2016  . Duodenal erosion 01/02/2016  . Tobacco abuse 01/01/2016  . Essential hypertension 01/01/2016  . Mucosal abnormality of stomach   . GI bleed 12/31/2015  . Diabetes mellitus without complication ([place])   . Hyperlipidemia   . BPH (benign prostatic hyperplasia)   . Carotid artery disease ([place])   . OSA on CPAP   . COPD (chronic obstructive pulmonary disease) ([place])     Toniann Fail 12/06/2020, 3:21 PM  [facility] [place], [place], [address] Phone: [phone]   Fax:  [phone]  Name: Todd George MRN: [id number] Date of Birth: January 24, 1941

## 2020-12-08 ENCOUNTER — Ambulatory Visit (HOSPITAL_COMMUNITY): Payer: Medicare HMO

## 2020-12-09 DIAGNOSIS — H524 Presbyopia: Secondary | ICD-10-CM | POA: Diagnosis not present

## 2020-12-13 ENCOUNTER — Ambulatory Visit (HOSPITAL_COMMUNITY): Payer: Medicare HMO | Admitting: Physical Therapy

## 2020-12-15 ENCOUNTER — Ambulatory Visit (HOSPITAL_COMMUNITY): Payer: Medicare HMO

## 2020-12-15 DIAGNOSIS — M419 Scoliosis, unspecified: Secondary | ICD-10-CM | POA: Diagnosis not present

## 2020-12-15 DIAGNOSIS — S40261A Insect bite (nonvenomous) of right shoulder, initial encounter: Secondary | ICD-10-CM | POA: Diagnosis not present

## 2020-12-15 DIAGNOSIS — E119 Type 2 diabetes mellitus without complications: Secondary | ICD-10-CM | POA: Diagnosis not present

## 2020-12-15 DIAGNOSIS — H3530 Unspecified macular degeneration: Secondary | ICD-10-CM | POA: Diagnosis not present

## 2020-12-15 DIAGNOSIS — Z0001 Encounter for general adult medical examination with abnormal findings: Secondary | ICD-10-CM | POA: Diagnosis not present

## 2020-12-15 DIAGNOSIS — F17210 Nicotine dependence, cigarettes, uncomplicated: Secondary | ICD-10-CM | POA: Diagnosis not present

## 2020-12-15 DIAGNOSIS — Z0189 Encounter for other specified special examinations: Secondary | ICD-10-CM | POA: Diagnosis not present

## 2020-12-15 DIAGNOSIS — E782 Mixed hyperlipidemia: Secondary | ICD-10-CM | POA: Diagnosis not present

## 2020-12-15 DIAGNOSIS — I10 Essential (primary) hypertension: Secondary | ICD-10-CM | POA: Diagnosis not present

## 2020-12-21 DIAGNOSIS — L0211 Cutaneous abscess of neck: Secondary | ICD-10-CM | POA: Diagnosis not present

## 2020-12-22 ENCOUNTER — Encounter (HOSPITAL_COMMUNITY): Payer: Medicare HMO

## 2020-12-23 ENCOUNTER — Encounter (HOSPITAL_COMMUNITY): Payer: Medicare HMO

## 2020-12-23 DIAGNOSIS — H353231 Exudative age-related macular degeneration, bilateral, with active choroidal neovascularization: Secondary | ICD-10-CM | POA: Diagnosis not present

## 2020-12-28 ENCOUNTER — Encounter (HOSPITAL_COMMUNITY): Payer: Medicare HMO

## 2020-12-28 DIAGNOSIS — F17210 Nicotine dependence, cigarettes, uncomplicated: Secondary | ICD-10-CM | POA: Diagnosis not present

## 2020-12-28 DIAGNOSIS — E119 Type 2 diabetes mellitus without complications: Secondary | ICD-10-CM | POA: Diagnosis not present

## 2020-12-28 DIAGNOSIS — H3530 Unspecified macular degeneration: Secondary | ICD-10-CM | POA: Diagnosis not present

## 2020-12-28 DIAGNOSIS — E782 Mixed hyperlipidemia: Secondary | ICD-10-CM | POA: Diagnosis not present

## 2020-12-28 DIAGNOSIS — L0211 Cutaneous abscess of neck: Secondary | ICD-10-CM | POA: Diagnosis not present

## 2020-12-28 DIAGNOSIS — Z0189 Encounter for other specified special examinations: Secondary | ICD-10-CM | POA: Diagnosis not present

## 2020-12-28 DIAGNOSIS — I10 Essential (primary) hypertension: Secondary | ICD-10-CM | POA: Diagnosis not present

## 2020-12-28 DIAGNOSIS — Z0001 Encounter for general adult medical examination with abnormal findings: Secondary | ICD-10-CM | POA: Diagnosis not present

## 2020-12-28 DIAGNOSIS — M419 Scoliosis, unspecified: Secondary | ICD-10-CM | POA: Diagnosis not present

## 2020-12-28 DIAGNOSIS — S40261A Insect bite (nonvenomous) of right shoulder, initial encounter: Secondary | ICD-10-CM | POA: Diagnosis not present

## 2020-12-30 ENCOUNTER — Encounter (HOSPITAL_COMMUNITY): Payer: Medicare HMO | Admitting: Physical Therapy

## 2021-01-20 DIAGNOSIS — H43813 Vitreous degeneration, bilateral: Secondary | ICD-10-CM | POA: Diagnosis not present

## 2021-01-20 DIAGNOSIS — H353231 Exudative age-related macular degeneration, bilateral, with active choroidal neovascularization: Secondary | ICD-10-CM | POA: Diagnosis not present

## 2021-01-20 DIAGNOSIS — H35371 Puckering of macula, right eye: Secondary | ICD-10-CM | POA: Diagnosis not present

## 2021-01-20 DIAGNOSIS — H35423 Microcystoid degeneration of retina, bilateral: Secondary | ICD-10-CM | POA: Diagnosis not present

## 2021-01-26 ENCOUNTER — Encounter (HOSPITAL_COMMUNITY): Payer: Medicare HMO

## 2021-02-01 ENCOUNTER — Ambulatory Visit: Admit: 2021-02-01 | Payer: Medicare HMO | Admitting: Orthopedic Surgery

## 2021-02-01 SURGERY — ARTHROPLASTY, KNEE, TOTAL
Anesthesia: Spinal | Site: Knee | Laterality: Right

## 2021-02-23 DIAGNOSIS — M419 Scoliosis, unspecified: Secondary | ICD-10-CM | POA: Diagnosis not present

## 2021-02-23 DIAGNOSIS — H3530 Unspecified macular degeneration: Secondary | ICD-10-CM | POA: Diagnosis not present

## 2021-02-23 DIAGNOSIS — Z0189 Encounter for other specified special examinations: Secondary | ICD-10-CM | POA: Diagnosis not present

## 2021-02-23 DIAGNOSIS — S40261A Insect bite (nonvenomous) of right shoulder, initial encounter: Secondary | ICD-10-CM | POA: Diagnosis not present

## 2021-02-23 DIAGNOSIS — E782 Mixed hyperlipidemia: Secondary | ICD-10-CM | POA: Diagnosis not present

## 2021-02-23 DIAGNOSIS — Z0001 Encounter for general adult medical examination with abnormal findings: Secondary | ICD-10-CM | POA: Diagnosis not present

## 2021-02-23 DIAGNOSIS — E119 Type 2 diabetes mellitus without complications: Secondary | ICD-10-CM | POA: Diagnosis not present

## 2021-02-23 DIAGNOSIS — F17210 Nicotine dependence, cigarettes, uncomplicated: Secondary | ICD-10-CM | POA: Diagnosis not present

## 2021-02-23 DIAGNOSIS — I10 Essential (primary) hypertension: Secondary | ICD-10-CM | POA: Diagnosis not present

## 2021-02-24 DIAGNOSIS — H353231 Exudative age-related macular degeneration, bilateral, with active choroidal neovascularization: Secondary | ICD-10-CM | POA: Diagnosis not present

## 2021-02-28 DIAGNOSIS — E1169 Type 2 diabetes mellitus with other specified complication: Secondary | ICD-10-CM | POA: Diagnosis not present

## 2021-02-28 DIAGNOSIS — N3281 Overactive bladder: Secondary | ICD-10-CM | POA: Diagnosis not present

## 2021-02-28 DIAGNOSIS — E782 Mixed hyperlipidemia: Secondary | ICD-10-CM | POA: Diagnosis not present

## 2021-02-28 DIAGNOSIS — M1711 Unilateral primary osteoarthritis, right knee: Secondary | ICD-10-CM | POA: Diagnosis not present

## 2021-02-28 DIAGNOSIS — R140 Abdominal distension (gaseous): Secondary | ICD-10-CM | POA: Diagnosis not present

## 2021-02-28 DIAGNOSIS — J449 Chronic obstructive pulmonary disease, unspecified: Secondary | ICD-10-CM | POA: Diagnosis not present

## 2021-02-28 DIAGNOSIS — I10 Essential (primary) hypertension: Secondary | ICD-10-CM | POA: Diagnosis not present

## 2021-02-28 DIAGNOSIS — G2581 Restless legs syndrome: Secondary | ICD-10-CM | POA: Diagnosis not present

## 2021-02-28 DIAGNOSIS — F17218 Nicotine dependence, cigarettes, with other nicotine-induced disorders: Secondary | ICD-10-CM | POA: Diagnosis not present

## 2021-03-19 ENCOUNTER — Encounter (HOSPITAL_COMMUNITY): Payer: Self-pay | Admitting: Emergency Medicine

## 2021-03-19 ENCOUNTER — Emergency Department (HOSPITAL_COMMUNITY): Payer: Medicare HMO

## 2021-03-19 ENCOUNTER — Emergency Department (HOSPITAL_COMMUNITY)
Admission: EM | Admit: 2021-03-19 | Discharge: 2021-03-19 | Disposition: A | Discharge status: Home / Self Care | Payer: Medicare HMO | Attending: Emergency Medicine | Admitting: Emergency Medicine

## 2021-03-19 ENCOUNTER — Other Ambulatory Visit: Payer: Self-pay

## 2021-03-19 DIAGNOSIS — S99921A Unspecified injury of right foot, initial encounter: Secondary | ICD-10-CM | POA: Diagnosis present

## 2021-03-19 DIAGNOSIS — Z23 Encounter for immunization: Secondary | ICD-10-CM | POA: Insufficient documentation

## 2021-03-19 DIAGNOSIS — Z96652 Presence of left artificial knee joint: Secondary | ICD-10-CM | POA: Insufficient documentation

## 2021-03-19 DIAGNOSIS — S91311A Laceration without foreign body, right foot, initial encounter: Secondary | ICD-10-CM | POA: Insufficient documentation

## 2021-03-19 DIAGNOSIS — I2510 Atherosclerotic heart disease of native coronary artery without angina pectoris: Secondary | ICD-10-CM | POA: Insufficient documentation

## 2021-03-19 DIAGNOSIS — E119 Type 2 diabetes mellitus without complications: Secondary | ICD-10-CM | POA: Insufficient documentation

## 2021-03-19 DIAGNOSIS — Z79899 Other long term (current) drug therapy: Secondary | ICD-10-CM | POA: Insufficient documentation

## 2021-03-19 DIAGNOSIS — S91111A Laceration without foreign body of right great toe without damage to nail, initial encounter: Secondary | ICD-10-CM | POA: Diagnosis not present

## 2021-03-19 DIAGNOSIS — Z7984 Long term (current) use of oral hypoglycemic drugs: Secondary | ICD-10-CM | POA: Insufficient documentation

## 2021-03-19 DIAGNOSIS — F17200 Nicotine dependence, unspecified, uncomplicated: Secondary | ICD-10-CM | POA: Diagnosis not present

## 2021-03-19 DIAGNOSIS — Z85828 Personal history of other malignant neoplasm of skin: Secondary | ICD-10-CM | POA: Diagnosis not present

## 2021-03-19 DIAGNOSIS — W25XXXA Contact with sharp glass, initial encounter: Secondary | ICD-10-CM | POA: Diagnosis not present

## 2021-03-19 DIAGNOSIS — J449 Chronic obstructive pulmonary disease, unspecified: Secondary | ICD-10-CM | POA: Insufficient documentation

## 2021-03-19 MED ORDER — LIDOCAINE HCL (PF) 1 % IJ SOLN
5.0000 mL | Freq: Once | INTRAMUSCULAR | Status: DC
  Filled 2021-03-19: qty 30

## 2021-03-19 MED ORDER — TETANUS-DIPHTH-ACELL PERTUSSIS 5-2.5-18.5 LF-MCG/0.5 IM SUSY
0.5000 mL | PREFILLED_SYRINGE | Freq: Once | INTRAMUSCULAR | Status: AC
  Administered 2021-03-19: 0.5 mL via INTRAMUSCULAR
  Filled 2021-03-19: qty 0.5

## 2021-03-19 MED ORDER — POVIDONE-IODINE 10 % OINT PACKET
TOPICAL_OINTMENT | Freq: Once | CUTANEOUS | Status: DC
  Filled 2021-03-19: qty 2

## 2021-03-19 NOTE — ED Notes (Signed)
Pt taken to xray 

## 2021-03-19 NOTE — ED Provider Notes (Signed)
[facility] EMERGENCY DEPARTMENT Provider Note   CSN: [id number] Arrival date & time: 03/19/21  1630     History Chief Complaint  Patient presents with  . Extremity Laceration    Todd George is a 80 y.o. male.  HPI Patient is an 80 year old male who presents the emergency department due to a laceration to the right great toe.  This occurred prior to arrival.  Patient was removing a glass bowl from the refrigerator and dropped it causing it to break.  Shard of glass caused the laceration.  Patient reports moderate bleeding from the site that has since resolved.  He has mild pain in the region that worsens with flexion of the toe.  No numbness, tingling, or weakness.  He is unsure of the timing of his last Tdap.    Past Medical History:  Diagnosis Date  . Arthritis   . 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 BPH (benign prostatic hyperplasia)   . Cancer ([place])    skin cancer  . Carotid artery disease ([place])   . Cellulitis   . Chronic back pain   . Colon polyps 01/04/2016  . COPD (chronic obstructive pulmonary disease) ([place])    2ppd  . Diabetes mellitus without complication ([place])   . Duodenal erosion 01/02/2016  . Gastric erosion with bleeding 01/02/2016  . Hyperlipidemia   . Hypertension   . OSA on CPAP    not used machine in over 10 yrs    Patient Active Problem List   Diagnosis Date Noted  . Diverticulosis of colon without hemorrhage   . Solitary pulmonary nodule 02/10/2016  . Abnormal CT scan 02/02/2016  . Chronic GI bleeding 02/01/2016  . Anemia due to chronic blood loss 02/01/2016  . Colon polyps 01/04/2016  . 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 Gastric erosion with bleeding 01/02/2016  . Duodenal erosion 01/02/2016  . Tobacco abuse 01/01/2016  . Essential hypertension 01/01/2016  . Mucosal abnormality of stomach   . GI bleed 12/31/2015  . Diabetes mellitus without complication ([place])   .  Hyperlipidemia   . BPH (benign prostatic hyperplasia)   . Carotid artery disease ([place])   . OSA on CPAP   . COPD (chronic obstructive pulmonary disease) ([place])     Past Surgical History:  Procedure Laterality Date  . CHOLECYSTECTOMY    . COLONOSCOPY N/A 01/03/2016   RMR: Active bleeding seen arising from the small bowel. Multiple cecal/ascending colon AVMs ablated. 1.5 cm carpet polyp in the ascending segment status post piecemeal snare polypectomy and tattooing. Innocent appearing colonic diverticulosis.  . COLONOSCOPY N/A 06/14/2016   Procedure: COLONOSCOPY;  Surgeon: Daneil Dolin, MD;  [facility];  Service: Endoscopy;  Laterality: N/A;  1200  . COLONOSCOPY WITH PROPOFOL N/A 11/28/2018   Procedure: COLONOSCOPY WITH PROPOFOL;  Surgeon: Daneil Dolin, MD;  [facility];  Service: Endoscopy;  Laterality: N/A;  8:30am  . ESOPHAGOGASTRODUODENOSCOPY N/A 01/01/2016   RMR: Multiple gastric and duodenal erosions likely NSAID related.  Marland Kitchen GIVENS CAPSULE STUDY N/A 01/03/2016   Incomplete study, capsule never reach cecum. Scattered erosions and AVMs throughout the small bowel.  Marland Kitchen JOINT REPLACEMENT    . POLYPECTOMY  11/28/2018   Procedure: POLYPECTOMY;  Surgeon: Daneil Dolin, MD;  [facility];  Service: Endoscopy;;  ascending colon  . REPLACEMENT TOTAL KNEE Left   . SHOULDER ARTHROSCOPY WITH DISTAL CLAVICLE RESECTION Right 05/28/2015   Procedure: SHOULDER ARTHROSCOPY  WITH DISTAL CLAVICLE RESECTION;  Surgeon: Melrose Nakayama, MD;  Location: [facility];  Service: Orthopedics;  Laterality: Right;  . SHOULDER ARTHROSCOPY WITH SUBACROMIAL DECOMPRESSION Right 05/28/2015   Procedure: SHOULDER ARTHROSCOPY WITH SUBACROMIAL DECOMPRESSION;  Surgeon: Melrose Nakayama, MD;  Location: [facility];  Service: Orthopedics;  Laterality: Right;  . [place]       History reviewed. No pertinent family history.  Social History   Tobacco Use  .  Smoking status: Current Every Day Smoker    Packs/day: 2.00    Years: 64.00    Pack years: 128.00  . Smokeless tobacco: Never Used  Vaping Use  . Vaping Use: Every day  . Start date: 08/20/2018  Substance Use Topics  . Alcohol use: Yes    Alcohol/week: 0.0 standard drinks    Comment: rare  . Drug use: No    Home Medications Prior to Admission medications   Medication Sig Start Date End Date Taking? Authorizing Provider  acetaminophen (TYLENOL) 500 MG tablet Take 500-1,000 mg by mouth every 6 (six) hours as needed for moderate pain or headache.    [provider]  clotrimazole-betamethasone (LOTRISONE) cream Apply 1 application topically daily as needed (for jock itch).     [provider]  Colloidal Oatmeal (GOLD BOND ECZEMA RELIEF) 2 % CREA Apply 1 application topically daily as needed (for eczema).    [provider]  HYDROcodone-acetaminophen (NORCO) 7.5-325 MG tablet Take 1 tablet by mouth daily as needed for moderate pain.     [provider]  hydrOXYzine (ATARAX/VISTARIL) 25 MG tablet Take 50 mg by mouth every 6 (six) hours as needed for itching.    [provider]  lisinopril-hydrochlorothiazide (PRINZIDE,ZESTORETIC) 20-12.5 MG tablet Take 1 tablet by mouth daily.  05/16/17   [provider]  losartan (COZAAR) 50 MG tablet Take 50 mg by mouth daily.  04/30/17   [provider]  metFORMIN (GLUCOPHAGE) 500 MG tablet Take 500 mg by mouth daily.     [provider]  metoprolol succinate (TOPROL-XL) 25 MG 24 hr tablet Take 25 mg by mouth daily.    [provider]  Multiple Vitamins-Minerals (PRESERVISION AREDS 2) CAPS Take 1 capsule by mouth 2 (two) times daily.     [provider]  mupirocin ointment (BACTROBAN) 2 % Apply 1 application topically daily as needed (for infection).    [provider]  oxybutynin (DITROPAN) 5 MG tablet Take 5 mg by mouth daily. 09/02/18   [provider]   pregabalin (LYRICA) 50 MG capsule Take 50 mg by mouth daily.    [provider]  psyllium (METAMUCIL) 58.6 % powder Take 1 packet by mouth 3 (three) times daily.    [provider]  simvastatin (ZOCOR) 20 MG tablet Take 20 mg by mouth daily.    [provider]  tamsulosin (FLOMAX) 0.4 MG CAPS capsule Take 0.4 mg by mouth daily.     [provider]  traMADol (ULTRAM) 50 MG tablet Take 50 mg by mouth daily as needed for moderate pain.    [provider]    Allergies    Nsaids  Review of Systems   Review of Systems  Skin: Positive for wound. Negative for color change.  Neurological: Negative for weakness and numbness.   Physical Exam Updated Vital Signs BP (!) 135/59 (BP Location: Left Arm)   Pulse 63   Temp 97.9 F (36.6 C) (Oral)   Resp 17  Ht 5\' 10"  (1.778 m)   Wt 93.4 kg   SpO2 97%   BMI 29.56 kg/m   Physical Exam Vitals and nursing note reviewed.  Constitutional:      General: He is not in acute distress.    Appearance: He is well-developed.  HENT:     Head: Normocephalic and atraumatic.     Right Ear: External ear normal.     Left Ear: External ear normal.  Eyes:     General: No scleral icterus.       Right eye: No discharge.        Left eye: No discharge.     Conjunctiva/sclera: Conjunctivae normal.  Neck:     Trachea: No tracheal deviation.  Cardiovascular:     Rate and Rhythm: Normal rate.  Pulmonary:     Effort: Pulmonary effort is normal. No respiratory distress.     Breath sounds: No stridor.  Abdominal:     General: There is no distension.  Musculoskeletal:        General: No swelling or deformity.     Cervical back: Neck supple.  Skin:    General: Skin is warm and dry.     Findings: No rash.     Comments: 3 cm linear well approximated laceration noted across the dorsal aspect of the right great toe.  Full range of motion of the toe.  Distal sensation intact.  Good cap refill.  Palpable DP pulses.   Neurological:     Mental Status: He is alert.     Cranial Nerves: Cranial nerve deficit: no gross deficits.     ED Results / Procedures / Treatments   Labs (all labs ordered are listed, but only abnormal results are displayed) Labs Reviewed - No data to display  EKG None  Radiology DG Foot Complete Right  Result Date: 03/19/2021 CLINICAL DATA:  Laceration from broken glass EXAM: RIGHT FOOT COMPLETE - 3+ VIEW COMPARISON:  None. FINDINGS: Frontal, oblique, and lateral views of the right foot are obtained. No fracture, subluxation, or dislocation. Joint spaces are well preserved. Soft tissues are unremarkable. No radiopaque foreign body. IMPRESSION: 1. No fracture or radiopaque foreign body. Electronically Signed   By: Randa Ngo M.D.   On: 03/19/2021 17:28    Procedures .Marland KitchenLaceration Repair  Date/Time: 03/19/2021 5:55 PM Performed by: Rayna Sexton, PA-C Authorized by: Rayna Sexton, PA-C   Consent:    Consent obtained:  Verbal   Consent given by:  Patient   Risks discussed:  Infection, need for additional repair, pain, poor cosmetic result and poor wound healing   Alternatives discussed:  No treatment and delayed treatment Universal protocol:    Procedure explained and questions answered to patient or proxy's satisfaction: yes     Relevant documents present and verified: yes     Test results available: yes     Imaging studies available: yes     Required blood products, implants, devices, and special equipment available: yes     Site/side marked: yes     Immediately prior to procedure, a time out was called: yes     Patient identity confirmed:  Verbally with patient Anesthesia:    Anesthesia method:  Local infiltration   Local anesthetic:  Lidocaine 1% w/o epi Laceration details:    Location:  Toe   Toe location:  R big toe   Length (cm):  3 Pre-procedure details:    Preparation:  Patient was prepped and draped in usual sterile fashion Exploration:  Imaging obtained: x-ray     Imaging outcome: foreign body not noted     Wound exploration: wound explored through full range of motion     Contaminated: no   Treatment:    Area cleansed with:  Povidone-iodine and saline   Amount of cleaning:  Extensive   Irrigation method:  Pressure wash and syringe   Visualized foreign bodies/material removed: no     Debridement:  None   Undermining:  None   Scar revision: no   Skin repair:    Repair method:  Sutures   Suture size:  4-0   Suture material:  Prolene   Suture technique: 2 simple interrupted as well as 2 horizontal mattress.   Number of sutures:  4 Approximation:    Approximation:  Close Repair type:    Repair type:  Simple Post-procedure details:    Dressing:  Non-adherent dressing   Procedure completion:  Tolerated well, no immediate complications    Medications Ordered in ED Medications  lidocaine (PF) (XYLOCAINE) 1 % injection 5 mL (5 mLs Infiltration Handoff 03/19/21 1728)  povidone-iodine (BETADINE) 10 % ointment (1 application Topical Handoff 03/19/21 1729)  Tdap (BOOSTRIX) injection 0.5 mL (0.5 mLs Intramuscular Given 03/19/21 1725)   ED Course  I have reviewed the triage vital signs and the nursing notes.  Pertinent labs & imaging results that were available during my care of the patient were reviewed by me and considered in my medical decision making (see chart for details).    MDM Rules/Calculators/A&P                          Patient is an 80 year old male who presents to the emergency department due to a laceration to the right great toe.  Neurovascularly intact in the toe.  X-ray obtained which is negative for fracture or foreign body.  Wound cleaned extensively by myself as well as the nursing staff.  Closure was performed with 4-0 Prolene sutures using both a combination of simple interrupted as well as horizontal mattress.  Tdap updated in the ED.  Discussed wound care in length.  Tylenol for management of his  pain.  Ice and elevation.  Suture removal in 12 to 14 days.  Discussed signs or symptoms of infection.  His questions were answered and he was amicable to time of discharge.  Final Clinical Impression(s) / ED Diagnoses Final diagnoses:  Laceration of right foot, initial encounter   Rx / DC Orders ED Discharge Orders    None       Rayna Sexton, PA-C 03/19/21 1800    Noemi Chapel, MD 03/20/21 1547

## 2021-03-19 NOTE — Discharge Instructions (Addendum)
I would recommend applying antibiotic ointment to the wound 1-2 times per day.  I recommend triple antibiotic.  If you are working outside or moving around, I would recommend you keep the toe covered with a bandage during the day.  Please remove it at night and wash it with a small amount of soap and water.  There is no need to apply hydrogen peroxide or alcohol to the wound.  If you develop any signs or symptoms of infection such as increased redness, swelling, or pus draining from the site, please return to the emergency department immediately for reevaluation.  Please make sure that you have your stitches removed in 12 to 14 days.  It was a pleasure to meet you.

## 2021-03-19 NOTE — ED Notes (Signed)
Pt A&Ox3, RR even and nonlabored. Dropped glass bowel on his right foot, linear laceration across top of greater toe. Bleeding currently controlled. +pedal pulse and sensation. Able to move toes appropriately. Dried blood cleaned from foot, no additional wounds noted.

## 2021-03-19 NOTE — ED Notes (Signed)
Pt unable to sign electronic discharge form, pad is not functioning. Pt is aware of DC instructions, wound care. Aware of s/s to watch for in case he needs to return. Aware he needs to have sutures removed in 12-14 days. Clean and intact dressing in place by PA.

## 2021-03-19 NOTE — ED Triage Notes (Signed)
Pt dropped a glass bowl and has a laceration on his rt greater toe. Bleeding controlled.

## 2021-03-31 DIAGNOSIS — H353231 Exudative age-related macular degeneration, bilateral, with active choroidal neovascularization: Secondary | ICD-10-CM | POA: Diagnosis not present

## 2021-03-31 DIAGNOSIS — H43813 Vitreous degeneration, bilateral: Secondary | ICD-10-CM | POA: Diagnosis not present

## 2021-03-31 DIAGNOSIS — H35371 Puckering of macula, right eye: Secondary | ICD-10-CM | POA: Diagnosis not present

## 2021-03-31 DIAGNOSIS — H35423 Microcystoid degeneration of retina, bilateral: Secondary | ICD-10-CM | POA: Diagnosis not present

## 2021-04-06 DIAGNOSIS — Z4802 Encounter for removal of sutures: Secondary | ICD-10-CM | POA: Diagnosis not present

## 2021-05-05 DIAGNOSIS — H353231 Exudative age-related macular degeneration, bilateral, with active choroidal neovascularization: Secondary | ICD-10-CM | POA: Diagnosis not present

## 2021-05-18 DIAGNOSIS — H353233 Exudative age-related macular degeneration, bilateral, with inactive scar: Secondary | ICD-10-CM | POA: Diagnosis not present

## 2021-05-18 DIAGNOSIS — E119 Type 2 diabetes mellitus without complications: Secondary | ICD-10-CM | POA: Diagnosis not present

## 2021-05-18 DIAGNOSIS — Z961 Presence of intraocular lens: Secondary | ICD-10-CM | POA: Diagnosis not present

## 2021-05-18 DIAGNOSIS — H524 Presbyopia: Secondary | ICD-10-CM | POA: Diagnosis not present

## 2021-05-18 DIAGNOSIS — Z0100 Encounter for examination of eyes and vision without abnormal findings: Secondary | ICD-10-CM | POA: Diagnosis not present

## 2021-06-09 DIAGNOSIS — H353231 Exudative age-related macular degeneration, bilateral, with active choroidal neovascularization: Secondary | ICD-10-CM | POA: Diagnosis not present

## 2021-07-14 DIAGNOSIS — H353231 Exudative age-related macular degeneration, bilateral, with active choroidal neovascularization: Secondary | ICD-10-CM | POA: Diagnosis not present

## 2021-07-14 DIAGNOSIS — H35371 Puckering of macula, right eye: Secondary | ICD-10-CM | POA: Diagnosis not present

## 2021-07-14 DIAGNOSIS — H43822 Vitreomacular adhesion, left eye: Secondary | ICD-10-CM | POA: Diagnosis not present

## 2021-07-14 DIAGNOSIS — H43813 Vitreous degeneration, bilateral: Secondary | ICD-10-CM | POA: Diagnosis not present

## 2021-07-19 DIAGNOSIS — I10 Essential (primary) hypertension: Secondary | ICD-10-CM | POA: Diagnosis not present

## 2021-07-19 DIAGNOSIS — E782 Mixed hyperlipidemia: Secondary | ICD-10-CM | POA: Diagnosis not present

## 2021-07-19 DIAGNOSIS — E119 Type 2 diabetes mellitus without complications: Secondary | ICD-10-CM | POA: Diagnosis not present

## 2021-07-22 DIAGNOSIS — M1711 Unilateral primary osteoarthritis, right knee: Secondary | ICD-10-CM | POA: Diagnosis not present

## 2021-07-22 DIAGNOSIS — Z96652 Presence of left artificial knee joint: Secondary | ICD-10-CM | POA: Diagnosis not present

## 2021-07-26 DIAGNOSIS — J449 Chronic obstructive pulmonary disease, unspecified: Secondary | ICD-10-CM | POA: Diagnosis not present

## 2021-07-26 DIAGNOSIS — G2581 Restless legs syndrome: Secondary | ICD-10-CM | POA: Diagnosis not present

## 2021-07-26 DIAGNOSIS — R944 Abnormal results of kidney function studies: Secondary | ICD-10-CM | POA: Diagnosis not present

## 2021-07-26 DIAGNOSIS — E1169 Type 2 diabetes mellitus with other specified complication: Secondary | ICD-10-CM | POA: Diagnosis not present

## 2021-07-26 DIAGNOSIS — M1711 Unilateral primary osteoarthritis, right knee: Secondary | ICD-10-CM | POA: Diagnosis not present

## 2021-07-26 DIAGNOSIS — R011 Cardiac murmur, unspecified: Secondary | ICD-10-CM | POA: Diagnosis not present

## 2021-07-26 DIAGNOSIS — E782 Mixed hyperlipidemia: Secondary | ICD-10-CM | POA: Diagnosis not present

## 2021-07-26 DIAGNOSIS — I10 Essential (primary) hypertension: Secondary | ICD-10-CM | POA: Diagnosis not present

## 2021-07-26 DIAGNOSIS — F17200 Nicotine dependence, unspecified, uncomplicated: Secondary | ICD-10-CM | POA: Diagnosis not present

## 2021-08-11 ENCOUNTER — Telehealth: Payer: Self-pay | Admitting: Cardiology

## 2021-08-11 NOTE — Telephone Encounter (Signed)
   [facility] Pre-operative Risk Assessment    Patient Name: Todd George  DOB: Jan 22, 1941 MRN: [id number]  HEARTCARE STAFF:  - IMPORTANT!!!!!! Under Visit Info/Reason for Call, type in Other and utilize the format Clearance MM/DD/YY or Clearance TBD. Do not use dashes or single digits. - Please review there is not already an duplicate clearance open for this procedure. - If request is for dental extraction, please clarify the # of teeth to be extracted. - If the patient is currently at the dentist's office, call Pre-Op Callback Staff (MA/nurse) to input urgent request.  - If the patient is not currently in the dentist office, please route to the Pre-Op pool.  Request for surgical clearance:  What type of surgery is being performed? RT TKA  When is this surgery scheduled? 09/06/21  What type of clearance is required (medical clearance vs. Pharmacy clearance to hold med vs. Both)? both  Are there any medications that need to be held prior to surgery and how long? Please advise   Practice name and name of physician performing surgery? Dr Elliot Cousin  What is the office phone number?  [phone]   7.   What is the office fax number? [phone]   8.   Anesthesia type (None, local, MAC, general) ? general   Jannet Askew 08/11/2021, 2:35 PM  _________________________________________________________________   (provider comments below)

## 2021-08-12 NOTE — Telephone Encounter (Signed)
Pt already has appt scheduled 8-31 at 1020am

## 2021-08-12 NOTE — Telephone Encounter (Signed)
Primary Cardiologist:None  Chart reviewed as part of pre-operative protocol coverage. Because of Todd George past medical history and time since last visit, he/she will require a follow-up visit in order to better assess preoperative cardiovascular risk.  Pre-op covering staff: - Please schedule appointment and call patient to inform them. - Please contact requesting surgeon's office via preferred method (i.e, phone, fax) to inform them of need for appointment prior to surgery.  If applicable, this message will also be routed to pharmacy pool and/or primary cardiologist for input on holding anticoagulant/antiplatelet agent as requested below so that this information is available at time of patient's appointment.   Deberah Pelton, NP  08/12/2021, 9:46 AM

## 2021-08-18 DIAGNOSIS — H43813 Vitreous degeneration, bilateral: Secondary | ICD-10-CM | POA: Diagnosis not present

## 2021-08-18 DIAGNOSIS — H353231 Exudative age-related macular degeneration, bilateral, with active choroidal neovascularization: Secondary | ICD-10-CM | POA: Diagnosis not present

## 2021-08-18 DIAGNOSIS — H35371 Puckering of macula, right eye: Secondary | ICD-10-CM | POA: Diagnosis not present

## 2021-08-18 DIAGNOSIS — H43822 Vitreomacular adhesion, left eye: Secondary | ICD-10-CM | POA: Diagnosis not present

## 2021-08-24 ENCOUNTER — Ambulatory Visit (INDEPENDENT_AMBULATORY_CARE_PROVIDER_SITE_OTHER): Payer: Medicare HMO | Admitting: Cardiology

## 2021-08-24 ENCOUNTER — Other Ambulatory Visit: Payer: Self-pay

## 2021-08-24 ENCOUNTER — Encounter: Payer: Self-pay | Admitting: Cardiology

## 2021-08-24 VITALS — BP 158/70 | HR 56 | Wt 201.0 lb

## 2021-08-24 DIAGNOSIS — R011 Cardiac murmur, unspecified: Secondary | ICD-10-CM

## 2021-08-24 DIAGNOSIS — Z01810 Encounter for preprocedural cardiovascular examination: Secondary | ICD-10-CM | POA: Diagnosis not present

## 2021-08-24 DIAGNOSIS — I10 Essential (primary) hypertension: Secondary | ICD-10-CM | POA: Diagnosis not present

## 2021-08-24 NOTE — Patient Instructions (Addendum)

## 2021-08-24 NOTE — Progress Notes (Addendum)
Clinical Summary Todd George is a 80 y.o.male Seen today as a new consult, referred by Dr Nevada Crane for the following medical problems.   1.Preoperative evaluation - considering knee replacement - walks about 1 mile daily, no chest pain or DOE - can walk up 2 flights of stairs without issues - no recent edema    2. Heart murmur - noted by pcp   3. 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   BPH (benign prostatic hyperplasia)    Cancer (HCC)    skin cancer   Carotid artery disease (HCC)    Cellulitis    Chronic back pain    Colon polyps 01/04/2016   COPD (chronic obstructive pulmonary disease) ([place])    2ppd   Diabetes mellitus without complication ([place])    Duodenal erosion 01/02/2016   Gastric erosion with bleeding 01/02/2016   Hyperlipidemia    Hypertension    OSA on CPAP    not used machine in over 10 yrs     Allergies  Allergen Reactions   Nsaids Other (See Comments)    Recurrent GI bleed     Current Outpatient Medications  Medication Sig Dispense Refill   acetaminophen (TYLENOL) 500 MG tablet Take 500-1,000 mg by mouth every 6 (six) hours as needed for moderate pain or headache.     clotrimazole-betamethasone (LOTRISONE) cream Apply 1 application topically daily as needed (for jock itch).      Colloidal Oatmeal (GOLD BOND ECZEMA RELIEF) 2 % CREA Apply 1 application topically daily as needed (for eczema).     HYDROcodone-acetaminophen (NORCO) 7.5-325 MG tablet Take 1 tablet by mouth daily as needed for moderate pain.      hydrOXYzine (ATARAX/VISTARIL) 25 MG tablet Take 50 mg by mouth every 6 (six) hours as needed for itching.     lisinopril-hydrochlorothiazide (PRINZIDE,ZESTORETIC) 20-12.5 MG tablet Take 1 tablet by mouth daily.      losartan (COZAAR) 50 MG tablet Take 50 mg by mouth daily.      metFORMIN (GLUCOPHAGE) 500 MG tablet Take 500 mg by mouth daily.       metoprolol succinate (TOPROL-XL) 25 MG 24 hr tablet Take 25 mg by mouth daily.     Multiple Vitamins-Minerals (PRESERVISION AREDS 2) CAPS Take 1 capsule by mouth 2 (two) times daily.      mupirocin ointment (BACTROBAN) 2 % Apply 1 application topically daily as needed (for infection).     oxybutynin (DITROPAN) 5 MG tablet Take 5 mg by mouth daily.     pregabalin (LYRICA) 50 MG capsule Take 50 mg by mouth daily.     psyllium (METAMUCIL) 58.6 % powder Take 1 packet by mouth 3 (three) times daily.     simvastatin (ZOCOR) 20 MG tablet Take 20 mg by mouth daily.     tamsulosin (FLOMAX) 0.4 MG CAPS capsule Take 0.4 mg by mouth daily.      traMADol (ULTRAM) 50 MG tablet Take 50 mg by mouth daily as needed for moderate pain.     No current facility-administered medications for this visit.     Past Surgical History:  Procedure Laterality Date   CHOLECYSTECTOMY     COLONOSCOPY N/A 01/03/2016   RMR: Active bleeding seen arising from the small bowel. Multiple cecal/ascending colon AVMs ablated. 1.5 cm carpet polyp in the ascending segment status post piecemeal snare polypectomy and tattooing. Innocent appearing colonic  diverticulosis.   COLONOSCOPY N/A 06/14/2016   Procedure: COLONOSCOPY;  Surgeon: Daneil Dolin, MD;  [facility];  Service: Endoscopy;  Laterality: N/A;  1200   COLONOSCOPY WITH PROPOFOL N/A 11/28/2018   Procedure: COLONOSCOPY WITH PROPOFOL;  Surgeon: Daneil Dolin, MD;  [facility];  Service: Endoscopy;  Laterality: N/A;  8:30am   ESOPHAGOGASTRODUODENOSCOPY N/A 01/01/2016   RMR: Multiple gastric and duodenal erosions likely NSAID related.   GIVENS CAPSULE STUDY N/A 01/03/2016   Incomplete study, capsule never reach cecum. Scattered erosions and AVMs throughout the small bowel.   JOINT REPLACEMENT     POLYPECTOMY  11/28/2018   Procedure: POLYPECTOMY;  Surgeon: Daneil Dolin, MD;  [facility];  Service: Endoscopy;;  ascending colon   REPLACEMENT TOTAL  KNEE Left    SHOULDER ARTHROSCOPY WITH DISTAL CLAVICLE RESECTION Right 05/28/2015   Procedure: SHOULDER ARTHROSCOPY WITH DISTAL CLAVICLE RESECTION;  Surgeon: Melrose Nakayama, MD;  Location: [facility];  Service: Orthopedics;  Laterality: Right;   SHOULDER ARTHROSCOPY WITH SUBACROMIAL DECOMPRESSION Right 05/28/2015   Procedure: SHOULDER ARTHROSCOPY WITH SUBACROMIAL DECOMPRESSION;  Surgeon: Melrose Nakayama, MD;  Location: [facility];  Service: Orthopedics;  Laterality: Right;   THROAT SURGERY  1965     Allergies  Allergen Reactions   Nsaids Other (See Comments)    Recurrent GI bleed      No family history on file.   Social History Todd George reports that he has been smoking. He has a 128.00 pack-year smoking history. He has never used smokeless tobacco. Todd George reports current alcohol use.   Review of Systems CONSTITUTIONAL: No weight loss, fever, chills, weakness or fatigue.  HEENT: Eyes: No visual loss, blurred vision, double vision or yellow sclerae.No hearing loss, sneezing, congestion, runny nose or sore throat.  SKIN: No rash or itching.  CARDIOVASCULAR: per hpi RESPIRATORY: No shortness of breath, cough or sputum.  GASTROINTESTINAL: No anorexia, nausea, vomiting or diarrhea. No abdominal pain or blood.  GENITOURINARY: No burning on urination, no polyuria NEUROLOGICAL: No headache, dizziness, syncope, paralysis, ataxia, numbness or tingling in the extremities. No change in bowel or bladder control.  MUSCULOSKELETAL: No muscle, back pain, joint pain or stiffness.  LYMPHATICS: No enlarged nodes. No history of splenectomy.  PSYCHIATRIC: No history of depression or anxiety.  ENDOCRINOLOGIC: No reports of sweating, cold or heat intolerance. No polyuria or polydipsia.  Marland Kitchen   Physical Examination Today's Vitals   08/24/21 1021  BP: (!) 158/70  Pulse: (!) 56  SpO2: 96%  Weight: 201 lb (91.2 kg)   Body mass index is 28.84 kg/m.  Gen: resting  comfortably, no acute distress HEENT: no scleral icterus, pupils equal round and reactive, no palptable cervical adenopathy,  CV: RRR, 2/6 systolic murmur RUSB, no jvd Resp: Clear to auscultation bilaterally GI: abdomen is soft, non-tender, non-distended, normal bowel sounds, no hepatosplenomegaly MSK: extremities are warm, no edema.  Skin: warm, no rash Neuro:  no focal deficits Psych: appropriate affect     Assessment and Plan  Heart murmur -will obtain echo to further evaluate - no significant symptoms  2. Preoperative evaluation - considering knee replacement - tolerates greater than 4METs without cardiopulmonary symptoms - would anticipate proceeding with surgery pending echo results regarding heart murmur  EKG to[place]y shows sinus brady at 53, no ischemic changes  F/u pending echo results   08/01/2021 echo shows mild to mod AS, normal LVEF. Recommend proceeding with surgery as planned from cardiac standpoint.  F/u with cards clinic 6 months  Arnoldo Lenis, M.D

## 2021-08-26 ENCOUNTER — Other Ambulatory Visit: Payer: Self-pay

## 2021-08-26 ENCOUNTER — Ambulatory Visit (HOSPITAL_COMMUNITY)
Admission: RE | Admit: 2021-08-26 | Discharge: 2021-08-26 | Disposition: A | Discharge status: Home / Self Care | Payer: Medicare HMO | Source: Ambulatory Visit | Attending: Cardiology | Admitting: Cardiology

## 2021-08-26 DIAGNOSIS — R011 Cardiac murmur, unspecified: Secondary | ICD-10-CM | POA: Diagnosis not present

## 2021-08-26 DIAGNOSIS — Z01810 Encounter for preprocedural cardiovascular examination: Secondary | ICD-10-CM | POA: Diagnosis not present

## 2021-08-26 LAB — ECHOCARDIOGRAM COMPLETE
AR max vel: 1.32 cm2
AV Area VTI: 1.49 cm2
AV Area mean vel: 1.49 cm2
AV Mean grad: 8 mmHg
AV Peak grad: 16.5 mmHg
Ao pk vel: 2.03 m/s
Area-P 1/2: 3.79 cm2
S' Lateral: 2.1 cm

## 2021-08-26 NOTE — Progress Notes (Signed)
*  PRELIMINARY RESULTS* Echocardiogram 2D Echocardiogram has been performed.  Todd George 08/26/2021, 9:24 AM

## 2021-08-30 ENCOUNTER — Telehealth: Payer: Self-pay | Admitting: Cardiology

## 2021-08-30 NOTE — Telephone Encounter (Signed)
Patient is calling for his recent echo results. [phone]

## 2021-08-31 NOTE — Telephone Encounter (Signed)
Fwd to provider as results are on desktop awaiting his final disposition.

## 2021-09-01 NOTE — Telephone Encounter (Signed)
Patient called requesting results of Echo.

## 2021-09-01 NOTE — Patient Instructions (Signed)
DUE TO COVID-19 ONLY ONE VISITOR IS ALLOWED TO COME WITH YOU AND STAY IN THE WAITING ROOM ONLY DURING PRE OP AND PROCEDURE DAY OF SURGERY IF YOU ARE GOING HOME AFTER SURGERY. IF YOU ARE SPENDING THE NIGHT 2 PEOPLE MAY VISIT WITH YOU IN YOUR PRIVATE ROOM AFTER SURGERY UNTIL VISITING  HOURS ARE OVER AT 800 PM AND THE 2 VISITORS CANNOT SPEND THE NIGHT.   YOU NEED TO HAVE A COVID 19 TEST ON__9/9__ THIS TEST MUST BE DONE BEFORE SURGERY,  COVID TESTING SITE  IS LOCATED AT [place], [place]. REMAIN IN YOUR CAR THIS IS A DRIVE UP TEST. AFTER YOUR COVID TEST PLEASE WEAR A MASK OUT IN PUBLIC AND SOCIAL DISTANCE AND [place] YOUR HANDS FREQUENTLY, ALSO ASK ALL YOUR CLOSE CONTACT PERSONS TO WEAR A MASK AND SOCIAL DISTANCE AND [place] THEIR HANDS FREQUENTLY ALSO.               Todd George     Your procedure is scheduled on: 09/06/21   Report to [facility] Main  Entrance   Report to admitting at  6:20 AM     Call this number if you have problems the morning of surgery [phone]    No food after midnight.    You may have clear liquid until 6:30 AM.    At 6:00 AM drink pre surgery drink.   Nothing by mouth after 6:30 AM.   CLEAR LIQUID DIET   Foods Allowed                                                                     Foods Excluded  NO MILK OR CREAMER  Coffee and tea, regular and decaf                             liquids that you cannot  Plain Jell-O any favor except red or purple                                           see through such as: Fruit ices (not with fruit pulp)                                     milk, soups, orange juice  Iced Popsicles                                    All solid food Carbonated beverages, regular and diet                                    Cranberry, grape and apple juices Sports drinks like Gatorade Lightly seasoned clear broth or consume(fat free) Sugar     BRUSH YOUR TEETH MORNING OF SURGERY AND RINSE YOUR MOUTH OUT, NO  CHEWING GUM CANDY OR MINTS.     Take these medicines the  morning of surgery with A SIP OF WATER:  GABAPENTIN, METOPROLOL, TAMSULOSIN, DITROPAN  DO NOT TAKE ANY DIABETIC MEDICATIONS DAY OF YOUR SURGERY                               You may not have any metal on your body including              piercings  Do not wear jewelry,  lotions, powders or deodorant                          Men may shave face and neck.   Do not bring valuables to the hospital. [place].  Contacts, dentures or bridgework may not be worn into surgery.        Special Instructions: N/A              Please read over the following fact sheets you were given: _____________________________________________________________________             [facility] - Preparing for Surgery Before surgery, you can play an important role.  Because skin is not sterile, your skin needs to be as free of germs as possible.  You can reduce the number of germs on your skin by washing with CHG (chlorahexidine gluconate) soap before surgery.  CHG is an antiseptic cleaner which kills germs and bonds with the skin to continue killing germs even after washing. Please DO NOT use if you have an allergy to CHG or antibacterial soaps.  If your skin becomes reddened/irritated stop using the CHG and inform your nurse when you arrive at Short Stay.   You may shave your face/neck. Please follow these instructions carefully:  1.  Shower with CHG Soap the night before surgery and the  morning of Surgery.  2.  If you choose to wash your hair, wash your hair first as usual with your  normal  shampoo.  3.  After you shampoo, rinse your hair and body thoroughly to remove the  shampoo.                            4.  Use CHG as you would any other liquid soap.  You can apply chg directly  to the skin and wash                       Gently with a scrungie or clean washcloth.  5.  Apply the CHG Soap to your body  ONLY FROM THE NECK DOWN.   Do not use on face/ open                           Wound or open sores. Avoid contact with eyes, ears mouth and genitals (private parts).                       Wash face,  Genitals (private parts) with your normal soap.             6.  Wash thoroughly, paying special attention to the area where your surgery  will be performed.  7.  Thoroughly rinse your body with warm water from the neck down.  8.  DO NOT shower/wash with your normal soap after using and rinsing off  the CHG Soap.             9.  Pat yourself dry with a clean towel.            10.  Wear clean pajamas.            11.  Place clean sheets on your bed the night of your first shower and do not  sleep with pets. Day of Surgery : Do not apply any lotions/deodorants the morning of surgery.  Please wear clean clothes to the hospital/surgery center.  FAILURE TO FOLLOW THESE INSTRUCTIONS MAY RESULT IN THE CANCELLATION OF YOUR SURGERY PATIENT SIGNATURE_________________________________  NURSE SIGNATURE__________________________________  ________________________________________________________________________   Todd George  An incentive spirometer is a tool that can help keep your lungs clear and active. This tool measures how well you are filling your lungs with each breath. Taking long deep breaths may help reverse or decrease the chance of developing breathing (pulmonary) problems (especially infection) following: A long period of time when you are unable to move or be active. BEFORE THE PROCEDURE  If the spirometer includes an indicator to show your best effort, your nurse or respiratory therapist will set it to a desired goal. If possible, sit up straight or lean slightly forward. Try not to slouch. Hold the incentive spirometer in an upright position. INSTRUCTIONS FOR USE  Sit on the edge of your bed if possible, or sit up as far as you can in bed or on a chair. Hold the incentive spirometer  in an upright position. Breathe out normally. Place the mouthpiece in your mouth and seal your lips tightly around it. Breathe in slowly and as deeply as possible, raising the piston or the ball toward the top of the column. Hold your breath for 3-5 seconds or for as long as possible. Allow the piston or ball to fall to the bottom of the column. Remove the mouthpiece from your mouth and breathe out normally. Rest for a few seconds and repeat Steps 1 through 7 at least 10 times every 1-2 hours when you are awake. Take your time and take a few normal breaths between deep breaths. The spirometer may include an indicator to show your best effort. Use the indicator as a goal to work toward during each repetition. After each set of 10 deep breaths, practice coughing to be sure your lungs are clear. If you have an incision (the cut made at the time of surgery), support your incision when coughing by placing a pillow or rolled up towels firmly against it. Once you are able to get out of bed, walk around indoors and cough well. You may stop using the incentive spirometer when instructed by your caregiver.  RISKS AND COMPLICATIONS Take your time so you do not get dizzy or light-headed. If you are in pain, you may need to take or ask for pain medication before doing incentive spirometry. It is harder to take a deep breath if you are having pain. AFTER USE Rest and breathe slowly and easily. It can be helpful to keep track of a log of your progress. Your caregiver can provide you with a simple table to help with this. If you are using the spirometer at home, follow these instructions: [place] IF:  You are having difficultly using the spirometer. You have trouble using the spirometer as often as instructed. Your pain medication is not giving enough relief  while using the spirometer. You develop fever of 100.5 F (38.1 C) or higher. SEEK IMMEDIATE MEDICAL CARE IF:  You cough up bloody sputum that  had not been present before. You develop fever of 102 F (38.9 C) or greater. You develop worsening pain at or near the incision site. MAKE SURE YOU:  Understand these instructions. Will watch your condition. Will get help right away if you are not doing well or get worse. Document Released: 04/23/2007 Document Revised: 03/04/2012 Document Reviewed: 06/24/2007 [facility] Patient Information 2014 [place], [place].   ________________________________________________________________________

## 2021-09-01 NOTE — Telephone Encounter (Signed)
Laurine Blazer, LPN  [id number]  [id number] PM EDT Back to Top    Notified, copy to pcp & Dr. Paralee Cancel.    Arnoldo Lenis, MD  09/01/2021  3:10 PM EDT     Echo shows heart pumping function is normal. 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 There is nothing in the echo to prevent his upcoming surgery   Carlyle Dolly MD

## 2021-09-02 ENCOUNTER — Other Ambulatory Visit: Payer: Self-pay | Admitting: Orthopedic Surgery

## 2021-09-02 ENCOUNTER — Encounter (HOSPITAL_COMMUNITY)
Admission: RE | Admit: 2021-09-02 | Discharge: 2021-09-02 | Disposition: A | Discharge status: Home / Self Care | Payer: Medicare HMO | Source: Ambulatory Visit | Attending: Orthopedic Surgery | Admitting: Orthopedic Surgery

## 2021-09-02 ENCOUNTER — Other Ambulatory Visit: Payer: Self-pay

## 2021-09-02 ENCOUNTER — Encounter (HOSPITAL_COMMUNITY): Payer: Self-pay

## 2021-09-02 DIAGNOSIS — Z01812 Encounter for preprocedural laboratory examination: Secondary | ICD-10-CM | POA: Insufficient documentation

## 2021-09-02 DIAGNOSIS — Z79899 Other long term (current) drug therapy: Secondary | ICD-10-CM | POA: Diagnosis not present

## 2021-09-02 LAB — COMPREHENSIVE METABOLIC PANEL
ALT: 20 U/L (ref 0–44)
AST: 49 U/L — ABNORMAL HIGH (ref 15–41)
Albumin: 4.3 g/dL (ref 3.5–5.0)
Alkaline Phosphatase: 57 U/L (ref 38–126)
Anion gap: 8 (ref 5–15)
BUN: 29 mg/dL — ABNORMAL HIGH (ref 8–23)
CO2: 29 mmol/L (ref 22–32)
Calcium: 10 mg/dL (ref 8.9–10.3)
Chloride: 97 mmol/L — ABNORMAL LOW (ref 98–111)
Creatinine, Ser: 1.24 mg/dL (ref 0.61–1.24)
GFR, Estimated: 58 mL/min — ABNORMAL LOW (ref >60)
Glucose, Bld: 102 mg/dL — ABNORMAL HIGH (ref 70–99)
Potassium: 3.8 mmol/L (ref 3.5–5.1)
Sodium: 134 mmol/L — ABNORMAL LOW (ref 135–145)
Total Bilirubin: 1 mg/dL (ref 0.3–1.2)
Total Protein: 6.5 g/dL (ref 6.5–8.1)

## 2021-09-02 LAB — CBC
HCT: 43.9 % (ref 39.0–52.0)
Hemoglobin: 14.4 g/dL (ref 13.0–17.0)
MCH: 30.3 pg (ref 26.0–34.0)
MCHC: 32.8 g/dL (ref 30.0–36.0)
MCV: 92.4 fL (ref 80.0–100.0)
Platelets: 218 10*3/uL (ref 150–400)
RBC: 4.75 MIL/uL (ref 4.22–5.81)
RDW: 14 % (ref 11.5–15.5)
WBC: 8.8 10*3/uL (ref 4.0–10.5)
nRBC: 0 % (ref 0.0–0.2)

## 2021-09-02 LAB — SURGICAL PCR SCREEN
MRSA, PCR: NEGATIVE
Staphylococcus aureus: NEGATIVE

## 2021-09-02 LAB — GLUCOSE, CAPILLARY: Glucose-Capillary: 111 mg/dL — ABNORMAL HIGH (ref 70–99)

## 2021-09-02 NOTE — Progress Notes (Signed)
COVID test Completed: 09/02/21   PCP - dr. Lenetta Quaker LOV 02/28/21 Cardiologist - Dr. Zandra Abts LOV 08/24/21  Chest x-ray - no EKG - 08/24/21-epic Stress Test - no ECHO - 08/26/21-epic Cardiac Cath - no Pacemaker/ICD device last checked:NA  Sleep Study - yes CPAP - no has not used a mask in 10 years  Fasting Blood Sugar - 92-120 Checks Blood Sugar _____ times a day. Once a week  Blood Thinner Instructions:NA Aspirin Instructions: Last Dose:  Anesthesia review: yes  Patient denies shortness of breath, fever, cough and chest pain at PAT appointment Pt walks everyday. He reports no SOB with any activities. He has limited ROM in his neck.  Patient verbalized understanding of instructions that were given to them at the PAT appointment. Patient was also instructed that they will need to review over the PAT instructions again at home before surgery. Yes

## 2021-09-03 LAB — SARS CORONAVIRUS 2 (TAT 6-24 HRS): SARS Coronavirus 2: NEGATIVE

## 2021-09-05 NOTE — H&P (Signed)
TOTAL KNEE ADMISSION H&P  Patient is being admitted for right total knee arthroplasty.  Subjective:  Chief Complaint:right knee pain.  HPI: Todd George, 80 y.o. male, has a history of pain and functional disability in the right knee due to arthritis and has failed non-surgical conservative treatments for greater than 12 weeks to includeNSAID's and/or analgesics, corticosteriod injections, and activity modification.  Onset of symptoms was gradual, starting 3 years ago with gradually worsening course since that time. The patient noted no past surgery on the right knee(s).  Patient currently rates pain in the right knee(s) at 8 out of 10 with activity. Patient has worsening of pain with activity and weight bearing and pain that interferes with activities of daily living.  Patient has evidence of joint space narrowing by imaging studies. There is no active infection.  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   Gastric erosion with bleeding 01/02/2016   Duodenal erosion 01/02/2016   Tobacco abuse 01/01/2016   Essential hypertension 01/01/2016   Mucosal abnormality of stomach    GI bleed 12/31/2015   Diabetes mellitus without complication (HCC)    Hyperlipidemia    BPH (benign prostatic hyperplasia)    Carotid artery disease (HCC)    OSA on CPAP    COPD (chronic obstructive pulmonary disease) ([place])    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 Multiple cecal/ascending colon AVMs ablated. 1.5 cm carpet polyp in the ascending segment status post piecemeal snare polypectomy and tattooing. Innocent appearing colonic diverticulosis.   COLONOSCOPY N/A 06/14/2016   Procedure: COLONOSCOPY;  Surgeon: Daneil Dolin, MD;  [facility];  Service: Endoscopy;  Laterality: N/A;  1200   COLONOSCOPY WITH PROPOFOL N/A 11/28/2018   Procedure: COLONOSCOPY WITH PROPOFOL;  Surgeon: Daneil Dolin, MD;  [facility];  Service: Endoscopy;  Laterality: N/A;  8:30am   ESOPHAGOGASTRODUODENOSCOPY N/A 01/01/2016   RMR: Multiple gastric and duodenal erosions likely NSAID related.   GIVENS CAPSULE STUDY N/A 01/03/2016   Incomplete study, capsule never reach cecum. Scattered erosions and AVMs throughout the small bowel.   JOINT REPLACEMENT     POLYPECTOMY  11/28/2018   Procedure: POLYPECTOMY;  Surgeon: Daneil Dolin, MD;  [facility];  Service: Endoscopy;;  ascending colon   REPLACEMENT TOTAL KNEE Left    SHOULDER ARTHROSCOPY WITH DISTAL CLAVICLE RESECTION Right 05/28/2015   Procedure: SHOULDER  ARTHROSCOPY WITH DISTAL CLAVICLE RESECTION;  Surgeon: Melrose Nakayama, MD;  Location: [facility];  Service: Orthopedics;  Laterality: Right;   SHOULDER ARTHROSCOPY WITH SUBACROMIAL DECOMPRESSION Right 05/28/2015   Procedure: SHOULDER ARTHROSCOPY WITH SUBACROMIAL DECOMPRESSION;  Surgeon: Melrose Nakayama, MD;  Location: [facility];  Service: Orthopedics;  Laterality: Right;   THROAT SURGERY  1965    No  current facility-administered medications for this encounter.   Current Outpatient Medications  Medication Sig Dispense Refill Last Dose   acetaminophen (TYLENOL) 500 MG tablet Take 500 mg by mouth in the morning and at bedtime.      calcium-vitamin D (OSCAL WITH D) 500-200 MG-UNIT tablet Take 1 tablet by mouth daily with breakfast.      clotrimazole-betamethasone (LOTRISONE) cream Apply 1 application topically daily as needed (for jock itch).       ferrous sulfate 325 (65 FE) MG tablet Take 325 mg by mouth every 7 (seven) days.      gabapentin (NEURONTIN) 100 MG capsule Take 100 mg by mouth 2 (two) times daily.      hydrOXYzine (ATARAX/VISTARIL) 25 MG tablet Take 50 mg by mouth every 6 (six) hours as needed for itching.      lisinopril-hydrochlorothiazide (PRINZIDE,ZESTORETIC) 20-12.5 MG tablet Take 1 tablet by mouth daily.       losartan (COZAAR) 50 MG tablet Take 50 mg by mouth daily.       metoprolol succinate (TOPROL-XL) 25 MG 24 hr tablet Take 25 mg by mouth daily.      Multiple Vitamins-Minerals (PRESERVISION AREDS 2) CAPS Take 1 capsule by mouth 2 (two) times daily.       oxybutynin (DITROPAN) 5 MG tablet Take 5 mg by mouth daily.      PSYLLIUM PO Take 2 Scoops by mouth daily.      rOPINIRole (REQUIP) 0.5 MG tablet Take 0.5 mg by mouth 2 (two) times daily as needed (restless leg).      simvastatin (ZOCOR) 20 MG tablet Take 20 mg by mouth at bedtime.      tamsulosin (FLOMAX) 0.4 MG CAPS capsule Take 0.4 mg by mouth daily.       traMADol (ULTRAM) 50 MG tablet Take 50 mg by mouth every 6 (six) hours as needed for moderate pain.      Allergies  Allergen Reactions   Nsaids Other (See Comments)    Recurrent GI bleed    Social History   Tobacco Use   Smoking status: Every Day    Packs/day: 2.00    Years: 64.00    Pack years: 128.00    Types: Cigarettes   Smokeless tobacco: Never  Substance Use Topics   Alcohol use: Yes    Alcohol/week: 0.0 standard drinks    Comment: rare     Family History  Problem Relation Age of Onset   Cancer Mother      Review of Systems  Constitutional:  Negative for chills and fever.  Respiratory:  Negative for cough and shortness of breath.   Cardiovascular:  Negative for chest pain.  Gastrointestinal:  Negative for nausea and vomiting.  Musculoskeletal:  Positive for arthralgias.   Objective:  Physical Exam Well nourished and well developed. General: Alert and oriented x3, cooperative and pleasant, no acute distress. Head: normocephalic, atraumatic, neck supple. Eyes: EOMI.  Musculoskeletal: Right knee exam: No palpable effusion, warmth erythema Slight flexion contracture associated with a varus right knee. Flexion to 120 degrees with tightness Stable medial lateral collateral ligaments Neurovascular intact distally  Calves soft and nontender. Motor function intact in LE. Strength 5/5 LE bilaterally. Neuro: Distal pulses 2+. Sensation to light touch intact in LE.  Vital signs in last 24 hours:    Labs:   Estimated body mass index is 31.01 kg/m as calculated from the following:   Height as of 09/02/21: '5\' 7"'$  (1.702 m).   Weight as of 09/02/21: 89.8 kg.   Imaging Review Plain radiographs demonstrate severe degenerative joint disease of the right knee(s). The overall alignment isneutral. The bone quality appears to be adequate for age and reported activity level.      Assessment/Plan:  End stage arthritis, right knee   The patient history, physical examination, clinical judgment of the provider and imaging studies are consistent with end stage degenerative joint disease of the right knee(s) and total knee arthroplasty is deemed medically necessary. The treatment options including medical management, injection therapy arthroscopy and arthroplasty were discussed at length. The risks and benefits of total knee arthroplasty were presented and reviewed. The risks due to aseptic loosening, infection, stiffness, patella  tracking problems, thromboembolic complications and other imponderables were discussed. The patient acknowledged the explanation, agreed to proceed with the plan and consent was signed. Patient is being admitted for inpatient treatment for surgery, pain control, PT, OT, prophylactic antibiotics, VTE prophylaxis, progressive ambulation and ADL's and discharge planning. The patient is planning to be discharged  home.   Therapy Plans: outpatient therapy at [facility] Disposition: Home with his neighbors Planned DVT Prophylaxis: Xarelto '10mg'$  daily (hx of GI bleed) DME needed: walker, 3-n-1 PCP: Dr. Delphina Cahill Cardiologist: Dr. Harl Bowie TXA: IV Allergies: NSAIDs - GI bleed Anesthesia Concerns: none BMI: 30.9 Last HgbA1c: States he used to be diabetic, not on medication now. Unknown  Other: - Norco 5, robaxin - hx of GI bleed, no NSAIDS  Patient's anticipated LOS is less than 2 midnights, meeting these requirements: - Younger than 107 - Lives within 1 hour of care - Has a competent adult at home to recover with post-op recover - NO history of  - Chronic pain requiring opiods  - Diabetes  - Coronary Artery Disease  - Heart failure  - Heart attack  - Stroke  - DVT/VTE  - Cardiac arrhythmia  - Respiratory Failure/COPD  - Renal failure  - Anemia  - Advanced Liver disease  Griffith Citron, PA-C Orthopedic Surgery [facility] [place] [phone]

## 2021-09-06 ENCOUNTER — Encounter (HOSPITAL_COMMUNITY)
Admission: RE | Disposition: A | Discharge status: Home / Self Care | Payer: Self-pay | Source: Home / Self Care | Attending: Orthopedic Surgery

## 2021-09-06 ENCOUNTER — Ambulatory Visit (HOSPITAL_COMMUNITY): Payer: Medicare HMO | Admitting: Registered Nurse

## 2021-09-06 ENCOUNTER — Encounter (HOSPITAL_COMMUNITY): Payer: Self-pay | Admitting: Orthopedic Surgery

## 2021-09-06 ENCOUNTER — Observation Stay (HOSPITAL_COMMUNITY)
Admission: RE | Admit: 2021-09-06 | Discharge: 2021-09-07 | Disposition: A | Discharge status: Home / Self Care | Payer: Medicare HMO | Attending: Orthopedic Surgery | Admitting: Orthopedic Surgery

## 2021-09-06 ENCOUNTER — Other Ambulatory Visit: Payer: Self-pay

## 2021-09-06 DIAGNOSIS — M1711 Unilateral primary osteoarthritis, right knee: Principal | ICD-10-CM | POA: Insufficient documentation

## 2021-09-06 DIAGNOSIS — Z85828 Personal history of other malignant neoplasm of skin: Secondary | ICD-10-CM | POA: Insufficient documentation

## 2021-09-06 DIAGNOSIS — J449 Chronic obstructive pulmonary disease, unspecified: Secondary | ICD-10-CM | POA: Diagnosis not present

## 2021-09-06 DIAGNOSIS — Z96651 Presence of right artificial knee joint: Secondary | ICD-10-CM

## 2021-09-06 DIAGNOSIS — Z79899 Other long term (current) drug therapy: Secondary | ICD-10-CM | POA: Insufficient documentation

## 2021-09-06 DIAGNOSIS — G8918 Other acute postprocedural pain: Secondary | ICD-10-CM | POA: Diagnosis not present

## 2021-09-06 DIAGNOSIS — Z96652 Presence of left artificial knee joint: Secondary | ICD-10-CM | POA: Insufficient documentation

## 2021-09-06 DIAGNOSIS — F17210 Nicotine dependence, cigarettes, uncomplicated: Secondary | ICD-10-CM | POA: Diagnosis not present

## 2021-09-06 DIAGNOSIS — E119 Type 2 diabetes mellitus without complications: Secondary | ICD-10-CM | POA: Diagnosis not present

## 2021-09-06 DIAGNOSIS — I10 Essential (primary) hypertension: Secondary | ICD-10-CM | POA: Insufficient documentation

## 2021-09-06 HISTORY — PX: TOTAL KNEE ARTHROPLASTY: SHX125

## 2021-09-06 LAB — TYPE AND SCREEN
ABO/RH(D): B POS
Antibody Screen: NEGATIVE

## 2021-09-06 LAB — GLUCOSE, CAPILLARY: Glucose-Capillary: 136 mg/dL — ABNORMAL HIGH (ref 70–99)

## 2021-09-06 SURGERY — ARTHROPLASTY, KNEE, TOTAL
Anesthesia: Spinal | Site: Knee | Laterality: Right

## 2021-09-06 MED ORDER — GLYCOPYRROLATE 0.2 MG/ML IJ SOLN
INTRAMUSCULAR | Status: DC | PRN
  Administered 2021-09-06: .2 mg via INTRAVENOUS

## 2021-09-06 MED ORDER — BISACODYL 10 MG RE SUPP: 10.0000 mg | Freq: Every day | RECTAL | Status: DC | PRN

## 2021-09-06 MED ORDER — FERROUS SULFATE 325 (65 FE) MG PO TABS
325.0000 mg | ORAL_TABLET | Freq: Three times a day (TID) | ORAL | Status: DC
  Administered 2021-09-07: 325 mg via ORAL
  Filled 2021-09-06 (×2): qty 1

## 2021-09-06 MED ORDER — LACTATED RINGERS IV SOLN: INTRAVENOUS | Status: DC

## 2021-09-06 MED ORDER — BUPIVACAINE-EPINEPHRINE (PF) 0.25% -1:200000 IJ SOLN
INTRAMUSCULAR | Status: AC
  Filled 2021-09-06: qty 30

## 2021-09-06 MED ORDER — PROPOFOL 10 MG/ML IV BOLUS
INTRAVENOUS | Status: AC
  Filled 2021-09-06: qty 20

## 2021-09-06 MED ORDER — FENTANYL CITRATE (PF) 250 MCG/5ML IJ SOLN
INTRAMUSCULAR | Status: AC
  Filled 2021-09-06: qty 5

## 2021-09-06 MED ORDER — LOSARTAN POTASSIUM 50 MG PO TABS: 50.0000 mg | ORAL_TABLET | Freq: Every day | ORAL | Status: DC

## 2021-09-06 MED ORDER — FENTANYL CITRATE PF 50 MCG/ML IJ SOSY
50.0000 ug | PREFILLED_SYRINGE | INTRAMUSCULAR | Status: AC
  Administered 2021-09-06: 75 ug via INTRAVENOUS
  Filled 2021-09-06: qty 2

## 2021-09-06 MED ORDER — DOCUSATE SODIUM 100 MG PO CAPS
100.0000 mg | ORAL_CAPSULE | Freq: Two times a day (BID) | ORAL | Status: DC
  Administered 2021-09-06 – 2021-09-07 (×2): 100 mg via ORAL
  Filled 2021-09-06 (×2): qty 1

## 2021-09-06 MED ORDER — OXYBUTYNIN CHLORIDE 5 MG PO TABS
5.0000 mg | ORAL_TABLET | Freq: Every day | ORAL | Status: DC
  Administered 2021-09-07: 5 mg via ORAL
  Filled 2021-09-06: qty 1

## 2021-09-06 MED ORDER — HYDROMORPHONE HCL 1 MG/ML IJ SOLN
0.2500 mg | INTRAMUSCULAR | Status: DC | PRN
  Administered 2021-09-06 (×3): 0.25 mg via INTRAVENOUS

## 2021-09-06 MED ORDER — GLYCOPYRROLATE 0.2 MG/ML IJ SOLN
INTRAMUSCULAR | Status: AC
  Filled 2021-09-06: qty 1

## 2021-09-06 MED ORDER — ONDANSETRON HCL 4 MG/2ML IJ SOLN
INTRAMUSCULAR | Status: AC
  Filled 2021-09-06: qty 2

## 2021-09-06 MED ORDER — HYDROCODONE-ACETAMINOPHEN 7.5-325 MG PO TABS: 1.0000 | ORAL_TABLET | ORAL | Status: DC | PRN

## 2021-09-06 MED ORDER — ONDANSETRON HCL 4 MG/2ML IJ SOLN: 4.0000 mg | Freq: Four times a day (QID) | INTRAMUSCULAR | Status: DC | PRN

## 2021-09-06 MED ORDER — FENTANYL CITRATE (PF) 250 MCG/5ML IJ SOLN
INTRAMUSCULAR | Status: DC | PRN
  Administered 2021-09-06 (×2): 100 ug via INTRAVENOUS
  Administered 2021-09-06: 50 ug via INTRAVENOUS

## 2021-09-06 MED ORDER — BUPIVACAINE-EPINEPHRINE (PF) 0.25% -1:200000 IJ SOLN
INTRAMUSCULAR | Status: DC | PRN
  Administered 2021-09-06: 30 mL

## 2021-09-06 MED ORDER — ROPINIROLE HCL 0.5 MG PO TABS: 0.5000 mg | ORAL_TABLET | Freq: Two times a day (BID) | ORAL | Status: DC | PRN

## 2021-09-06 MED ORDER — HYDROCODONE-ACETAMINOPHEN 5-325 MG PO TABS
1.0000 | ORAL_TABLET | ORAL | Status: DC | PRN
  Administered 2021-09-06 – 2021-09-07 (×2): 1 via ORAL
  Filled 2021-09-06 (×3): qty 1

## 2021-09-06 MED ORDER — METOCLOPRAMIDE HCL 5 MG/ML IJ SOLN: 5.0000 mg | Freq: Three times a day (TID) | INTRAMUSCULAR | Status: DC | PRN

## 2021-09-06 MED ORDER — RIVAROXABAN 10 MG PO TABS
10.0000 mg | ORAL_TABLET | Freq: Every day | ORAL | Status: DC
  Administered 2021-09-07: 10 mg via ORAL
  Filled 2021-09-06: qty 1

## 2021-09-06 MED ORDER — ACETAMINOPHEN 325 MG PO TABS: 325.0000 mg | ORAL_TABLET | Freq: Four times a day (QID) | ORAL | Status: DC | PRN

## 2021-09-06 MED ORDER — ONDANSETRON HCL 4 MG/2ML IJ SOLN
INTRAMUSCULAR | Status: DC | PRN
  Administered 2021-09-06: 4 mg via INTRAVENOUS

## 2021-09-06 MED ORDER — MENTHOL 3 MG MT LOZG
1.0000 | LOZENGE | OROMUCOSAL | Status: DC | PRN
  Administered 2021-09-07: 3 mg via ORAL
  Filled 2021-09-06: qty 9

## 2021-09-06 MED ORDER — METOCLOPRAMIDE HCL 5 MG PO TABS: 5.0000 mg | ORAL_TABLET | Freq: Three times a day (TID) | ORAL | Status: DC | PRN

## 2021-09-06 MED ORDER — ROCURONIUM BROMIDE 10 MG/ML (PF) SYRINGE
PREFILLED_SYRINGE | INTRAVENOUS | Status: DC | PRN
  Administered 2021-09-06: 60 mg via INTRAVENOUS

## 2021-09-06 MED ORDER — SUGAMMADEX SODIUM 200 MG/2ML IV SOLN
INTRAVENOUS | Status: DC | PRN
  Administered 2021-09-06: 200 mg via INTRAVENOUS

## 2021-09-06 MED ORDER — TRANEXAMIC ACID-NACL 1000-0.7 MG/100ML-% IV SOLN
1000.0000 mg | Freq: Once | INTRAVENOUS | Status: AC
  Administered 2021-09-06: 1000 mg via INTRAVENOUS
  Filled 2021-09-06: qty 100

## 2021-09-06 MED ORDER — PHENOL 1.4 % MT LIQD: 1.0000 | OROMUCOSAL | Status: DC | PRN

## 2021-09-06 MED ORDER — LIDOCAINE 2% (20 MG/ML) 5 ML SYRINGE
INTRAMUSCULAR | Status: DC | PRN
  Administered 2021-09-06: 80 mg via INTRAVENOUS

## 2021-09-06 MED ORDER — PROPOFOL 10 MG/ML IV BOLUS
INTRAVENOUS | Status: DC | PRN
  Administered 2021-09-06: 130 mg via INTRAVENOUS

## 2021-09-06 MED ORDER — PHENYLEPHRINE HCL-NACL 20-0.9 MG/250ML-% IV SOLN
INTRAVENOUS | Status: DC | PRN
  Administered 2021-09-06: 50 ug/min via INTRAVENOUS

## 2021-09-06 MED ORDER — SIMVASTATIN 20 MG PO TABS
20.0000 mg | ORAL_TABLET | Freq: Every day | ORAL | Status: DC
  Administered 2021-09-06: 20 mg via ORAL
  Filled 2021-09-06: qty 1

## 2021-09-06 MED ORDER — METOPROLOL SUCCINATE ER 25 MG PO TB24: 25.0000 mg | ORAL_TABLET | Freq: Every day | ORAL | Status: DC

## 2021-09-06 MED ORDER — 0.9 % SODIUM CHLORIDE (POUR BTL) OPTIME
TOPICAL | Status: DC | PRN
  Administered 2021-09-06: 1000 mL

## 2021-09-06 MED ORDER — POVIDONE-IODINE 10 % EX SWAB
2.0000 "application " | Freq: Once | CUTANEOUS | Status: AC
  Administered 2021-09-06: 2 via TOPICAL

## 2021-09-06 MED ORDER — METHOCARBAMOL 500 MG PO TABS
500.0000 mg | ORAL_TABLET | Freq: Four times a day (QID) | ORAL | Status: DC | PRN
  Administered 2021-09-06: 500 mg via ORAL
  Filled 2021-09-06: qty 1

## 2021-09-06 MED ORDER — DEXAMETHASONE SODIUM PHOSPHATE 10 MG/ML IJ SOLN
8.0000 mg | Freq: Once | INTRAMUSCULAR | Status: AC
  Administered 2021-09-06: 8 mg via INTRAVENOUS

## 2021-09-06 MED ORDER — POLYETHYLENE GLYCOL 3350 17 G PO PACK: 17.0000 g | PACK | Freq: Every day | ORAL | Status: DC | PRN

## 2021-09-06 MED ORDER — DEXAMETHASONE SODIUM PHOSPHATE 10 MG/ML IJ SOLN
INTRAMUSCULAR | Status: AC
  Filled 2021-09-06: qty 1

## 2021-09-06 MED ORDER — HYDROXYZINE HCL 25 MG PO TABS: 50.0000 mg | ORAL_TABLET | Freq: Four times a day (QID) | ORAL | Status: DC | PRN

## 2021-09-06 MED ORDER — KETOROLAC TROMETHAMINE 30 MG/ML IJ SOLN
INTRAMUSCULAR | Status: AC
  Filled 2021-09-06: qty 1

## 2021-09-06 MED ORDER — LIDOCAINE 2% (20 MG/ML) 5 ML SYRINGE
INTRAMUSCULAR | Status: AC
  Filled 2021-09-06: qty 5

## 2021-09-06 MED ORDER — HYDROCHLOROTHIAZIDE 12.5 MG PO CAPS
12.5000 mg | ORAL_CAPSULE | Freq: Every day | ORAL | Status: DC
  Administered 2021-09-07: 12.5 mg via ORAL
  Filled 2021-09-06: qty 1

## 2021-09-06 MED ORDER — CEFAZOLIN SODIUM-DEXTROSE 2-4 GM/100ML-% IV SOLN
2.0000 g | INTRAVENOUS | Status: AC
  Administered 2021-09-06: 2 g via INTRAVENOUS
  Filled 2021-09-06: qty 100

## 2021-09-06 MED ORDER — HYDROMORPHONE HCL 1 MG/ML IJ SOLN
INTRAMUSCULAR | Status: AC
  Administered 2021-09-06: 0.25 mg via INTRAVENOUS
  Filled 2021-09-06: qty 1

## 2021-09-06 MED ORDER — SODIUM CHLORIDE (PF) 0.9 % IJ SOLN
INTRAMUSCULAR | Status: DC | PRN
  Administered 2021-09-06: 30 mL

## 2021-09-06 MED ORDER — EPHEDRINE SULFATE-NACL 50-0.9 MG/10ML-% IV SOSY
PREFILLED_SYRINGE | INTRAVENOUS | Status: DC | PRN
  Administered 2021-09-06: 5 mg via INTRAVENOUS
  Administered 2021-09-06 (×2): 10 mg via INTRAVENOUS

## 2021-09-06 MED ORDER — MORPHINE SULFATE (PF) 2 MG/ML IV SOLN: 0.5000 mg | INTRAVENOUS | Status: DC | PRN

## 2021-09-06 MED ORDER — CEFAZOLIN SODIUM-DEXTROSE 2-4 GM/100ML-% IV SOLN
2.0000 g | Freq: Four times a day (QID) | INTRAVENOUS | Status: AC
  Administered 2021-09-06 (×2): 2 g via INTRAVENOUS
  Filled 2021-09-06 (×2): qty 100

## 2021-09-06 MED ORDER — FENTANYL CITRATE (PF) 100 MCG/2ML IJ SOLN: INTRAMUSCULAR | Status: DC | PRN

## 2021-09-06 MED ORDER — LISINOPRIL-HYDROCHLOROTHIAZIDE 20-12.5 MG PO TABS: 1.0000 | ORAL_TABLET | Freq: Every day | ORAL | Status: DC

## 2021-09-06 MED ORDER — KETOROLAC TROMETHAMINE 30 MG/ML IJ SOLN
INTRAMUSCULAR | Status: DC | PRN
  Administered 2021-09-06: 30 mg via INTRA_ARTICULAR

## 2021-09-06 MED ORDER — METHOCARBAMOL 500 MG IVPB - SIMPLE MED
INTRAVENOUS | Status: AC
  Administered 2021-09-06: 500 mg via INTRAVENOUS
  Filled 2021-09-06: qty 50

## 2021-09-06 MED ORDER — METHOCARBAMOL 500 MG IVPB - SIMPLE MED
500.0000 mg | Freq: Four times a day (QID) | INTRAVENOUS | Status: DC | PRN
  Filled 2021-09-06: qty 50

## 2021-09-06 MED ORDER — LISINOPRIL 20 MG PO TABS: 20.0000 mg | ORAL_TABLET | Freq: Every day | ORAL | Status: DC

## 2021-09-06 MED ORDER — SODIUM CHLORIDE 0.9 % IV SOLN: INTRAVENOUS | Status: DC

## 2021-09-06 MED ORDER — DIPHENHYDRAMINE HCL 12.5 MG/5ML PO ELIX: 12.5000 mg | ORAL_SOLUTION | ORAL | Status: DC | PRN

## 2021-09-06 MED ORDER — DEXAMETHASONE SODIUM PHOSPHATE 10 MG/ML IJ SOLN
10.0000 mg | Freq: Once | INTRAMUSCULAR | Status: AC
  Administered 2021-09-07: 10 mg via INTRAVENOUS
  Filled 2021-09-06: qty 1

## 2021-09-06 MED ORDER — STERILE WATER FOR IRRIGATION IR SOLN
Status: DC | PRN
  Administered 2021-09-06: 2000 mL

## 2021-09-06 MED ORDER — ROCURONIUM BROMIDE 10 MG/ML (PF) SYRINGE
PREFILLED_SYRINGE | INTRAVENOUS | Status: AC
  Filled 2021-09-06: qty 10

## 2021-09-06 MED ORDER — ONDANSETRON HCL 4 MG PO TABS: 4.0000 mg | ORAL_TABLET | Freq: Four times a day (QID) | ORAL | Status: DC | PRN

## 2021-09-06 MED ORDER — TAMSULOSIN HCL 0.4 MG PO CAPS
0.4000 mg | ORAL_CAPSULE | Freq: Every day | ORAL | Status: DC
  Administered 2021-09-07: 0.4 mg via ORAL
  Filled 2021-09-06: qty 1

## 2021-09-06 MED ORDER — TRANEXAMIC ACID-NACL 1000-0.7 MG/100ML-% IV SOLN
1000.0000 mg | INTRAVENOUS | Status: AC
  Administered 2021-09-06: 1000 mg via INTRAVENOUS
  Filled 2021-09-06: qty 100

## 2021-09-06 MED ORDER — GABAPENTIN 100 MG PO CAPS
100.0000 mg | ORAL_CAPSULE | Freq: Two times a day (BID) | ORAL | Status: DC
  Administered 2021-09-06 – 2021-09-07 (×2): 100 mg via ORAL
  Filled 2021-09-06 (×2): qty 1

## 2021-09-06 SURGICAL SUPPLY — 54 items
ATTUNE MED ANAT PAT 38 KNEE (Knees) ×2 IMPLANT
ATTUNE PS FEM RT SZ 6 CEM KNEE (Femur) ×2 IMPLANT
ATTUNE PSRP INSR SZ6 6 KNEE (Insert) ×2 IMPLANT
BAG COUNTER SPONGE SURGICOUNT (BAG) IMPLANT
BAG ZIPLOCK 12X15 (MISCELLANEOUS) IMPLANT
BASE TIBIAL ROT PLAT SZ 7 KNEE (Knees) ×1 IMPLANT
BLADE SAW SGTL 11.0X1.19X90.0M (BLADE) IMPLANT
BLADE SAW SGTL 13.0X1.19X90.0M (BLADE) ×2 IMPLANT
BLADE SURG SZ10 CARB STEEL (BLADE) ×4 IMPLANT
BNDG ELASTIC 6X10 VLCR STRL LF (GAUZE/BANDAGES/DRESSINGS) ×2 IMPLANT
BNDG ELASTIC 6X5.8 VLCR STR LF (GAUZE/BANDAGES/DRESSINGS) ×2 IMPLANT
BOWL SMART MIX CTS (DISPOSABLE) ×2 IMPLANT
CEMENT HV SMART SET (Cement) ×4 IMPLANT
CUFF TOURN SGL QUICK 34 (TOURNIQUET CUFF) ×1
CUFF TRNQT CYL 34X4.125X (TOURNIQUET CUFF) ×1 IMPLANT
DECANTER SPIKE VIAL GLASS SM (MISCELLANEOUS) ×4 IMPLANT
DERMABOND ADVANCED (GAUZE/BANDAGES/DRESSINGS) ×1
DERMABOND ADVANCED .7 DNX12 (GAUZE/BANDAGES/DRESSINGS) ×1 IMPLANT
DRAPE INCISE IOBAN 66X45 STRL (DRAPES) ×6 IMPLANT
DRAPE U-SHAPE 47X51 STRL (DRAPES) ×2 IMPLANT
DRESSING AQUACEL AG SP 3.5X10 (GAUZE/BANDAGES/DRESSINGS) ×1 IMPLANT
DRSG AQUACEL AG ADV 3.5X10 (GAUZE/BANDAGES/DRESSINGS) ×2 IMPLANT
DRSG AQUACEL AG SP 3.5X10 (GAUZE/BANDAGES/DRESSINGS) ×2
DURAPREP 26ML APPLICATOR (WOUND CARE) ×4 IMPLANT
ELECT REM PT RETURN 15FT ADLT (MISCELLANEOUS) ×2 IMPLANT
GLOVE SURG ENC MOIS LTX SZ6 (GLOVE) IMPLANT
GLOVE SURG UNDER LTX SZ7.5 (GLOVE) ×2 IMPLANT
GLOVE SURG UNDER POLY LF SZ6.5 (GLOVE) IMPLANT
GLOVE SURG UNDER POLY LF SZ7.5 (GLOVE) ×2 IMPLANT
GOWN STRL REUS W/TWL LRG LVL3 (GOWN DISPOSABLE) ×2 IMPLANT
HANDPIECE INTERPULSE COAX TIP (DISPOSABLE) ×1
HOLDER FOLEY CATH W/STRAP (MISCELLANEOUS) IMPLANT
KIT TURNOVER KIT A (KITS) ×2 IMPLANT
MANIFOLD NEPTUNE II (INSTRUMENTS) ×2 IMPLANT
NDL SAFETY ECLIPSE 18X1.5 (NEEDLE) IMPLANT
NEEDLE HYPO 18GX1.5 SHARP (NEEDLE)
NS IRRIG 1000ML POUR BTL (IV SOLUTION) ×2 IMPLANT
PACK TOTAL KNEE CUSTOM (KITS) ×2 IMPLANT
PIN DRILL FIX HALF THREAD (BIT) ×2 IMPLANT
PIN FIX SIGMA LCS THRD HI (PIN) ×2 IMPLANT
PROTECTOR NERVE ULNAR (MISCELLANEOUS) ×2 IMPLANT
SET HNDPC FAN SPRY TIP SCT (DISPOSABLE) ×1 IMPLANT
SET PAD KNEE POSITIONER (MISCELLANEOUS) ×2 IMPLANT
SUT MNCRL AB 4-0 PS2 18 (SUTURE) ×2 IMPLANT
SUT STRATAFIX PDS+ 0 24IN (SUTURE) ×2 IMPLANT
SUT VIC AB 1 CT1 36 (SUTURE) ×2 IMPLANT
SUT VIC AB 2-0 CT1 27 (SUTURE) ×3
SUT VIC AB 2-0 CT1 TAPERPNT 27 (SUTURE) ×3 IMPLANT
SYR 3ML LL SCALE MARK (SYRINGE) ×2 IMPLANT
TIBIAL BASE ROT PLAT SZ 7 KNEE (Knees) ×2 IMPLANT
TRAY FOLEY MTR SLVR 16FR STAT (SET/KITS/TRAYS/PACK) ×2 IMPLANT
TUBE SUCTION HIGH CAP CLEAR NV (SUCTIONS) ×2 IMPLANT
WATER STERILE IRR 1000ML POUR (IV SOLUTION) ×4 IMPLANT
WRAP KNEE MAXI GEL POST OP (GAUZE/BANDAGES/DRESSINGS) ×2 IMPLANT

## 2021-09-06 NOTE — Progress Notes (Signed)
RT NOTE:  Pt states he does not wear a CPAP machine at night. 

## 2021-09-06 NOTE — Transfer of Care (Signed)
Immediate Anesthesia Transfer of Care Note  Patient: Todd George  Procedure(s) Performed: TOTAL KNEE ARTHROPLASTY (Right: Knee)  Patient Location: PACU  Anesthesia Type:General  Level of Consciousness: sedated  Airway & Oxygen Therapy: Patient Spontanous Breathing and Patient connected to face mask oxygen  Post-op Assessment: Report given to RN and Post -op Vital signs reviewed and stable  Post vital signs: Reviewed and stable  Last Vitals:  Vitals Value Taken Time  BP    Temp    Pulse 69 09/06/21 1210  Resp 16 09/06/21 1210  SpO2 97 % 09/06/21 1210  Vitals shown include unvalidated device data.  Last Pain:  Vitals:   09/06/21 0945  PainSc: 0-No pain      Patients Stated Pain Goal: 5 ([id number])  Complications: No notable events documented.

## 2021-09-06 NOTE — Care Plan (Signed)
Ortho Bundle Case Management Note  Patient Details  Name: ISACK BAAB MRN: [id number] Date of Birth: 04-24-1941  R TKA on 09-06-21 DCP:  Home with neighbors.  1 story home with 0 ste. DME:  RW and 3-in-1 ordered through [facility] PT:  Forestine Na.  PT eval scheduled on 09-09-21.                   DME Arranged:  Walker rolling, 3-N-1 DME Agency:  [employer]  HH Arranged:  NA HH Agency:  NA  Additional Comments: Please contact me with any questions of if this plan should need to change.  Marianne Sofia, RN,CCM [facility]  [phone] 09/06/2021, 8:47 AM

## 2021-09-06 NOTE — Op Note (Signed)
NAME:  Todd George                      MEDICAL RECORD NO.:  [id number]                             FACILITY:  [facility]      PHYSICIAN:  Pietro Cassis. Alvan Dame, M.D.  DATE OF BIRTH:  [birth year]      DATE OF PROCEDURE:  09/06/2021                                     OPERATIVE REPORT         PREOPERATIVE DIAGNOSIS:  Right knee osteoarthritis.      POSTOPERATIVE DIAGNOSIS:  Right knee osteoarthritis.      FINDINGS:  The patient was noted to have complete loss of cartilage and   bone-on-bone arthritis with associated osteophytes in the medial and patellofemoral compartments of   the knee with a significant synovitis and associated effusion.  The patient had failed months of conservative treatment including medications, injection therapy, activity modification.     PROCEDURE:  Right total knee replacement.      COMPONENTS USED:  DePuy Attune rotating platform posterior stabilized knee   system, a size 6 femur, 7 tibia, size 6 mm PS AOX insert, and 38 anatomic patellar   button.      SURGEON:  Pietro Cassis. Alvan Dame, M.D.      ASSISTANT:  Costella Hatcher, PA-C.      ANESTHESIA:  General and Regional.      SPECIMENS:  None.      COMPLICATION:  None.      DRAINS:  None.  EBL: <100 cc      TOURNIQUET TIME:   Total Tourniquet Time Documented: Thigh (Right) - 29 minutes Total: Thigh (Right) - 29 minutes  .      The patient was stable to the recovery room.      INDICATION FOR PROCEDURE:  Todd George is a 80 y.o. male patient of   mine.  The patient had been seen, evaluated, and treated for months conservatively in the   office with medication, activity modification, and injections.  The patient had   radiographic changes of bone-on-bone arthritis with endplate sclerosis and osteophytes noted.  Based on the radiographic changes and failed conservative measures, the patient   decided to proceed with definitive treatment, total knee replacement.  Risks of infection, DVT, component failure,  need for revision surgery, neurovascular injury were reviewed in the office setting.  The postop course was reviewed stressing the efforts to maximize post-operative satisfaction and function.  Consent was obtained for benefit of pain   relief.      PROCEDURE IN DETAIL:  The patient was brought to the operative theater.   Once adequate anesthesia, preoperative antibiotics, 2 gm of Ancef,1 gm of Tranexamic Acid, and 10 mg of Decadron administered, the patient was positioned supine with a right thigh tourniquet placed.  The  right lower extremity was prepped and draped in sterile fashion.  A time-   out was performed identifying the patient, planned procedure, and the appropriate extremity.      The right lower extremity was placed in the [facility] leg holder.  The leg was   exsanguinated, tourniquet elevated to 250 mmHg.  A midline incision was  made followed by median parapatellar arthrotomy.  Following initial   exposure, attention was first directed to the patella.  Precut   measurement was noted to be 24 mm.  I resected down to 14 mm and used a   38 anatomic patellar button to restore patellar height as well as cover the cut surface.      The lug holes were drilled and a metal shim was placed to protect the   patella from retractors and saw blade during the procedure.      At this point, attention was now directed to the femur.  The femoral   canal was opened with a drill, irrigated to try to prevent fat emboli.  An   intramedullary rod was passed at 5 degrees valgus, 9 mm of bone was   resected off the distal femur.  Following this resection, the tibia was   subluxated anteriorly.  Using the extramedullary guide, 2 mm of bone was resected off   the proximal medial tibia.  We confirmed the gap would be   stable medially and laterally with a size 5 spacer block as well as confirmed that the tibial cut was perpendicular in the coronal plane, checking with an alignment rod.      Once this was  done, I sized the femur to be a size 6 in the anterior-   posterior dimension, chose a standard component based on medial and   lateral dimension.  The size 6 rotation block was then pinned in   position anterior referenced using the C-clamp to set rotation.  The   anterior, posterior, and  chamfer cuts were made without difficulty nor   notching making certain that I was along the anterior cortex to help   with flexion gap stability.      The final box cut was made off the lateral aspect of distal femur.      At this point, the tibia was sized to be a size 7.  The size 7 tray was   then pinned in position through the medial third of the tubercle,   drilled, and keel punched.  Trial reduction was now carried with a 6 femur,  7 tibia, a size 6 mm PS insert, and the 38 anatomic patella botton.  The knee was brought to full extension with good flexion stability with the patella   tracking through the trochlea without application of pressure.  Given   all these findings the trial components removed.  Final components were   opened and cement was mixed.  The knee was irrigated with normal saline solution and pulse lavage.  The synovial lining was   then injected with 30 cc of 0.25% Marcaine with epinephrine, 1 cc of Toradol and 30 cc of NS for a total of 61 cc.     Final implants were then cemented onto cleaned and dried cut surfaces of bone with the knee brought to extension with a size 6 mm PS trial insert.      Once the cement had fully cured, excess cement was removed   throughout the knee.  I confirmed that I was satisfied with the range of   motion and stability, and the final size 6 mm PS AOX insert was chosen.  It was   placed into the knee.      The tourniquet had been let down at 29 minutes.  No significant   hemostasis was required.  The extensor mechanism was then reapproximated using #1 Vicryl  and #1 Stratafix sutures with the knee   in flexion.  The   remaining wound was closed  with 2-0 Vicryl and running 4-0 Monocryl.   The knee was cleaned, dried, dressed sterilely using Dermabond and   Aquacel dressing.  The patient was then   brought to recovery room in stable condition, tolerating the procedure   well.   Please note that Physician Assistant, Costella Hatcher, PA-C was present for the entirety of the case, and was utilized for pre-operative positioning, peri-operative retractor management, general facilitation of the procedure and for primary wound closure at the end of the case.              Pietro Cassis Alvan Dame, M.D.    09/06/2021 11:43 AM

## 2021-09-06 NOTE — Anesthesia Preprocedure Evaluation (Addendum)
Anesthesia Evaluation  Patient identified by MRN, date of birth, ID bandGeneral Assessment Comment:Cardiac history noted. Dr. Nyoka Cowden  Reviewed: Allergy & Precautions, NPO status , Patient's Chart, lab work & pertinent test results  Airway Mallampati: II  TM Distance: >3 FB     Dental   Pulmonary sleep apnea , COPD, Current Smoker and Patient abstained from smoking.,    breath sounds clear to auscultation       Cardiovascular hypertension, (-) dysrhythmias + Valvular Problems/Murmurs AS  Rhythm:Regular Rate:Normal     Neuro/Psych    GI/Hepatic Neg liver ROS, PUD,   Endo/Other  diabetes  Renal/GU negative Renal ROS     Musculoskeletal   Abdominal   Peds  Hematology   Anesthesia Other Findings   Reproductive/Obstetrics                            Anesthesia Physical Anesthesia Plan  ASA: 3  Anesthesia Plan: General   Post-op Pain Management:    Induction: Intravenous  PONV Risk Score and Plan: 3 and Ondansetron and Treatment may vary due to age or medical condition  Airway Management Planned: Oral ETT  Additional Equipment:   Intra-op Plan:   Post-operative Plan: Extubation in OR  Informed Consent: I have reviewed the patients History and Physical, chart, labs and discussed the procedure including the risks, benefits and alternatives for the proposed anesthesia with the patient or authorized representative who has indicated his/her understanding and acceptance.     Dental advisory given  Plan Discussed with: Anesthesiologist and CRNA  Anesthesia Plan Comments:        Anesthesia Quick Evaluation

## 2021-09-06 NOTE — Anesthesia Procedure Notes (Signed)
Procedure Name: Intubation Date/Time: 09/06/2021 10:33 AM Performed by: Talbot Grumbling, CRNA Pre-anesthesia Checklist: Patient identified, Emergency Drugs available, Suction available and Patient being monitored Patient Re-evaluated:Patient Re-evaluated prior to induction Oxygen Delivery Method: Circle system utilized Preoxygenation: Pre-oxygenation with 100% oxygen Induction Type: IV induction Ventilation: Mask ventilation without difficulty Laryngoscope Size: Mac and 3 Grade View: Grade I Tube type: Oral Tube size: 7.5 mm Number of attempts: 1 Airway Equipment and Method: Stylet Placement Confirmation: ETT inserted through vocal cords under direct vision, positive ETCO2 and breath sounds checked- equal and bilateral Secured at: 23 cm Tube secured with: Tape Dental Injury: Teeth and Oropharynx as per pre-operative assessment

## 2021-09-06 NOTE — Evaluation (Addendum)
Physical Therapy Evaluation Patient Details Name: Todd George MRN: [id number] DOB: 1941/12/21 Today's Date: 09/06/2021  History of Present Illness  Patient is 80 y.o. male s/p Rt TKA on 09/06/21 with PMH significant for OA, BPH, cancer, COPD, DM, HLD, HTN, OSA, CAD, back pain, Lt TKA.    Clinical Impression  Todd George is a 80 y.o. male POD 0 s/p Rt TKA. Patient reports independence with mobility at baseline. Patient is now limited by functional impairments (see PT problem list below) and requires min guard/supervision for transfers and gait with RW. Patient was able to ambulate ~140 feet with RW and min guard/supervision. Patient instructed in exercise to facilitate circulation. Patient will benefit from continued skilled PT interventions to address impairments and progress towards PLOF. Acute PT will follow to progress mobility and stair training in preparation for safe discharge home.        Recommendations for follow up therapy are one component of a multi-disciplinary discharge planning process, led by the attending physician.  Recommendations may be updated based on patient status, additional functional criteria and insurance authorization.  Follow Up Recommendations Follow surgeon's recommendation for DC plan and follow-up therapies    Equipment Recommendations  None recommended by PT    Recommendations for Other Services       Precautions / Restrictions Precautions Precautions: Fall Restrictions Weight Bearing Restrictions: No Other Position/Activity Restrictions: WBAT      Mobility  Bed Mobility Overal bed mobility: Needs Assistance Bed Mobility: Supine to Sit     Supine to sit: Supervision     General bed mobility comments: no assist needed, supervision for safety, pt using bed rail    Transfers Overall transfer level: Needs assistance Equipment used: Rolling walker (2 wheeled) Transfers: Sit to/from Stand Sit to Stand: Min guard;Supervision          General transfer comment: cues for hand placement with RW and power up from EOB and recliner. guard/supervision for safety.  Ambulation/Gait Ambulation/Gait assistance: Min guard;Supervision Gait Distance (Feet): 140 Feet Assistive device: Rolling walker (2 wheeled) Gait Pattern/deviations: Step-to pattern;Decreased stride length Gait velocity: decr   General Gait Details: cues for safe proximity to RW and step pattern, pt maintained throughout with no buckling at Rt knee and no LOB. cues intermittently to slow down for safety.  Stairs            Wheelchair Mobility    Modified Rankin (Stroke Patients Only)       Balance Overall balance assessment: Mild deficits observed, not formally tested                                           Pertinent Vitals/Pain Pain Assessment: 0-10 Pain Score: 3  Pain Location: Rt knee Pain Descriptors / Indicators: Aching;Discomfort Pain Intervention(s): Limited activity within patient's tolerance;Monitored during session;Repositioned    Home Living Family/patient expects to be discharged to:: Private residence Living Arrangements: Alone Available Help at Discharge: Friend(s) Type of Home: House Home Access: Stairs to enter     Home Layout: One level Home Equipment: Environmental consultant - 2 wheels;Cane - single point Additional Comments: pt reports friends and neighbors will help him recover    Prior Function Level of Independence: Independent               Hand Dominance   Dominant Hand: Right    Extremity/Trunk Assessment   Upper Extremity  Assessment Upper Extremity Assessment: Overall WFL for tasks assessed    Lower Extremity Assessment Lower Extremity Assessment: RLE deficits/detail RLE Deficits / Details: good quad activaiton, no extensor lag with SLR RLE Sensation: WNL RLE Coordination: WNL    Cervical / Trunk Assessment Cervical / Trunk Assessment: Normal  Communication   Communication: No  difficulties  Cognition Arousal/Alertness: Awake/alert Behavior During Therapy: WFL for tasks assessed/performed Overall Cognitive Status: Within Functional Limits for tasks assessed                                        General Comments      Exercises Total Joint Exercises Ankle Circles/Pumps: AROM;20 reps;Seated;Both   Assessment/Plan    PT Assessment Patient needs continued PT services  PT Problem List Decreased strength;Decreased range of motion;Decreased activity tolerance;Decreased balance;Decreased mobility;Decreased knowledge of use of DME;Decreased knowledge of precautions       PT Treatment Interventions DME instruction;Gait training;Stair training;Functional mobility training;Therapeutic activities;Therapeutic exercise;Balance training;Patient/family education    PT Goals (Current goals can be found in the Care Plan section)  Acute Rehab PT Goals Patient Stated Goal: get back to independence PT Goal Formulation: With patient Time For Goal Achievement: 09/13/21 Potential to Achieve Goals: Good    Frequency 7X/week   Barriers to discharge        Co-evaluation               AM-PAC PT "6 Clicks" Mobility  Outcome Measure Help needed turning from your back to your side while in a flat bed without using bedrails?: A Little Help needed moving from lying on your back to sitting on the side of a flat bed without using bedrails?: A Little Help needed moving to and from a bed to a chair (including a wheelchair)?: A Little Help needed standing up from a chair using your arms (e.g., wheelchair or bedside chair)?: A Little Help needed to walk in hospital room?: A Little Help needed climbing 3-5 steps with a railing? : A Little 6 Click Score: 18    End of Session Equipment Utilized During Treatment: Gait belt Activity Tolerance: Patient tolerated treatment well Patient left: in chair;with call bell/phone within reach;with chair alarm set;with  family/visitor present Nurse Communication: Mobility status PT Visit Diagnosis: Muscle weakness (generalized) (M62.81);Difficulty in walking, not elsewhere classified (R26.2)    Time: [id number] PT Time Calculation (min) (ACUTE ONLY): 26 min   Charges:   PT Evaluation $PT Eval Low Complexity: 1 Low PT Treatments $Gait Training: 8-22 mins        Verner Mould, DPT Acute Rehabilitation Services Office [phone] Pager [phone]   Jacques Navy 09/06/2021, 6:43 PM

## 2021-09-06 NOTE — Progress Notes (Signed)
AssistedDr. Charlene Green with right, ultrasound guided, adductor canal block. Side rails up, monitors on throughout procedure. See vital signs in flow sheet. Tolerated Procedure well. ? ?

## 2021-09-06 NOTE — Anesthesia Postprocedure Evaluation (Signed)
Anesthesia Post Note  Patient: Todd George  Procedure(s) Performed: TOTAL KNEE ARTHROPLASTY (Right: Knee)     Patient location during evaluation: PACU Anesthesia Type: General Level of consciousness: awake Pain management: pain level controlled Vital Signs Assessment: post-procedure vital signs reviewed and stable Respiratory status: spontaneous breathing Cardiovascular status: stable Postop Assessment: no apparent nausea or vomiting Anesthetic complications: no   No notable events documented.  Last Vitals:  Vitals:   09/06/21 1300 09/06/21 1315  BP: (!) 136/53 (!) 151/62  Pulse: 69 67  Resp: 17 16  Temp: (!) 36.4 C   SpO2: 97% 99%    Last Pain:  Vitals:   09/06/21 1315  PainSc: 1                  Anelia Carriveau

## 2021-09-06 NOTE — Anesthesia Procedure Notes (Addendum)
  Anesthesia Regional Block: Adductor canal block   Pre-Anesthetic Checklist: , timeout performed,  Correct Patient, Correct Site, Correct Laterality,  Correct Procedure, Correct Position, site marked,  Risks and benefits discussed,  Surgical consent,  Pre-op evaluation,  At surgeon's request  Laterality: Right  Prep: chloraprep, alcohol swabs       Needles:   Needle Type: Stimulator Needle - 80          Additional Needles:   Narrative:  Start time: 09/06/2021 8:35 AM End time: 09/06/2021 8:50 AM Injection made incrementally with aspirations every 5 mL.  Performed by: Personally  Anesthesiologist: Belinda Block, MD

## 2021-09-06 NOTE — Interval H&P Note (Signed)
History and Physical Interval Note:  09/06/2021 8:57 AM  Todd George  has presented today for surgery, with the diagnosis of Right knee osteoarthritis.  The various methods of treatment have been discussed with the patient and family. After consideration of risks, benefits and other options for treatment, the patient has consented to  Procedure(s): TOTAL KNEE ARTHROPLASTY (Right) as a surgical intervention.  The patient's history has been reviewed, patient examined, no change in status, stable for surgery.  I have reviewed the patient's chart and labs.  Questions were answered to the patient's satisfaction.     Mauri Pole

## 2021-09-06 NOTE — Discharge Instructions (Addendum)
INSTRUCTIONS AFTER JOINT REPLACEMENT   Remove items at home which could result in a fall. This includes throw rugs or furniture in walking pathways ICE to the affected joint every three hours while awake for 30 minutes at a time, for at least the first 3-5 days, and then as needed for pain and swelling.  Continue to use ice for pain and swelling. You may notice swelling that will progress down to the foot and ankle.  This is normal after surgery.  Elevate your leg when you are not up walking on it.   Continue to use the breathing machine you got in the hospital (incentive spirometer) which will help keep your temperature down.  It is common for your temperature to cycle up and down following surgery, especially at night when you are not up moving around and exerting yourself.  The breathing machine keeps your lungs expanded and your temperature down.   DIET:  As you were doing prior to hospitalization, we recommend a well-balanced diet.  DRESSING / WOUND CARE / SHOWERING  Keep the surgical dressing until follow up.  The dressing is water proof, so you can shower without any extra covering.  IF THE DRESSING FALLS OFF or the wound gets wet inside, change the dressing with sterile gauze.  Please use good hand washing techniques before changing the dressing.  Do not use any lotions or creams on the incision until instructed by your surgeon.    ACTIVITY  Increase activity slowly as tolerated, but follow the weight bearing instructions below.   No driving for 6 weeks or until further direction given by your physician.  You cannot drive while taking narcotics.  No lifting or carrying greater than 10 lbs. until further directed by your surgeon. Avoid periods of inactivity such as sitting longer than an hour when not asleep. This helps prevent blood clots.  You may return to work once you are authorized by your doctor.     WEIGHT BEARING   Weight bearing as tolerated with assist device (walker, cane,  etc) as directed, use it as long as suggested by your surgeon or therapist, typically at least 4-6 weeks.   EXERCISES  Results after joint replacement surgery are often greatly improved when you follow the exercise, range of motion and muscle strengthening exercises prescribed by your doctor. Safety measures are also important to protect the joint from further injury. Any time any of these exercises cause you to have increased pain or swelling, decrease what you are doing until you are comfortable again and then slowly increase them. If you have problems or questions, call your caregiver or physical therapist for advice.   Rehabilitation is important following a joint replacement. After just a few days of immobilization, the muscles of the leg can become weakened and shrink (atrophy).  These exercises are designed to build up the tone and strength of the thigh and leg muscles and to improve motion. Often times heat used for twenty to thirty minutes before working out will loosen up your tissues and help with improving the range of motion but do not use heat for the first two weeks following surgery (sometimes heat can increase post-operative swelling).   These exercises can be done on a training (exercise) mat, on the floor, on a table or on a bed. Use whatever works the best and is most comfortable for you.    Use music or television while you are exercising so that the exercises are a pleasant break in your   day. This will make your life better with the exercises acting as a break in your routine that you can look forward to.   Perform all exercises about fifteen times, three times per day or as directed.  You should exercise both the operative leg and the other leg as well.  Exercises include:   Quad Sets - Tighten up the muscle on the front of the thigh (Quad) and hold for 5-10 seconds.   Straight Leg Raises - With your knee straight (if you were given a brace, keep it on), lift the leg to 60  degrees, hold for 3 seconds, and slowly lower the leg.  Perform this exercise against resistance later as your leg gets stronger.  Leg Slides: Lying on your back, slowly slide your foot toward your buttocks, bending your knee up off the floor (only go as far as is comfortable). Then slowly slide your foot back down until your leg is flat on the floor again.  Angel Wings: Lying on your back spread your legs to the side as far apart as you can without causing discomfort.  Hamstring Strength:  Lying on your back, push your heel against the floor with your leg straight by tightening up the muscles of your buttocks.  Repeat, but this time bend your knee to a comfortable angle, and push your heel against the floor.  You may put a pillow under the heel to make it more comfortable if necessary.   A rehabilitation program following joint replacement surgery can speed recovery and prevent re-injury in the future due to weakened muscles. Contact your doctor or a physical therapist for more information on knee rehabilitation.    CONSTIPATION  Constipation is defined medically as fewer than three stools per week and severe constipation as less than one stool per week.  Even if you have a regular bowel pattern at home, your normal regimen is likely to be disrupted due to multiple reasons following surgery.  Combination of anesthesia, postoperative narcotics, change in appetite and fluid intake all can affect your bowels.   YOU MUST use at least one of the following options; they are listed in order of increasing strength to get the job done.  They are all available over the counter, and you may need to use some, POSSIBLY even all of these options:    Drink plenty of fluids (prune juice may be helpful) and high fiber foods Colace 100 mg by mouth twice a day  Senokot for constipation as directed and as needed Dulcolax (bisacodyl), take with full glass of water  Miralax (polyethylene glycol) once or twice a day as  needed.  If you have tried all these things and are unable to have a bowel movement in the first 3-4 days after surgery call either your surgeon or your primary doctor.    If you experience loose stools or diarrhea, hold the medications until you stool forms back up.  If your symptoms do not get better within 1 week or if they get worse, check with your doctor.  If you experience "the worst abdominal pain ever" or develop nausea or vomiting, please contact the office immediately for further recommendations for treatment.   ITCHING:  If you experience itching with your medications, try taking only a single pain pill, or even half a pain pill at a time.  You can also use Benadryl over the counter for itching or also to help with sleep.   TED HOSE STOCKINGS:  Use stockings on both   legs until for at least 2 weeks or as directed by physician office. They may be removed at night for sleeping.  MEDICATIONS:  See your medication summary on the "After Visit Summary" that nursing will review with you.  You may have some home medications which will be placed on hold until you complete the course of blood thinner medication.  It is important for you to complete the blood thinner medication as prescribed.  PRECAUTIONS:  If you experience chest pain or shortness of breath - call 911 immediately for transfer to the hospital emergency department.   If you develop a fever greater that 101 F, purulent drainage from wound, increased redness or drainage from wound, foul odor from the wound/dressing, or calf pain - CONTACT YOUR SURGEON.                                                   FOLLOW-UP APPOINTMENTS:  If you do not already have a post-op appointment, please call the office for an appointment to be seen by your surgeon.  Guidelines for how soon to be seen are listed in your "After Visit Summary", but are typically between 1-4 weeks after surgery.  OTHER INSTRUCTIONS:   Knee Replacement:  Do not place pillow  under knee, focus on keeping the knee straight while resting. CPM instructions: 0-90 degrees, 2 hours in the morning, 2 hours in the afternoon, and 2 hours in the evening. Place foam block, curve side up under heel at all times except when in CPM or when walking.  DO NOT modify, tear, cut, or change the foam block in any way.  POST-OPERATIVE OPIOID TAPER INSTRUCTIONS: It is important to wean off of your opioid medication as soon as possible. If you do not need pain medication after your surgery it is ok to stop day one. Opioids include: Codeine, Hydrocodone(Norco, Vicodin), Oxycodone(Percocet, oxycontin) and hydromorphone amongst others.  Long term and even short term use of opiods can cause: Increased pain response Dependence Constipation Depression Respiratory depression And more.  Withdrawal symptoms can include Flu like symptoms Nausea, vomiting And more Techniques to manage these symptoms Hydrate well Eat regular healthy meals Stay active Use relaxation techniques(deep breathing, meditating, yoga) Do Not substitute Alcohol to help with tapering If you have been on opioids for less than two weeks and do not have pain than it is ok to stop all together.  Plan to wean off of opioids This plan should start within one week post op of your joint replacement. Maintain the same interval or time between taking each dose and first decrease the dose.  Cut the total daily intake of opioids by one tablet each day Next start to increase the time between doses. The last dose that should be eliminated is the evening dose.   MAKE SURE YOU:  Understand these instructions.  Get help right away if you are not doing well or get worse.    Thank you for letting us be a part of your medical care team.  It is a privilege we respect greatly.  We hope these instructions will help you stay on track for a fast and full recovery!     Information on my medicine - XARELTO (Rivaroxaban)  This  medication education was reviewed with me or my healthcare representative as part of my discharge preparation.      Why was Xarelto prescribed for you? Xarelto was prescribed for you to reduce the risk of blood clots forming after orthopedic surgery. The medical term for these abnormal blood clots is venous thromboembolism (VTE).  What do you need to know about xarelto ? Take your Xarelto ONCE DAILY at the same time every day. You may take it either with or without food.  If you have difficulty swallowing the tablet whole, you may crush it and mix in applesauce just prior to taking your dose.  Take Xarelto exactly as prescribed by your doctor and DO NOT stop taking Xarelto without talking to the doctor who prescribed the medication.  Stopping without other VTE prevention medication to take the place of Xarelto may increase your risk of developing a clot.  After discharge, you should have regular check-up appointments with your healthcare provider that is prescribing your Xarelto.    What do you do if you miss a dose? If you miss a dose, take it as soon as you remember on the same day then continue your regularly scheduled once daily regimen the next day. Do not take two doses of Xarelto on the same day.   Important Safety Information A possible side effect of Xarelto is bleeding. You should call your healthcare provider right away if you experience any of the following: Bleeding from an injury or your nose that does not stop. Unusual colored urine (red or dark brown) or unusual colored stools (red or black). Unusual bruising for unknown reasons. A serious fall or if you hit your head (even if there is no bleeding).  Some medicines may interact with Xarelto and might increase your risk of bleeding while on Xarelto. To help avoid this, consult your healthcare provider or pharmacist prior to using any new prescription or non-prescription medications, including herbals, vitamins,  non-steroidal anti-inflammatory drugs (NSAIDs) and supplements.  This website has more information on Xarelto: www.xarelto.com.   

## 2021-09-07 ENCOUNTER — Encounter (HOSPITAL_COMMUNITY): Payer: Self-pay | Admitting: Orthopedic Surgery

## 2021-09-07 DIAGNOSIS — Z96651 Presence of right artificial knee joint: Secondary | ICD-10-CM | POA: Diagnosis not present

## 2021-09-07 DIAGNOSIS — Z96652 Presence of left artificial knee joint: Secondary | ICD-10-CM | POA: Diagnosis not present

## 2021-09-07 DIAGNOSIS — E119 Type 2 diabetes mellitus without complications: Secondary | ICD-10-CM | POA: Diagnosis not present

## 2021-09-07 DIAGNOSIS — Z79899 Other long term (current) drug therapy: Secondary | ICD-10-CM | POA: Diagnosis not present

## 2021-09-07 DIAGNOSIS — F17210 Nicotine dependence, cigarettes, uncomplicated: Secondary | ICD-10-CM | POA: Diagnosis not present

## 2021-09-07 DIAGNOSIS — J449 Chronic obstructive pulmonary disease, unspecified: Secondary | ICD-10-CM | POA: Diagnosis not present

## 2021-09-07 DIAGNOSIS — Z85828 Personal history of other malignant neoplasm of skin: Secondary | ICD-10-CM | POA: Diagnosis not present

## 2021-09-07 DIAGNOSIS — M1711 Unilateral primary osteoarthritis, right knee: Secondary | ICD-10-CM | POA: Diagnosis not present

## 2021-09-07 DIAGNOSIS — I10 Essential (primary) hypertension: Secondary | ICD-10-CM | POA: Diagnosis not present

## 2021-09-07 LAB — BASIC METABOLIC PANEL
Anion gap: 8 (ref 5–15)
BUN: 42 mg/dL — ABNORMAL HIGH (ref 8–23)
CO2: 29 mmol/L (ref 22–32)
Calcium: 10 mg/dL (ref 8.9–10.3)
Chloride: 102 mmol/L (ref 98–111)
Creatinine, Ser: 1.62 mg/dL — ABNORMAL HIGH (ref 0.61–1.24)
GFR, Estimated: 42 mL/min — ABNORMAL LOW (ref >60)
Glucose, Bld: 138 mg/dL — ABNORMAL HIGH (ref 70–99)
Potassium: 5.1 mmol/L (ref 3.5–5.1)
Sodium: 139 mmol/L (ref 135–145)

## 2021-09-07 LAB — CBC
HCT: 37.9 % — ABNORMAL LOW (ref 39.0–52.0)
Hemoglobin: 12.6 g/dL — ABNORMAL LOW (ref 13.0–17.0)
MCH: 30.4 pg (ref 26.0–34.0)
MCHC: 33.2 g/dL (ref 30.0–36.0)
MCV: 91.3 fL (ref 80.0–100.0)
Platelets: 190 10*3/uL (ref 150–400)
RBC: 4.15 MIL/uL — ABNORMAL LOW (ref 4.22–5.81)
RDW: 14 % (ref 11.5–15.5)
WBC: 15 10*3/uL — ABNORMAL HIGH (ref 4.0–10.5)
nRBC: 0 % (ref 0.0–0.2)

## 2021-09-07 MED ORDER — RIVAROXABAN 10 MG PO TABS: 10.0000 mg | ORAL_TABLET | Freq: Every day | ORAL | 0 refills | Status: AC

## 2021-09-07 MED ORDER — HYDROCODONE-ACETAMINOPHEN 5-325 MG PO TABS: 1.0000 | ORAL_TABLET | ORAL | 0 refills | Status: AC | PRN

## 2021-09-07 MED ORDER — POLYETHYLENE GLYCOL 3350 17 G PO PACK: 17.0000 g | PACK | Freq: Every day | ORAL | 0 refills | Status: AC | PRN

## 2021-09-07 MED ORDER — DOCUSATE SODIUM 100 MG PO CAPS: 100.0000 mg | ORAL_CAPSULE | Freq: Two times a day (BID) | ORAL | 0 refills | Status: AC

## 2021-09-07 MED ORDER — METHOCARBAMOL 500 MG PO TABS: 500.0000 mg | ORAL_TABLET | Freq: Four times a day (QID) | ORAL | 0 refills | Status: AC | PRN

## 2021-09-07 NOTE — Progress Notes (Signed)
Subjective: 1 Day Post-Op Procedure(s) (LRB): TOTAL KNEE ARTHROPLASTY (Right) Patient reports pain as mild.   Patient seen in rounds by Dr. Alvan Dame. Patient is well, and has had no acute complaints or problems. No acute events overnight. Voiding without difficulty. Patient ambulated 40 feet with PT.  We will continue therapy today.   Objective: Vital signs in last 24 hours: Temp:  [97.4 F (36.3 C)-98.5 F (36.9 C)] 98.5 F (36.9 C) (09/14 0625) Pulse Rate:  [45-73] 53 (09/14 0625) Resp:  [11-18] 17 (09/14 0625) BP: (108-186)/(53-94) 119/75 (09/14 0625) SpO2:  [90 %-100 %] 96 % (09/14 0625) Weight:  [89.8 kg] 89.8 kg (09/13 0833)  Intake/Output from previous day:  Intake/Output Summary (Last 24 hours) at 09/07/2021 0736 Last data filed at 09/07/2021 0644 Gross per 24 hour  Intake 2548.16 ml  Output 1100 ml  Net 1448.16 ml     Intake/Output this shift: No intake/output data recorded.  Labs: Recent Labs    09/07/21 0309  HGB 12.6*   Recent Labs    09/07/21 0309  WBC 15.0*  RBC 4.15*  HCT 37.9*  PLT 190   Recent Labs    09/07/21 0309  NA 139  K 5.1  CL 102  CO2 29  BUN 42*  CREATININE 1.62*  GLUCOSE 138*  CALCIUM 10.0   No results for input(s): LABPT, INR in the last 72 hours.  Exam: General - Patient is Alert and Oriented Extremity - Neurologically intact Sensation intact distally Intact pulses distally Dorsiflexion/Plantar flexion intact Dressing - dressing C/D/I Motor Function - intact, moving foot and toes well on exam.   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   BPH (benign prostatic hyperplasia)    Cancer (HCC)    skin cancer   Carotid artery disease (HCC)    Cellulitis    Chronic back pain    Colon polyps 01/04/2016   COPD (chronic obstructive pulmonary disease) ([place])    2ppd   Diabetes mellitus without complication ([place])    Duodenal erosion 01/02/2016    Gastric erosion with bleeding 01/02/2016   Hyperlipidemia    Hypertension    OSA on CPAP    not used machine in over 10 yrs    Assessment/Plan: 1 Day Post-Op Procedure(s) (LRB): TOTAL KNEE ARTHROPLASTY (Right) Active Problems:   S/P total knee arthroplasty, right  Estimated body mass index is 31.01 kg/m as calculated from the following:   Height as of this encounter: '5\' 7"'$  (1.702 m).   Weight as of this encounter: 89.8 kg. Advance diet Up with therapy D/C IV fluids   Patient's anticipated LOS is less than 2 midnights, meeting these requirements: - Younger than 65 - Lives within 1 hour of care - Has a competent adult at home to recover with post-op recover - NO history of  - Chronic pain requiring opiods  - Diabetes  - Coronary Artery Disease  - Heart failure  - Heart attack  - Stroke  - DVT/VTE  - Cardiac arrhythmia  - Respiratory Failure/COPD  - Renal failure  - Anemia  - Advanced Liver disease     DVT Prophylaxis - Aspirin Weight bearing as tolerated.  Plan is to go Home after hospital stay. Plan for discharge today following 1-2 sessions of PT as long as they are meeting their goals. Patient is scheduled for OPPT. Follow up in the office in 2 weeks.   Griffith Citron, PA-C  Orthopedic Surgery [phone] 09/07/2021, 7:36 AM

## 2021-09-07 NOTE — Plan of Care (Signed)
  Problem: Education: Goal: Knowledge of General Education information will improve Description: Including pain rating scale, medication(s)/side effects and non-pharmacologic comfort measures Outcome: Progressing   Problem: Activity: Goal: Risk for activity intolerance will decrease Outcome: Progressing   Problem: Pain Managment: Goal: General experience of comfort will improve Outcome: Progressing   

## 2021-09-07 NOTE — Progress Notes (Signed)
Physical Therapy Treatment Patient Details Name: Todd George MRN: [id number] DOB: 08/26/1941 Today's Date: 09/07/2021   History of Present Illness Patient is 80 y.o. male s/p Rt TKA on 09/06/21 with PMH significant for OA, BPH, cancer, COPD, DM, HLD, HTN, OSA, CAD, back pain, Lt TKA.    PT Comments    Patient progressing well with mobility and ambulated ~220' with RW and no LOB throughout. Patient also educated on safe stair negotiation in preparation for safe return home. Educated on HEP for ROM and circulation. All questions addressed. Pt is at safe level for discharge home with assist from family/friends. Acute PT will continue to progress during his stay.    Recommendations for follow up therapy are one component of a multi-disciplinary discharge planning process, led by the attending physician.  Recommendations may be updated based on patient status, additional functional criteria and insurance authorization.  Follow Up Recommendations  Follow surgeon's recommendation for DC plan and follow-up therapies     Equipment Recommendations  None recommended by PT    Recommendations for Other Services       Precautions / Restrictions Precautions Precautions: Fall Restrictions Weight Bearing Restrictions: No Other Position/Activity Restrictions: WBAT     Mobility  Bed Mobility               General bed mobility comments: pt OOB in recliner    Transfers Overall transfer level: Needs assistance Equipment used: Rolling walker (2 wheeled) Transfers: Sit to/from Stand Sit to Stand: Min guard;Supervision         General transfer comment: pt able to rise with bil UE use for power up, cues for safety as pt eager/impulsive to move at times.  Ambulation/Gait Ambulation/Gait assistance: Min guard;Supervision Gait Distance (Feet): 220 Feet Assistive device: Rolling walker (2 wheeled) Gait Pattern/deviations: Step-to pattern;Decreased stride length;Step-through  pattern Gait velocity: decr   General Gait Details: pt maintained safe step pattern and proximity to RW throughout with step through pattern.   Stairs Stairs: Yes Stairs assistance: Min guard;Supervision Stair Management: Two rails;Step to pattern;Forwards Number of Stairs: 2 General stair comments: cues for safe step sequence "up wtih good, down with bad" no overt LOB noted. pt using bil hand rails, has 1 rail and SPC at home.   Wheelchair Mobility    Modified Rankin (Stroke Patients Only)       Balance Overall balance assessment: Mild deficits observed, not formally tested                                          Cognition Arousal/Alertness: Awake/alert Behavior During Therapy: WFL for tasks assessed/performed Overall Cognitive Status: Within Functional Limits for tasks assessed                                        Exercises Total Joint Exercises Ankle Circles/Pumps: AROM;20 reps;Seated;Both Quad Sets: AROM;Right;5 reps;Seated Short Arc Quad: AROM;Right;5 reps;Seated Heel Slides: AROM;Right;5 reps;Seated Hip ABduction/ADduction: AROM;Right;5 reps;Seated    General Comments        Pertinent Vitals/Pain Pain Assessment: 0-10 Pain Score: 1  Pain Location: Rt knee Pain Descriptors / Indicators: Aching;Discomfort Pain Intervention(s): Limited activity within patient's tolerance;Monitored during session;Repositioned    Home Living  Prior Function            PT Goals (current goals can now be found in the care plan section) Acute Rehab PT Goals Patient Stated Goal: get back to independence PT Goal Formulation: With patient Time For Goal Achievement: 09/13/21 Potential to Achieve Goals: Good Progress towards PT goals: Progressing toward goals    Frequency    7X/week      PT Plan Current plan remains appropriate    Co-evaluation              AM-PAC PT "6 Clicks" Mobility    Outcome Measure  Help needed turning from your back to your side while in a flat bed without using bedrails?: A Little Help needed moving from lying on your back to sitting on the side of a flat bed without using bedrails?: A Little Help needed moving to and from a bed to a chair (including a wheelchair)?: A Little Help needed standing up from a chair using your arms (e.g., wheelchair or bedside chair)?: A Little Help needed to walk in hospital room?: A Little Help needed climbing 3-5 steps with a railing? : A Little 6 Click Score: 18    End of Session Equipment Utilized During Treatment: Gait belt Activity Tolerance: Patient tolerated treatment well Patient left: in chair;with call bell/phone within reach;with chair alarm set;with family/visitor present Nurse Communication: Mobility status PT Visit Diagnosis: Muscle weakness (generalized) (M62.81);Difficulty in walking, not elsewhere classified (R26.2)     Time: [id number] PT Time Calculation (min) (ACUTE ONLY): 23 min  Charges:  $Gait Training: 8-22 mins $Therapeutic Exercise: 8-22 mins                     . Todd George, DPT Acute Rehabilitation Services Office [phone] Pager [phone]    Todd George 09/07/2021, 12:25 PM

## 2021-09-07 NOTE — TOC Transition Note (Signed)
Transition of Care [facility]) - CM/SW Discharge Note  Patient Details  Name: Todd George MRN: [id number] Date of Birth: 1941/08/17  Transition of [facility]) CM/SW Contact:  Sherie Don, LCSW Phone Number: 09/07/2021, 9:56 AM  Clinical Narrative: Patient is expected to discharge after working with PT. CSW met with patient to review discharge plan and needs. Patient will discharge home with [facility] at [facility]). Patient will need a rolling walker, but declined the 3N1 as he would have to pay out-of-pocket now that Medicare is not covering most 3N1s. [employer] to deliver walker to patient's room. TOC signing off.  Final next level of care: OP Rehab Barriers to Discharge: No Barriers Identified  Patient Goals and CMS Choice Patient states their goals for this hospitalization and ongoing recovery are:: Discharge home with [place] CMS Medicare.gov Compare Post Acute Care list provided to:: Patient Choice offered to / list presented to : Patient  Discharge Plan and Services          DME Arranged: Walker rolling DME Agency: [employer] Representative spoke with at DME Agency: Prearranged in orthopedist's office HH Arranged: NA [place] Agency: NA  Readmission Risk Interventions No flowsheet data found.

## 2021-09-09 ENCOUNTER — Ambulatory Visit (HOSPITAL_COMMUNITY): Payer: Medicare HMO | Admitting: Physical Therapy

## 2021-09-12 ENCOUNTER — Other Ambulatory Visit: Payer: Self-pay

## 2021-09-12 ENCOUNTER — Encounter (HOSPITAL_COMMUNITY): Payer: Self-pay

## 2021-09-12 ENCOUNTER — Ambulatory Visit (HOSPITAL_COMMUNITY): Payer: Medicare HMO | Attending: Orthopedic Surgery

## 2021-09-12 DIAGNOSIS — R29898 Other symptoms and signs involving the musculoskeletal system: Secondary | ICD-10-CM | POA: Diagnosis not present

## 2021-09-12 DIAGNOSIS — R262 Difficulty in walking, not elsewhere classified: Secondary | ICD-10-CM | POA: Diagnosis not present

## 2021-09-12 DIAGNOSIS — M6281 Muscle weakness (generalized): Secondary | ICD-10-CM | POA: Insufficient documentation

## 2021-09-12 DIAGNOSIS — R2689 Other abnormalities of gait and mobility: Secondary | ICD-10-CM | POA: Insufficient documentation

## 2021-09-12 NOTE — Therapy (Signed)
[place], [place], [address] Phone: [phone]   Fax:  [phone]  Physical Therapy Evaluation  Patient Details  Name: Todd George MRN: [id number] Date of Birth: August 13, 1941 Referring Provider (PT): Dr. 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   BPH (benign prostatic hyperplasia)    Cancer ([place])    skin cancer   Carotid artery disease ([place])    Cellulitis    Chronic back pain    Colon polyps 01/04/2016   COPD (chronic obstructive pulmonary disease) ([place])    2ppd   Diabetes mellitus without complication ([place])    Duodenal erosion 01/02/2016   Gastric erosion with bleeding 01/02/2016   Hyperlipidemia    Hypertension    OSA on CPAP    not used machine in over 10 yrs    Past Surgical History:  Procedure Laterality Date   CHOLECYSTECTOMY     COLONOSCOPY N/A 01/03/2016   RMR: Active bleeding seen arising from the small bowel. Multiple cecal/ascending colon AVMs ablated. 1.5 cm carpet polyp in the ascending segment status post piecemeal snare polypectomy and tattooing. Innocent appearing colonic diverticulosis.   COLONOSCOPY N/A 06/14/2016   Procedure: COLONOSCOPY;  Surgeon: Daneil Dolin, MD;  [facility];  Service: Endoscopy;  Laterality: N/A;  1200   COLONOSCOPY WITH PROPOFOL N/A 11/28/2018   Procedure: COLONOSCOPY WITH PROPOFOL;  Surgeon: Daneil Dolin, MD;   [facility];  Service: Endoscopy;  Laterality: N/A;  8:30am   ESOPHAGOGASTRODUODENOSCOPY N/A 01/01/2016   RMR: Multiple gastric and duodenal erosions likely NSAID related.   GIVENS CAPSULE STUDY N/A 01/03/2016   Incomplete study, capsule never reach cecum. Scattered erosions and AVMs throughout the small bowel.   JOINT REPLACEMENT     POLYPECTOMY  11/28/2018   Procedure: POLYPECTOMY;  Surgeon: Daneil Dolin, MD;  [facility];  Service: Endoscopy;;  ascending colon   REPLACEMENT TOTAL KNEE Left    SHOULDER ARTHROSCOPY WITH DISTAL CLAVICLE RESECTION Right 05/28/2015   Procedure: SHOULDER ARTHROSCOPY WITH DISTAL CLAVICLE RESECTION;  Surgeon: Melrose Nakayama, MD;  Location: [place];  Service: Orthopedics;  Laterality: Right;   SHOULDER ARTHROSCOPY WITH SUBACROMIAL DECOMPRESSION Right 05/28/2015   Procedure: SHOULDER ARTHROSCOPY WITH SUBACROMIAL DECOMPRESSION;  Surgeon: Melrose Nakayama, MD;  Location: [place];  Service: Orthopedics;  Laterality: Right;   THROAT SURGERY  1965   TOTAL KNEE ARTHROPLASTY Right 09/06/2021   Procedure: TOTAL KNEE ARTHROPLASTY;  Surgeon: Paralee Cancel, MD;  Location[facility];  Service: Orthopedics;  Laterality: Right;    There were no vitals filed for this visit.    Subjective Assessment - 09/12/21 1438     Subjective Right TKR on 09/06/21 and been home since 09/07/21 and presents to rehab  clinic for initiation of rehabiliation. Pt notes continued pain and difficulty with walking    Currently in Pain? Yes    Pain Score 3     Pain Location Knee    Pain Orientation Right    Pain Descriptors / Indicators Aching;Sore    Pain Type Surgical pain    Aggravating Factors  walking, standing                [facility] PT Assessment - 09/12/21 0001       Assessment   Medical Diagnosis Right TKR    Referring Provider (PT) Dr. Paralee Cancel    Onset Date/Surgical Date 09/06/21      Balance Screen   Has the patient fallen  in the past 6 months Yes    How many times? 1    Has the patient had a decrease in activity level because of a fear of falling?  No    Is the patient reluctant to leave their home because of a fear of falling?  No      Home Environment   Living Environment Private residence    Living Arrangements Alone    Available Help at Discharge Neighbor    Type of [place] to enter    Home Layout One level    [place] - 2 wheels      Prior Function   Level of [place] Retired      Observation/Other Assessments   Focus on Therapeutic Outcomes (FOTO)  28.8% function      ROM / Strength   AROM / PROM / Strength AROM;Strength      AROM   AROM Assessment Site Knee    Right/Left Knee Right    Right Knee Extension 21   lacking   Right Knee Flexion 82      Strength   Strength Assessment Site Knee    Right/Left Knee Right    Right Knee Flexion 3/5    Right Knee Extension 3-/5      Bed Mobility   Bed Mobility Sit to Supine    Sit to Supine Minimal Assistance - Patient > 75%      Transfers   Transfers Sit to Stand    Sit to Stand 5: Supervision      Ambulation/Gait   Ambulation/Gait Yes    Ambulation/Gait Assistance 6: Modified independent (Device/Increase time)    Ambulation Distance (Feet) 100 Feet    Assistive device Rolling walker    Gait Pattern Step-to pattern;Antalgic    Stairs Yes    Stairs Assistance 4: Min guard    Stair Management Technique Step to pattern    Gait Comments 2MWT                        Objective measurements completed on examination: See above findings.       [facility] Adult PT Treatment/Exercise - 09/12/21 0001       Exercises   Exercises Knee/Hip      Knee/Hip Exercises: Supine   Quad Sets Strengthening;Right;2 sets;10 reps    Heel Slides AAROM;Right;2 sets;10 reps    Heel Prop for Knee Extension 1 minute   30 sec increments                    PT  Education - 09/12/21 1455     Education Details assessment findings, HEP    Person(s)  Educated Patient    Methods Explanation;Handout    Comprehension Verbalized understanding              PT Short Term Goals - 09/12/21 1519       PT SHORT TERM GOAL #1   Title Patient will be independent with HEP in order to improve functional outcomes.    Time 3    Period Weeks    Status New    Target Date 10/03/21      PT SHORT TERM GOAL #2   Title Demonstrate right knee ROM to full extension, 100 degrees flexion to enable normalized kinematics    Baseline lacking 21 degree extension, 82 flexion    Time 3    Period Weeks    Status New    Target Date 10/03/21               PT Long Term Goals - 09/12/21 1520       PT LONG TERM GOAL #1   Title Patient will improve FOTO score by at least 10 points in order to indicate improved tolerance to activity.    Baseline 28% function    Time 6    Period Weeks    Status New    Target Date 10/24/21      PT LONG TERM GOAL #2   Title Patient will be able to ambulate at least 250 feet in 2MWT in order to demonstrate improved gait speed for community ambulation.    Baseline 100 ft with RW    Time 6    Period Weeks    Status New    Target Date 10/24/21                    Plan - 09/12/21 1516     Clinical Impression Statement 80 yo male with hx of right TKR on 09/06/21 and demonstrates new onset of decline in functional independence and mobility, ROM and strength limitations RLE, difficulty in walking, pain, reduced functional activity tolerance, increased need for caregiver assistance    Personal Factors and Comorbidities Age;Comorbidity 1;Time since onset of injury/illness/exacerbation    Comorbidities Tbco abuse    Examination-Activity Limitations Bed Mobility;Carry;Lift;Toileting;Stand;Stairs;Squat;Reach Overhead;Transfers;Locomotion Level    Examination-Participation Restrictions Cleaning;Community Activity;Laundry;Yard  Work;Shop;Meal Prep    Stability/Clinical Decision Making Stable/Uncomplicated    Clinical Decision Making Low    Rehab Potential Good    PT Frequency 3x / week    PT Duration 6 weeks    PT Treatment/Interventions ADLs/Self Care Home Management;Gait training;Stair training;Functional mobility training;Therapeutic activities;Therapeutic exercise;Balance training;Patient/family education;Neuromuscular re-education;Manual techniques;Passive range of motion;Dry needling;Spinal Manipulations;Joint Manipulations    PT Next Visit Plan Continue with TKR rehab    PT Home Exercise Plan QS, heel slides, heel propr for extensoin    Consulted and Agree with Plan of Care Patient             Patient will benefit from skilled therapeutic intervention in order to improve the following deficits and impairments:  Abnormal gait, Decreased activity tolerance, Decreased mobility, Decreased range of motion, Decreased strength, Difficulty walking, Increased edema, Pain  Visit Diagnosis: Difficulty in walking, not elsewhere classified  Muscle weakness (generalized)  Other abnormalities of gait and mobility  Other symptoms and signs involving the musculoskeletal system     Problem List Patient Active Problem List   Diagnosis Date Noted   S/P total knee arthroplasty, 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   Diverticulosis of colon without hemorrhage    Solitary pulmonary nodule 02/10/2016  Abnormal CT scan 02/02/2016   Chronic GI bleeding 02/01/2016   Anemia due to chronic blood loss 02/01/2016   Colon polyps 01/04/2016   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 Sherlyn Lees, PT, DPT Physical Therapist- [place] Office Number: [phone]   [place] [address] [place], [place], [address] Phone: [phone]   Fax:  [phone]  Name: INEZ SAYLES MRN: [id number] Date of Birth: [birth year]

## 2021-09-14 ENCOUNTER — Telehealth (HOSPITAL_COMMUNITY): Payer: Self-pay | Admitting: Physical Therapy

## 2021-09-14 ENCOUNTER — Encounter (HOSPITAL_COMMUNITY): Payer: Medicare HMO | Admitting: Physical Therapy

## 2021-09-14 NOTE — Telephone Encounter (Signed)
He called to cx - he went to see this MD today and he is given out- will return on Friday

## 2021-09-16 ENCOUNTER — Other Ambulatory Visit: Payer: Self-pay

## 2021-09-16 ENCOUNTER — Ambulatory Visit (HOSPITAL_COMMUNITY): Payer: Medicare HMO

## 2021-09-16 DIAGNOSIS — R29898 Other symptoms and signs involving the musculoskeletal system: Secondary | ICD-10-CM | POA: Diagnosis not present

## 2021-09-16 DIAGNOSIS — M6281 Muscle weakness (generalized): Secondary | ICD-10-CM | POA: Diagnosis not present

## 2021-09-16 DIAGNOSIS — R2689 Other abnormalities of gait and mobility: Secondary | ICD-10-CM | POA: Diagnosis not present

## 2021-09-16 DIAGNOSIS — R262 Difficulty in walking, not elsewhere classified: Secondary | ICD-10-CM

## 2021-09-16 NOTE — Therapy (Signed)
[place], [place], [address] Phone: [phone]   Fax:  [phone]  Physical Therapy Treatment  Patient Details  Name: Todd George MRN: [id number] Date of Birth: 1941/06/30 Referring Provider (PT): Dr. 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   BPH (benign prostatic hyperplasia)    Cancer ([place])    skin cancer   Carotid artery disease ([place])    Cellulitis    Chronic back pain    Colon polyps 01/04/2016   COPD (chronic obstructive pulmonary disease) ([place])    2ppd   Diabetes mellitus without complication ([place])    Duodenal erosion 01/02/2016   Gastric erosion with bleeding 01/02/2016   Hyperlipidemia    Hypertension    OSA on CPAP    not used machine in over 10 yrs    Past Surgical History:  Procedure Laterality Date   CHOLECYSTECTOMY     COLONOSCOPY N/A 01/03/2016   RMR: Active bleeding seen arising from the small bowel. Multiple cecal/ascending colon AVMs ablated. 1.5 cm carpet polyp in the ascending segment status post piecemeal snare polypectomy and tattooing. Innocent appearing colonic diverticulosis.   COLONOSCOPY N/A 06/14/2016   Procedure: COLONOSCOPY;  Surgeon: Daneil Dolin, MD;  [facility];  Service: Endoscopy;  Laterality: N/A;  1200   COLONOSCOPY WITH PROPOFOL N/A 11/28/2018   Procedure: COLONOSCOPY WITH PROPOFOL;  Surgeon: Daneil Dolin, MD;  [facility];  Service: Endoscopy;  Laterality: N/A;  8:30am   ESOPHAGOGASTRODUODENOSCOPY N/A 01/01/2016   RMR: Multiple gastric and duodenal erosions likely NSAID related.   GIVENS CAPSULE STUDY N/A 01/03/2016   Incomplete study, capsule never reach cecum. Scattered erosions and AVMs throughout the small bowel.   JOINT REPLACEMENT     POLYPECTOMY  11/28/2018   Procedure: POLYPECTOMY;  Surgeon: Daneil Dolin, MD;  [facility];  Service: Endoscopy;;  ascending colon   REPLACEMENT TOTAL KNEE Left    SHOULDER ARTHROSCOPY WITH DISTAL CLAVICLE RESECTION Right 05/28/2015   Procedure: SHOULDER ARTHROSCOPY WITH DISTAL CLAVICLE RESECTION;  Surgeon: Melrose Nakayama, MD;  Location: [place];  Service: Orthopedics;  Laterality: Right;   SHOULDER ARTHROSCOPY WITH SUBACROMIAL DECOMPRESSION Right 05/28/2015   Procedure: SHOULDER ARTHROSCOPY WITH SUBACROMIAL DECOMPRESSION;  Surgeon: Melrose Nakayama, MD;  Location: [place];  Service: Orthopedics;  Laterality: Right;   THROAT SURGERY  1965   TOTAL KNEE ARTHROPLASTY Right 09/06/2021   Procedure: TOTAL KNEE ARTHROPLASTY;  Surgeon: Paralee Cancel, MD;  [facility];  Service: Orthopedics;  Laterality: Right;    There were no vitals filed for this visit.   Subjective Assessment - 09/16/21 1438     Subjective Continued stiffness and pain in right knee limiting activituy. Notes pain varies based on  activity    Currently in Pain? Yes    Pain Score 4     Pain Location Knee    Pain Orientation Right    Pain Descriptors / Indicators Aching;Sore    Pain Type Surgical pain                [facility] PT Assessment - 09/16/21 0001       Assessment   Medical Diagnosis Right TKR    Referring Provider (PT) Dr. Paralee Cancel    Onset Date/Surgical Date 09/06/21                           [facility] Adult PT Treatment/Exercise - 09/16/21 0001       Knee/Hip Exercises: Stretches   Knee: Self-Stretch to  increase Flexion Right    Knee: Self-Stretch Limitations 2x10 3 sec hold      Knee/Hip Exercises: Standing   Other Standing Knee Exercises static standing on airex pad 2x60 sec      Knee/Hip Exercises: Seated   Heel Slides AAROM;Right    Heel Slides Limitations 2 min      Knee/Hip Exercises: Supine   Quad Sets Strengthening;Right;3 sets;10 reps    Heel Slides AAROM;Right;2 sets;10 reps    Heel Prop for Knee Extension 2 minutes   30 sec increments   Knee Extension AROM    Knee Extension Limitations 10   lacking   Knee Flexion AROM;Right    Knee Flexion Limitations 109                     PT Education - 09/16/21 1522     Education Details discussion regarding HEP performance and need for increased frequency with less duration for knee extension activities to improve compliance and tolerance    Person(s) Educated Patient    Methods Explanation    Comprehension Verbalized understanding              PT Short Term Goals - 09/12/21 1519       PT SHORT TERM GOAL #1   Title Patient will be independent with HEP in order to improve functional outcomes.    Time 3    Period Weeks    Status New    Target Date 10/03/21      PT SHORT TERM GOAL #2   Title Demonstrate right knee ROM to full extension, 100 degrees flexion to enable normalized kinematics    Baseline lacking 21 degree extension, 82 flexion    Time 3    Period Weeks    Status New    Target Date 10/03/21               PT Long Term Goals - 09/12/21 1520       PT LONG TERM GOAL #1   Title Patient will improve FOTO score by at least 10 points in order to indicate improved tolerance to activity.    Baseline 28% function    Time 6    Period Weeks    Status New    Target Date 10/24/21      PT LONG TERM GOAL #2   Title Patient will be able to ambulate at least 250 feet in 2MWT in order to demonstrate improved gait speed for community ambulation.    Baseline 100 ft with RW    Time 6    Period  Weeks    Status New    Target Date 10/24/21  Plan - 09/16/21 1519     Clinical Impression Statement Progressing with POC details and demonstrating increased right knee flexion today and improved weight acceptance RLE during stance phase gait.  Continues to demo difficulty and decreased tolerance for right knee extension. Continued sessions indicated to progress right knee ROM and strength to normalize gait pattern and reduce need for AD    Personal Factors and Comorbidities Age;Comorbidity 1;Time since onset of injury/illness/exacerbation    Comorbidities Tbco abuse    Examination-Activity Limitations Bed Mobility;Carry;Lift;Toileting;Stand;Stairs;Squat;Reach Overhead;Transfers;Locomotion Level    Examination-Participation Restrictions Cleaning;Community Activity;Laundry;Yard Work;Shop;Meal Prep    Stability/Clinical Decision Making Stable/Uncomplicated    Rehab Potential Good    PT Frequency 3x / week    PT Duration 6 weeks    PT Treatment/Interventions ADLs/Self Care Home Management;Gait training;Stair training;Functional mobility training;Therapeutic activities;Therapeutic exercise;Balance training;Patient/family education;Neuromuscular re-education;Manual techniques;Passive range of motion;Dry needling;Spinal Manipulations;Joint Manipulations    PT Next Visit Plan Continue with TKR rehab    PT Home Exercise Plan QS, heel slides, heel propr for extensoin    Consulted and Agree with Plan of Care Patient             Patient will benefit from skilled therapeutic intervention in order to improve the following deficits and impairments:  Abnormal gait, Decreased activity tolerance, Decreased mobility, Decreased range of motion, Decreased strength, Difficulty walking, Increased edema, Pain  Visit Diagnosis: Difficulty in walking, not elsewhere classified  Muscle weakness (generalized)  Other abnormalities of gait and mobility  Other symptoms and signs  involving the musculoskeletal system     Problem List Patient Active Problem List   Diagnosis Date Noted   S/P total knee arthroplasty, 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   Diverticulosis of colon without hemorrhage    Solitary pulmonary nodule 02/10/2016   Abnormal CT scan 02/02/2016   Chronic GI bleeding 02/01/2016   Anemia due to chronic blood loss 02/01/2016   Colon polyps 01/04/2016   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 [place], [place], [address] Phone: [phone]   Fax:  [phone]  Name: Todd George MRN: [id number] Date of Birth: 04/03/1941

## 2021-09-19 ENCOUNTER — Ambulatory Visit (HOSPITAL_COMMUNITY): Payer: Medicare HMO

## 2021-09-19 ENCOUNTER — Other Ambulatory Visit: Payer: Self-pay

## 2021-09-19 DIAGNOSIS — M6281 Muscle weakness (generalized): Secondary | ICD-10-CM | POA: Diagnosis not present

## 2021-09-19 DIAGNOSIS — R29898 Other symptoms and signs involving the musculoskeletal system: Secondary | ICD-10-CM | POA: Diagnosis not present

## 2021-09-19 DIAGNOSIS — R2689 Other abnormalities of gait and mobility: Secondary | ICD-10-CM | POA: Diagnosis not present

## 2021-09-19 DIAGNOSIS — R262 Difficulty in walking, not elsewhere classified: Secondary | ICD-10-CM

## 2021-09-19 NOTE — Therapy (Signed)
[place], [place], [address] Phone: [phone]   Fax:  [phone]  Physical Therapy Treatment  Patient Details  Name: Todd George MRN: [id number] Date of Birth: 11/02/1941 Referring Provider (PT): Dr. 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   BPH (benign prostatic hyperplasia)    Cancer ([place])    skin cancer   Carotid artery disease ([place])    Cellulitis    Chronic back pain    Colon polyps 01/04/2016   COPD (chronic obstructive pulmonary disease) ([place])    2ppd   Diabetes mellitus without complication ([place])    Duodenal erosion 01/02/2016   Gastric erosion with bleeding 01/02/2016   Hyperlipidemia    Hypertension    OSA on CPAP    not used machine in over 10 yrs    Past Surgical History:  Procedure Laterality Date   CHOLECYSTECTOMY     COLONOSCOPY N/A 01/03/2016   RMR: Active bleeding seen arising from the small bowel. Multiple cecal/ascending colon AVMs ablated. 1.5 cm carpet polyp in the ascending segment status post piecemeal snare polypectomy and tattooing. Innocent appearing colonic diverticulosis.   COLONOSCOPY N/A 06/14/2016   Procedure: COLONOSCOPY;  Surgeon: Daneil Dolin, MD;  [facility];  Service: Endoscopy;  Laterality: N/A;  1200   COLONOSCOPY WITH PROPOFOL N/A 11/28/2018   Procedure: COLONOSCOPY WITH PROPOFOL;  Surgeon: Daneil Dolin, MD;  [facility];  Service: Endoscopy;  Laterality: N/A;  8:30am   ESOPHAGOGASTRODUODENOSCOPY N/A 01/01/2016   RMR: Multiple gastric and duodenal erosions likely NSAID related.   GIVENS CAPSULE STUDY N/A 01/03/2016   Incomplete study, capsule never reach cecum. Scattered erosions and AVMs throughout the small bowel.   JOINT REPLACEMENT     POLYPECTOMY  11/28/2018   Procedure: POLYPECTOMY;  Surgeon: Daneil Dolin, MD;  [facility];  Service: Endoscopy;;  ascending colon   REPLACEMENT TOTAL KNEE Left    SHOULDER ARTHROSCOPY WITH DISTAL CLAVICLE RESECTION Right 05/28/2015   Procedure: SHOULDER ARTHROSCOPY WITH DISTAL CLAVICLE RESECTION;  Surgeon: Melrose Nakayama, MD;  Location: [place];  Service: Orthopedics;  Laterality: Right;   SHOULDER ARTHROSCOPY WITH SUBACROMIAL DECOMPRESSION Right 05/28/2015   Procedure: SHOULDER ARTHROSCOPY WITH SUBACROMIAL DECOMPRESSION;  Surgeon: Melrose Nakayama, MD;  Location: [place];  Service: Orthopedics;  Laterality: Right;   THROAT SURGERY  1965   TOTAL KNEE ARTHROPLASTY Right 09/06/2021   Procedure: TOTAL KNEE ARTHROPLASTY;  Surgeon: Paralee Cancel, MD;  [facility];  Service: Orthopedics;  Laterality: Right;    There were no vitals filed for this visit.   Subjective Assessment - 09/19/21 1440     Subjective "Did alot of walking over the weekend"    Currently in Pain? Yes  Pain Score 3     Pain Location Knee    Pain Orientation Right    Pain Descriptors / Indicators Sore    Pain Type Surgical pain                [facility] PT Assessment - 09/19/21 0001       Assessment   Medical Diagnosis Right TKR                           [facility] Adult PT Treatment/Exercise - 09/19/21 0001       Knee/Hip Exercises: Stretches   Knee: Self-Stretch to increase Flexion Right    Knee: Self-Stretch Limitations 2x10 3 sec hold    Gastroc Stretch Right;2 reps;60 seconds      Knee/Hip Exercises: Standing    Gait Training backwards walking 2x20 ft      Knee/Hip Exercises: Seated   Long Arc Quad AROM;Right;3 sets;10 reps    Heel Slides AAROM;Right    Heel Slides Limitations 2 min    Sit to Sand 2 sets;10 reps;with UE support      Knee/Hip Exercises: Supine   Quad Sets Strengthening;Right;3 sets;10 reps                       PT Short Term Goals - 09/12/21 1519       PT SHORT TERM GOAL #1   Title Patient will be independent with HEP in order to improve functional outcomes.    Time 3    Period Weeks    Status New    Target Date 10/03/21      PT SHORT TERM GOAL #2   Title Demonstrate right knee ROM to full extension, 100 degrees flexion to enable normalized kinematics    Baseline lacking 21 degree extension, 82 flexion    Time 3    Period Weeks    Status New    Target Date 10/03/21               PT Long Term Goals - 09/12/21 1520       PT LONG TERM GOAL #1   Title Patient will improve FOTO score by at least 10 points in order to indicate improved tolerance to activity.    Baseline 28% function    Time 6    Period Weeks    Status New    Target Date 10/24/21      PT LONG TERM GOAL #2   Title Patient will be able to ambulate at least 250 feet in 2MWT in order to demonstrate improved gait speed for community ambulation.    Baseline 100 ft with RW    Time 6    Period Weeks    Status New    Target Date 10/24/21                   Plan - 09/19/21 1509     Clinical Impression Statement Demo increased right knee flexion to 110 degrees. Lacking terminal right knee extension 7-10 degrees limiting symmetry with sit to stand. Poorly tolerates extension-biased exercises/stretching for right knee reqruiing frequent breaks and decrease time for static stretching about 30 seconds. Continued sessions indicated to progress right knee ROM/strength and progress to ambulation cane    Personal Factors and Comorbidities Age;Comorbidity 1;Time since onset of  injury/illness/exacerbation    Comorbidities Tbco abuse    Examination-Activity Limitations Bed Mobility;Carry;Lift;Toileting;Stand;Stairs;Squat;Reach Overhead;Transfers;Locomotion Level    Examination-Participation Restrictions  Cleaning;Community Activity;Laundry;Yard Work;Shop;Meal Prep    Stability/Clinical Decision Making Stable/Uncomplicated    Rehab Potential Good    PT Frequency 3x / week    PT Duration 6 weeks    PT Treatment/Interventions ADLs/Self Care Home Management;Gait training;Stair training;Functional mobility training;Therapeutic activities;Therapeutic exercise;Balance training;Patient/family education;Neuromuscular re-education;Manual techniques;Passive range of motion;Dry needling;Spinal Manipulations;Joint Manipulations    PT Next Visit Plan Continue with TKR rehab    PT Home Exercise Plan QS, heel slides, heel propr for extensoin    Consulted and Agree with Plan of Care Patient             Patient will benefit from skilled therapeutic intervention in order to improve the following deficits and impairments:  Abnormal gait, Decreased activity tolerance, Decreased mobility, Decreased range of motion, Decreased strength, Difficulty walking, Increased edema, Pain  Visit Diagnosis: Difficulty in walking, not elsewhere classified  Muscle weakness (generalized)  Other abnormalities of gait and mobility  Other symptoms and signs involving the musculoskeletal system     Problem List Patient Active Problem List   Diagnosis Date Noted   S/P total knee arthroplasty, 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   Diverticulosis of colon without hemorrhage    Solitary pulmonary nodule 02/10/2016   Abnormal CT scan 02/02/2016   Chronic GI bleeding 02/01/2016   Anemia due to chronic blood loss 02/01/2016   Colon polyps 01/04/2016   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

## 2021-09-21 ENCOUNTER — Ambulatory Visit (HOSPITAL_COMMUNITY): Payer: Medicare HMO

## 2021-09-21 ENCOUNTER — Other Ambulatory Visit: Payer: Self-pay

## 2021-09-21 DIAGNOSIS — M6281 Muscle weakness (generalized): Secondary | ICD-10-CM

## 2021-09-21 DIAGNOSIS — R2689 Other abnormalities of gait and mobility: Secondary | ICD-10-CM

## 2021-09-21 DIAGNOSIS — R29898 Other symptoms and signs involving the musculoskeletal system: Secondary | ICD-10-CM | POA: Diagnosis not present

## 2021-09-21 DIAGNOSIS — R262 Difficulty in walking, not elsewhere classified: Secondary | ICD-10-CM | POA: Diagnosis not present

## 2021-09-21 NOTE — Therapy (Signed)
[place], [place], [address] Phone: [phone]   Fax:  [phone]  Physical Therapy Treatment  Patient Details  Name: Todd George MRN: [id number] Date of Birth: 04-27-1941 Referring Provider (PT): Dr. 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   BPH (benign prostatic hyperplasia)    Cancer ([place])    skin cancer   Carotid artery disease ([place])    Cellulitis    Chronic back pain    Colon polyps 01/04/2016   COPD (chronic obstructive pulmonary disease) ([place])    2ppd   Diabetes mellitus without complication ([place])    Duodenal erosion 01/02/2016   Gastric erosion with bleeding 01/02/2016   Hyperlipidemia    Hypertension    OSA on CPAP    not used machine in over 10 yrs    Past Surgical History:  Procedure Laterality Date   CHOLECYSTECTOMY     COLONOSCOPY N/A 01/03/2016   RMR: Active bleeding seen arising from the small bowel. Multiple cecal/ascending colon AVMs ablated. 1.5 cm carpet polyp in the ascending segment status post piecemeal snare polypectomy and tattooing. Innocent appearing colonic diverticulosis.   COLONOSCOPY N/A 06/14/2016   Procedure: COLONOSCOPY;  Surgeon: Daneil Dolin, MD;  [facility];  Service: Endoscopy;  Laterality: N/A;  1200   COLONOSCOPY WITH PROPOFOL N/A 11/28/2018   Procedure: COLONOSCOPY WITH PROPOFOL;  Surgeon: Daneil Dolin, MD;  [facility];  Service: Endoscopy;  Laterality: N/A;  8:30am   ESOPHAGOGASTRODUODENOSCOPY N/A 01/01/2016   RMR: Multiple gastric and duodenal erosions likely NSAID related.   GIVENS CAPSULE STUDY N/A 01/03/2016   Incomplete study, capsule never reach cecum. Scattered erosions and AVMs throughout the small bowel.   JOINT REPLACEMENT     POLYPECTOMY  11/28/2018   Procedure: POLYPECTOMY;  Surgeon: Daneil Dolin, MD;  [facility];  Service: Endoscopy;;  ascending colon   REPLACEMENT TOTAL KNEE Left    SHOULDER ARTHROSCOPY WITH DISTAL CLAVICLE RESECTION Right 05/28/2015   Procedure: SHOULDER ARTHROSCOPY WITH DISTAL CLAVICLE RESECTION;  Surgeon: Melrose Nakayama, MD;  Location: [place];  Service: Orthopedics;  Laterality: Right;   SHOULDER ARTHROSCOPY WITH SUBACROMIAL DECOMPRESSION Right 05/28/2015   Procedure: SHOULDER ARTHROSCOPY WITH SUBACROMIAL DECOMPRESSION;  Surgeon: Melrose Nakayama, MD;  Location: [place];  Service: Orthopedics;  Laterality: Right;   THROAT SURGERY  1965   TOTAL KNEE ARTHROPLASTY Right 09/06/2021   Procedure: TOTAL KNEE ARTHROPLASTY;  Surgeon: Paralee Cancel, MD;  Location: [facility];  Service: Orthopedics;  Laterality: Right;    There were no vitals filed for this visit.   Subjective Assessment - 09/21/21 1441     Subjective "Able to walk with less support from RW"    Currently in Pain?  Yes    Pain Score 3     Pain Location Knee    Pain Orientation Right                [facility] PT Assessment - 09/21/21 0001       Assessment   Medical Diagnosis Right TKR    Referring Provider (PT) Dr. Paralee Cancel    Onset Date/Surgical Date 09/06/21                           [place] [facility] PT Treatment/Exercise - 09/21/21 0001       Ambulation/Gait   Ambulation/Gait Yes    Ambulation/Gait Assistance 5: Supervision    Assistive device Straight cane    Gait Comments gait training on level surfaces with SBA-CGA to improve  safety/sequence with cane for ambulation      Knee/Hip Exercises: Aerobic   Nustep level 1 x 5 min for AROM      Knee/Hip Exercises: Machines for Strengthening   Cybex Knee Flexion 4 plates 3x10      Knee/Hip Exercises: Supine   Quad Sets Strengthening;Right;3 sets;10 reps    Short Arc [employer] Strengthening;Right;3 sets;10 reps    Short Arc Quad Sets Limitations 0    Heel Slides AAROM;Right;2 sets;10 reps    Knee Extension AROM    Knee Extension Limitations 10   lacking   Knee Flexion AROM;Right    Knee Flexion Limitations 109                       PT Short Term Goals - 09/12/21 1519       PT SHORT TERM GOAL #1   Title Patient will be independent with HEP in order to improve functional outcomes.    Time 3    Period Weeks    Status New    Target Date 10/03/21      PT SHORT TERM GOAL #2   Title Demonstrate right knee ROM to full extension, 100 degrees flexion to enable normalized kinematics    Baseline lacking 21 degree extension, 82 flexion    Time 3    Period Weeks    Status New    Target Date 10/03/21               PT Long Term Goals - 09/12/21 1520       PT LONG TERM GOAL #1   Title Patient will improve FOTO score by at least 10 points in order to indicate improved tolerance to activity.    Baseline 28% function    Time 6    Period Weeks    Status New    Target Date 10/24/21      PT LONG TERM GOAL #2   Title Patient will be able to ambulate at least 250 feet in 2MWT in order to demonstrate improved gait speed for community ambulation.    Baseline 100 ft with RW    Time 6    Period Weeks    Status New    Target Date 10/24/21                   Plan - 09/21/21 1506     Clinical Impression Statement Progressing with ROM and strength activities for right knee. Frequent rest periods needed due to weakness, pain, and reduced activity tolerance. Attempted SAQ with weight but unable to complete motion due to pain/weakness.   Continued sessoins indicated to  improve right knee ROM, strength, and progress ambulation to use of cane    Personal Factors and Comorbidities Age;Comorbidity 1;Time since onset of injury/illness/exacerbation    Comorbidities Tbco abuse    Examination-Activity Limitations Bed Mobility;Carry;Lift;Toileting;Stand;Stairs;Squat;Reach Overhead;Transfers;Locomotion Level    Examination-Participation Restrictions Cleaning;Community Activity;Laundry;Yard Work;Shop;Meal Prep    Stability/Clinical Decision Making Stable/Uncomplicated    Rehab Potential Good    PT Frequency 3x / week    PT Duration 6 weeks    PT Treatment/Interventions ADLs/Self Care Home Management;Gait training;Stair training;Functional mobility training;Therapeutic activities;Therapeutic exercise;Balance training;Patient/family education;Neuromuscular re-education;Manual techniques;Passive range of motion;Dry needling;Spinal Manipulations;Joint Manipulations    PT Next Visit Plan Continue with TKR rehab    PT Home Exercise Plan QS, heel slides, heel propr for extensoin    Consulted and Agree with Plan of Care Patient             Patient will benefit from skilled therapeutic intervention in order to improve the following deficits and impairments:  Abnormal gait, Decreased activity tolerance, Decreased mobility, Decreased range of motion, Decreased strength, Difficulty walking, Increased edema, Pain  Visit Diagnosis: Difficulty in walking, not elsewhere classified  Muscle weakness (generalized)  Other abnormalities of gait and mobility  Other symptoms and signs involving the musculoskeletal system     Problem List Patient Active Problem List   Diagnosis Date Noted   S/P total knee arthroplasty, 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   Diverticulosis of colon without hemorrhage    Solitary pulmonary nodule 02/10/2016   Abnormal CT scan 02/02/2016   Chronic GI bleeding 02/01/2016   Anemia due to chronic blood loss 02/01/2016   Colon polyps  01/04/2016   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   Gastric erosion with bleeding 01/02/2016   Duodenal erosion 01/02/2016   Tobacco abuse 01/01/2016   Essential hypertension 01/01/2016   Mucosal abnormality of stomach    GI bleed 12/31/2015   Diabetes mellitus without complication ([facility])    Hyperlipidemia    BPH (benign prostatic hyperplasia)    Carotid artery disease ([facility])    OSA on CPAP    COPD (chronic obstructive pulmonary disease) ([place])     Toniann Fail, PT 09/21/2021, 3:09 PM  [facility] [address] [place], [place], [address] Phone: [phone]   Fax:  [phone]  Name: Todd George MRN: [id number] Date of Birth: April 16, 1941

## 2021-09-21 NOTE — Discharge Summary (Signed)
Physician Discharge Summary   Patient ID: Todd George MRN: [id number] DOB/AGE: 02/07/1941 80 y.o.  Admit date: 09/06/2021 Discharge date: 09/07/2021  Primary Diagnosis: Right knee osteoarthritis.  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   BPH (benign prostatic hyperplasia)    Cancer (HCC)    skin cancer   Carotid artery disease (HCC)    Cellulitis    Chronic back pain    Colon polyps 01/04/2016   COPD (chronic obstructive pulmonary disease) ([place])    2ppd   Diabetes mellitus without complication ([place])    Duodenal erosion 01/02/2016   Gastric erosion with bleeding 01/02/2016   Hyperlipidemia    Hypertension    OSA on CPAP    not used machine in over 10 yrs   Discharge Diagnoses:   Active Problems:   S/P total knee arthroplasty, right  Estimated body mass index is 31.01 kg/m as calculated from the following:   Height as of this encounter: 5\' 7"  (1.702 m).   Weight as of this encounter: 89.8 kg.  Procedure:  Procedure(s) (LRB): TOTAL KNEE ARTHROPLASTY (Right)   Consults: None  HPI: Todd George is a 80 y.o. male patient of   mine.  The patient had been seen, evaluated, and treated for months conservatively in the   office with medication, activity modification, and injections.  The patient had   radiographic changes of bone-on-bone arthritis with endplate sclerosis and osteophytes noted.  Based on the radiographic changes and failed conservative measures, the patient   decided to proceed with definitive treatment, total knee replacement.  Risks of infection, DVT, component failure, need for revision surgery, neurovascular injury were reviewed in the office setting.  The postop course was reviewed stressing the efforts to maximize post-operative satisfaction and function.  Consent was obtained for benefit of pain   relief.   Laboratory Data: Admission on 09/06/2021,  Discharged on 09/07/2021  Component Date Value Ref Range Status   Glucose-Capillary 09/06/2021 136 (A) 70 - 99 mg/dL Final   Glucose reference range applies only to samples taken after fasting for at least 8 hours.   WBC 09/07/2021 15.0 (A) 4.0 - 10.5 K/uL Final   RBC 09/07/2021 4.15 (A) 4.22 - 5.81 MIL/uL Final   Hemoglobin 09/07/2021 12.6 (A) 13.0 - 17.0 g/dL Final   HCT 09/07/2021 37.9 (A) 39.0 - 52.0 % Final   MCV 09/07/2021 91.3  80.0 - 100.0 fL Final   MCH 09/07/2021 30.4  26.0 - 34.0 pg Final   MCHC 09/07/2021 33.2  30.0 - 36.0 g/dL Final   RDW 09/07/2021 14.0  11.5 - 15.5 % Final   Platelets 09/07/2021 190  150 - 400 K/uL Final   nRBC 09/07/2021 0.0  0.0 - 0.2 % Final   Performed at [facility], [place] [address], [place], [place] [address]   Sodium 09/07/2021 139  135 - 145 mmol/L Final   Potassium 09/07/2021 5.1  3.5 - 5.1 mmol/L Final   Chloride 09/07/2021 102  98 - 111 mmol/L Final   CO2 09/07/2021 29  22 - 32 mmol/L Final   Glucose, Bld 09/07/2021 138 (A) 70 - 99 mg/dL Final   Glucose reference range applies only to samples taken after fasting for at least 8 hours.   BUN 09/07/2021 42 (A) 8 - 23 mg/dL Final   Creatinine, Ser 09/07/2021 1.62 (A) 0.61 - 1.24 mg/dL Final   Calcium  09/07/2021 10.0  8.9 - 10.3 mg/dL Final   GFR, Estimated 09/07/2021 42 (A) >60 mL/min Final   Comment: (NOTE) Calculated using the CKD-EPI Creatinine Equation (2021)    Anion gap 09/07/2021 8  5 - 15 Final   Performed at [facility], [place] [address] [place], [place] [address]  Orders Only on 09/02/2021  Component Date Value Ref Range Status   SARS Coronavirus 2 09/02/2021 RESULT: NEGATIVE   Final   Comment: RESULT: NEGATIVESARS-CoV-2 INTERPRETATION:A NEGATIVE  test result means that SARS-CoV-2 RNA was not present in the specimen above the limit of detection of this test. This does not preclude a possible SARS-CoV-2 infection and should not be used as the  sole  basis for patient management decisions. Negative results must be combined with clinical observations, patient history, and epidemiological information. Optimum specimen types and timing for peak viral levels during infections caused by SARS-CoV-2  have not been determined. Collection of multiple specimens or types of specimens may be necessary to detect virus. Improper specimen collection and handling, sequence variability under primers/probes, or organism present below the limit of detection may  lead to false negative results. Positive and negative predictive values of testing are highly dependent on prevalence. False negative test results are more likely when prevalence of disease is high.The expected result is NEGATIVE.Fact S                          heet for  Healthcare Providers: LocalChronicle.no Sheet for Patients: SalonLookup.es Reference Range - Negative   Hospital Outpatient Visit on 09/02/2021  Component Date Value Ref Range Status   Glucose-Capillary 09/02/2021 111 (A) 70 - 99 mg/dL Final   Glucose reference range applies only to samples taken after fasting for at least 8 hours.   WBC 09/02/2021 8.8  4.0 - 10.5 K/uL Final   RBC 09/02/2021 4.75  4.22 - 5.81 MIL/uL Final   Hemoglobin 09/02/2021 14.4  13.0 - 17.0 g/dL Final   HCT 09/02/2021 43.9  39.0 - 52.0 % Final   MCV 09/02/2021 92.4  80.0 - 100.0 fL Final   MCH 09/02/2021 30.3  26.0 - 34.0 pg Final   MCHC 09/02/2021 32.8  30.0 - 36.0 g/dL Final   RDW 09/02/2021 14.0  11.5 - 15.5 % Final   Platelets 09/02/2021 218  150 - 400 K/uL Final   nRBC 09/02/2021 0.0  0.0 - 0.2 % Final   Performed at [facility], [place] [address], [place], [place] [address]   Sodium 09/02/2021 134 (A) 135 - 145 mmol/L Final   Potassium 09/02/2021 3.8  3.5 - 5.1 mmol/L Final   Chloride 09/02/2021 97 (A) 98 - 111 mmol/L Final   CO2 09/02/2021 29  22 - 32 mmol/L Final   Glucose,  Bld 09/02/2021 102 (A) 70 - 99 mg/dL Final   Glucose reference range applies only to samples taken after fasting for at least 8 hours.   BUN 09/02/2021 29 (A) 8 - 23 mg/dL Final   Creatinine, Ser 09/02/2021 1.24  0.61 - 1.24 mg/dL Final   Calcium 09/02/2021 10.0  8.9 - 10.3 mg/dL Final   Total Protein 09/02/2021 6.5  6.5 - 8.1 g/dL Final   Albumin 09/02/2021 4.3  3.5 - 5.0 g/dL Final   AST 09/02/2021 49 (A) 15 - 41 U/L Final   ALT 09/02/2021 20  0 - 44 U/L Final   Alkaline Phosphatase 09/02/2021 57  38 - 126 U/L  Final   Total Bilirubin 09/02/2021 1.0  0.3 - 1.2 mg/dL Final   GFR, Estimated 09/02/2021 58 (A) >60 mL/min Final   Comment: (NOTE) Calculated using the CKD-EPI Creatinine Equation (2021)    Anion gap 09/02/2021 8  5 - 15 Final   Performed at [facility], [place] [address], [place], [place] [address]   ABO/RH(D) 09/02/2021 B POS   Final   Antibody Screen 09/02/2021 NEG   Final   Sample Expiration 09/02/2021 09/09/2021,2359   Final   Extend sample reason 09/02/2021    Final                   Value:NO TRANSFUSIONS OR PREGNANCY IN THE PAST 3 MONTHS Performed at [place] [address] [place], [place] [address]    MRSA, PCR 09/02/2021 NEGATIVE  NEGATIVE Final   Staphylococcus aureus 09/02/2021 NEGATIVE  NEGATIVE Final   Comment: (NOTE) The Xpert SA Assay (FDA approved for NASAL specimens in patients 19 years of age and older), is one component of a comprehensive surveillance program. It is not intended to diagnose infection nor to guide or monitor treatment. Performed at [facility], [place] [address] [place], [place] [address]   Hospital Outpatient Visit on 08/26/2021  Component Date Value Ref Range Status   AR max vel 08/26/2021 1.32  cm2 Final   AV Area VTI 08/26/2021 1.49  cm2 Final   AV Mean grad 08/26/2021 8.0  mmHg Final   AV Peak grad 08/26/2021 16.5  mmHg Final   Ao pk vel 08/26/2021 2.03  m/s Final    AV Area mean vel 08/26/2021 1.49  cm2 Final   Area-P 1/2 08/26/2021 3.79  cm2 Final   S' Lateral 08/26/2021 2.10  cm Final     X-Rays:ECHOCARDIOGRAM COMPLETE  Result Date: 08/26/2021    ECHOCARDIOGRAM REPORT   Patient Name:   LAMONTE HARTT Date of Exam: 08/26/2021 Medical Rec #:  [id number]       Height:       70.0 in Accession #:    [id number]      Weight:       201.0 lb Date of Birth:  03/06/1941       BSA:          2.092 m Patient Age:    16 years        BP:           175/74 mmHg Patient Gender: M               HR:           65 bpm. Exam Location:  [facility] Procedure: 2D Echo, Cardiac Doppler and Color Doppler Indications:    Z01.810 (ICD-10-CM) - Pre-operative cardiovascular  History:        Patient has no prior history of Echocardiogram examinations.                 COPD, Signs/Symptoms:Murmur; Risk Factors:Hypertension, Diabetes                 and Dyslipidemia. OSA on CPAP, Tobacco abuse, Cancer (HCC) (From                 Hx).  Sonographer:    Alvino Chapel RCS Referring Phys: [id number] [place]  1. Left ventricular ejection fraction, by estimation, is 60 to 65%. The left ventricle has normal function. The left ventricle has no regional wall motion abnormalities. There  is moderate asymmetric left ventricular hypertrophy of the septal segment. Left ventricular diastolic parameters are consistent with Grade I diastolic dysfunction (impaired relaxation).  2. Right ventricular systolic function is normal. The right ventricular size is normal.  3. Left atrial size was mildly dilated.  4. Right atrial size was upper normal.  5. The mitral valve is degenerative. Trivial mitral valve regurgitation.  6. The aortic valve is tricuspid with severely reduced cusp excursion. There is severe calcifcation of the aortic valve. Aortic valve regurgitation is not visualized. Moderate aortic valve stenosis with paradoxically low gradient. Aortic valve mean gradient measures 8.0 mmHg. Dimentionless index  0.59.  7. Aortic calcification and atheroma noted proximally.  8. The inferior vena cava is normal in size with greater than 50% respiratory variability, suggesting right atrial pressure of 3 mmHg. Comparison(s): No prior Echocardiogram. FINDINGS  Left Ventricle: Left ventricular ejection fraction, by estimation, is 60 to 65%. The left ventricle has normal function. The left ventricle has no regional wall motion abnormalities. The left ventricular internal cavity size was normal in size. There is  moderate asymmetric left ventricular hypertrophy of the septal segment. Left ventricular diastolic parameters are consistent with Grade I diastolic dysfunction (impaired relaxation). Right Ventricle: The right ventricular size is normal. No increase in right ventricular wall thickness. Right ventricular systolic function is normal. Left Atrium: Left atrial size was mildly dilated. Right Atrium: Right atrial size was upper normal. Pericardium: There is no evidence of pericardial effusion. Mitral Valve: The mitral valve is degenerative in appearance. There is mild thickening of the mitral valve leaflet(s). There is mild calcification of the mitral valve leaflet(s). Mild mitral annular calcification. Trivial mitral valve regurgitation. Tricuspid Valve: The tricuspid valve is grossly normal. Tricuspid valve regurgitation is trivial. Aortic Valve: The aortic valve is tricuspid. There is severe calcifcation of the aortic valve. There is mild aortic valve annular calcification. Aortic valve regurgitation is not visualized. Moderate aortic stenosis is present. Aortic valve mean gradient  measures 8.0 mmHg. Aortic valve peak gradient measures 16.5 mmHg. Aortic valve area, by VTI measures 1.49 cm. Pulmonic Valve: The pulmonic valve was grossly normal. Pulmonic valve regurgitation is trivial. Aorta: The aortic root is normal in size and structure and calcification and atheroma noted proximally. Venous: The inferior vena cava is  normal in size with greater than 50% respiratory variability, suggesting right atrial pressure of 3 mmHg. IAS/Shunts: No atrial level shunt detected by color flow Doppler.  LEFT VENTRICLE PLAX 2D LVIDd:         3.80 cm  Diastology LVIDs:         2.10 cm  LV e' medial:    6.31 cm/s LV PW:         1.10 cm  LV E/e' medial:  13.6 LV IVS:        1.40 cm  LV e' lateral:   7.62 cm/s LVOT diam:     1.80 cm  LV E/e' lateral: 11.3 LV SV:         85 LV SV Index:   41 LVOT Area:     2.54 cm  RIGHT VENTRICLE RV S prime:     11.70 cm/s TAPSE (M-mode): 2.5 cm LEFT ATRIUM             Index       RIGHT ATRIUM           Index LA diam:        3.80 cm 1.82 cm/m  RA Area:  23.70 cm LA Vol (A2C):   92.1 ml 44.03 ml/m RA Volume:   65.30 ml  31.22 ml/m LA Vol (A4C):   67.6 ml 32.32 ml/m LA Biplane Vol: 81.9 ml 39.15 ml/m  AORTIC VALVE AV Area (Vmax):    1.32 cm AV Area (Vmean):   1.49 cm AV Area (VTI):     1.49 cm AV Vmax:           203.00 cm/s AV Vmean:          129.667 cm/s AV VTI:            0.571 m AV Peak Grad:      16.5 mmHg AV Mean Grad:      8.0 mmHg LVOT Vmax:         105.00 cm/s LVOT Vmean:        75.700 cm/s LVOT VTI:          0.335 m LVOT/AV VTI ratio: 0.59  AORTA Ao Root diam: 3.10 cm MITRAL VALVE MV Area (PHT): 3.79 cm     SHUNTS MV Decel Time: 200 msec     Systemic VTI:  0.34 m MV E velocity: 86.10 cm/s   Systemic Diam: 1.80 cm MV A velocity: 103.00 cm/s MV E/A ratio:  0.84 Rozann Lesches MD Electronically signed by Rozann Lesches MD Signature Date/Time: 08/26/2021/11:11:01 AM    Final     EKG: Orders placed or performed in visit on 08/24/21   EKG 12-Lead     Hospital Course: Todd George is a 80 y.o. who was admitted to [facility]. They were brought to the operating room on 09/06/2021 and underwent Procedure(s): TOTAL KNEE ARTHROPLASTY.  Patient tolerated the procedure well and was later transferred to the recovery room and then to the orthopaedic floor for postoperative care. They were  given PO and IV analgesics for pain control following their surgery. They were given 24 hours of postoperative antibiotics of  Anti-infectives (From admission, onward)    Start     Dose/Rate Route Frequency Ordered Stop   09/06/21 1700  ceFAZolin (ANCEF) IVPB 2g/100 mL premix        2 g 200 mL/hr over 30 Minutes Intravenous Every 6 hours 09/06/21 1627 09/06/21 2239   09/06/21 0830  ceFAZolin (ANCEF) IVPB 2g/100 mL premix        2 g 200 mL/hr over 30 Minutes Intravenous On call to O.R. 09/06/21 [id number] 09/06/21 1040      and started on DVT prophylaxis in the form of Aspirin.   PT and OT were ordered for total joint protocol. Discharge planning consulted to help with postop disposition and equipment needs.  Patient had a good night on the evening of surgery. They started to get up OOB with therapy on POD #0. Pt was seen during rounds and was ready to go home pending progress with therapy.He worked with therapy on POD #1 and was meeting his goals. Pt was discharged to home later that day in stable condition.  Diet: Regular diet Activity: WBAT Follow-up: in 2 weeks Disposition: Home Discharged Condition: good   Discharge Instructions     Call MD / Call 911   Complete by: As directed    If you experience chest pain or shortness of breath, CALL 911 and be transported to the hospital emergency room.  If you develope a fever above 101 F, pus (white drainage) or increased drainage or redness at the wound, or calf pain, call your surgeon's office.  Change dressing   Complete by: As directed    Maintain surgical dressing until follow up in the clinic. If the edges start to pull up, may reinforce with tape. If the dressing is no longer working, may remove and cover with gauze and tape, but must keep the area dry and clean.  Call with any questions or concerns.   Constipation Prevention   Complete by: As directed    Drink plenty of fluids.  Prune juice may be helpful.  You may use a stool softener,  such as Colace (over the counter) 100 mg twice a day.  Use MiraLax (over the counter) for constipation as needed.   Diet - low sodium heart healthy   Complete by: As directed    Increase activity slowly as tolerated   Complete by: As directed    Weight bearing as tolerated with assist device (walker, cane, etc) as directed, use it as long as suggested by your surgeon or therapist, typically at least 4-6 weeks.   Post-operative opioid taper instructions:   Complete by: As directed    POST-OPERATIVE OPIOID TAPER INSTRUCTIONS: It is important to wean off of your opioid medication as soon as possible. If you do not need pain medication after your surgery it is ok to stop day one. Opioids include: Codeine, Hydrocodone(Norco, Vicodin), Oxycodone(Percocet, oxycontin) and hydromorphone amongst others.  Long term and even short term use of opiods can cause: Increased pain response Dependence Constipation Depression Respiratory depression And more.  Withdrawal symptoms can include Flu like symptoms Nausea, vomiting And more Techniques to manage these symptoms Hydrate well Eat regular healthy meals Stay active Use relaxation techniques(deep breathing, meditating, yoga) Do Not substitute Alcohol to help with tapering If you have been on opioids for less than two weeks and do not have pain than it is ok to stop all together.  Plan to wean off of opioids This plan should start within one week post op of your joint replacement. Maintain the same interval or time between taking each dose and first decrease the dose.  Cut the total daily intake of opioids by one tablet each day Next start to increase the time between doses. The last dose that should be eliminated is the evening dose.      TED hose   Complete by: As directed    Use stockings (TED hose) for 2 weeks on both leg(s).  You may remove them at night for sleeping.      Allergies as of 09/07/2021       Reactions   Nsaids Other  (See Comments)   Recurrent GI bleed        Medication List     STOP taking these medications    acetaminophen 500 MG tablet Commonly known as: TYLENOL   traMADol 50 MG tablet Commonly known as: ULTRAM       TAKE these medications    calcium-vitamin D 500-200 MG-UNIT tablet Commonly known as: OSCAL WITH D Take 1 tablet by mouth daily with breakfast.   clotrimazole-betamethasone cream Commonly known as: LOTRISONE Apply 1 application topically daily as needed (for jock itch).   docusate sodium 100 MG capsule Commonly known as: COLACE Take 1 capsule (100 mg total) by mouth 2 (two) times daily.   ferrous sulfate 325 (65 FE) MG tablet Take 325 mg by mouth every 7 (seven) days.   gabapentin 100 MG capsule Commonly known as: NEURONTIN Take 100 mg by mouth 2 (two) times daily.   HYDROcodone-acetaminophen 5-325 MG  tablet Commonly known as: NORCO/VICODIN Take 1-2 tablets by mouth every 4 (four) hours as needed for severe pain.   hydrOXYzine 25 MG tablet Commonly known as: ATARAX/VISTARIL Take 50 mg by mouth every 6 (six) hours as needed for itching.   lisinopril-hydrochlorothiazide 20-12.5 MG tablet Commonly known as: ZESTORETIC Take 1 tablet by mouth daily.   losartan 50 MG tablet Commonly known as: COZAAR Take 50 mg by mouth daily.   methocarbamol 500 MG tablet Commonly known as: ROBAXIN Take 1 tablet (500 mg total) by mouth every 6 (six) hours as needed for muscle spasms.   metoprolol succinate 25 MG 24 hr tablet Commonly known as: TOPROL-XL Take 25 mg by mouth daily.   oxybutynin 5 MG tablet Commonly known as: DITROPAN Take 5 mg by mouth daily.   polyethylene glycol 17 g packet Commonly known as: MIRALAX / GLYCOLAX Take 17 g by mouth daily as needed for mild constipation.   PreserVision AREDS 2 Caps Take 1 capsule by mouth 2 (two) times daily.   PSYLLIUM PO Take 2 Scoops by mouth daily.   rivaroxaban 10 MG Tabs tablet Commonly known as:  XARELTO Take 1 tablet (10 mg total) by mouth daily with breakfast for 14 days.   rOPINIRole 0.5 MG tablet Commonly known as: REQUIP Take 0.5 mg by mouth 2 (two) times daily as needed (restless leg).   simvastatin 20 MG tablet Commonly known as: ZOCOR Take 20 mg by mouth at bedtime.   tamsulosin 0.4 MG Caps capsule Commonly known as: FLOMAX Take 0.4 mg by mouth daily.               Discharge Care Instructions  (From admission, onward)           Start     Ordered   09/07/21 0000  Change dressing       Comments: Maintain surgical dressing until follow up in the clinic. If the edges start to pull up, may reinforce with tape. If the dressing is no longer working, may remove and cover with gauze and tape, but must keep the area dry and clean.  Call with any questions or concerns.   09/07/21 0740            Follow-up Information     Paralee Cancel, MD. Go on 09/30/2021.   Specialty: Orthopedic Surgery Why: You are scheduled for a follow up appointment on 09-30-21 at 11:00 am. Contact information: [address] [place] [address] [phone]                 Signed: Griffith Citron, PA-C Orthopedic Surgery 09/21/2021, 12:26 PM

## 2021-09-22 DIAGNOSIS — H353231 Exudative age-related macular degeneration, bilateral, with active choroidal neovascularization: Secondary | ICD-10-CM | POA: Diagnosis not present

## 2021-09-23 ENCOUNTER — Ambulatory Visit (HOSPITAL_COMMUNITY): Payer: Medicare HMO

## 2021-09-23 ENCOUNTER — Other Ambulatory Visit: Payer: Self-pay

## 2021-09-23 DIAGNOSIS — R262 Difficulty in walking, not elsewhere classified: Secondary | ICD-10-CM

## 2021-09-23 DIAGNOSIS — R29898 Other symptoms and signs involving the musculoskeletal system: Secondary | ICD-10-CM | POA: Diagnosis not present

## 2021-09-23 DIAGNOSIS — E1165 Type 2 diabetes mellitus with hyperglycemia: Secondary | ICD-10-CM | POA: Diagnosis not present

## 2021-09-23 DIAGNOSIS — I10 Essential (primary) hypertension: Secondary | ICD-10-CM | POA: Diagnosis not present

## 2021-09-23 DIAGNOSIS — M6281 Muscle weakness (generalized): Secondary | ICD-10-CM | POA: Diagnosis not present

## 2021-09-23 DIAGNOSIS — R2689 Other abnormalities of gait and mobility: Secondary | ICD-10-CM | POA: Diagnosis not present

## 2021-09-23 NOTE — Therapy (Signed)
[place], [place], [address] Phone: [phone]   Fax:  [phone]  Physical Therapy Treatment  Patient Details  Name: Todd George MRN: [id number] Date of Birth: 09/26/1941 Referring Provider (PT): Dr. 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 Multiple cecal/ascending colon AVMs ablated. 1.5 cm carpet polyp in the ascending segment status post piecemeal snare polypectomy and tattooing. Innocent appearing colonic diverticulosis.   COLONOSCOPY N/A 06/14/2016   Procedure: COLONOSCOPY;  Surgeon: Daneil Dolin, MD;  [facility];  Service: Endoscopy;  Laterality: N/A;  1200   COLONOSCOPY WITH PROPOFOL N/A 11/28/2018   Procedure: COLONOSCOPY WITH PROPOFOL;  Surgeon: Daneil Dolin, MD;  [facility];  Service: Endoscopy;  Laterality: N/A;  8:30am   ESOPHAGOGASTRODUODENOSCOPY N/A 01/01/2016   RMR: Multiple gastric and duodenal erosions likely NSAID related.   GIVENS CAPSULE STUDY N/A 01/03/2016   Incomplete study, capsule never reach cecum. Scattered erosions and AVMs throughout the small bowel.   JOINT REPLACEMENT     POLYPECTOMY  11/28/2018   Procedure: POLYPECTOMY;  Surgeon: Daneil Dolin, MD;  [facility];  Service: Endoscopy;;  ascending colon   REPLACEMENT TOTAL KNEE Left    SHOULDER ARTHROSCOPY WITH DISTAL CLAVICLE RESECTION Right 05/28/2015   Procedure: SHOULDER ARTHROSCOPY WITH DISTAL CLAVICLE RESECTION;  Surgeon: Melrose Nakayama, MD;  Location: [place];  Service: Orthopedics;  Laterality: Right;   SHOULDER ARTHROSCOPY WITH SUBACROMIAL DECOMPRESSION Right 05/28/2015   Procedure: SHOULDER ARTHROSCOPY WITH SUBACROMIAL DECOMPRESSION;  Surgeon: Melrose Nakayama, MD;  Location: [place];  Service: Orthopedics;  Laterality: Right;   THROAT SURGERY  1965   TOTAL KNEE ARTHROPLASTY Right 09/06/2021   Procedure: TOTAL KNEE ARTHROPLASTY;  Surgeon: Paralee Cancel, MD;  [facility];  Service: Orthopedics;  Laterality: Right;    There were no vitals filed for this visit.   Subjective Assessment - 09/23/21 1439     Subjective "alot of up and down today doing laundry"    Currently in Pain?  Yes    Pain Score 3     Pain Location Knee    Pain Orientation Right    Pain Descriptors / Indicators Sore    Pain Type Surgical pain                               [facility] Adult PT Treatment/Exercise - 09/23/21 0001       Knee/Hip Exercises: Stretches   Knee: Self-Stretch to increase Flexion Right    Knee: Self-Stretch Limitations 2x10 3 sec hold    Gastroc Stretch Right;2 reps;60 seconds    Gastroc Stretch Limitations slantboard      Knee/Hip Exercises: Aerobic   Nustep level 1 x 5 min for AROM      Knee/Hip Exercises:  Machines for Strengthening   Cybex Knee Flexion 6 plates, 3x10      Knee/Hip Exercises: Standing   Heel Raises Both;2 sets;10 reps      Knee/Hip Exercises: Supine   Quad Sets Strengthening;Right;3 sets;10 reps    Heel Slides AROM;Right;3 sets;10 reps    Knee Extension AROM;Right    Knee Extension Limitations 10   lacking   Knee Flexion AROM;Right    Knee Flexion Limitations 110                       PT Short Term Goals - 09/23/21 1440       PT SHORT TERM GOAL #1   Title Patient will be independent with HEP in order to improve functional outcomes.    Time 3    Period Weeks    Status On-going    Target Date 10/03/21      PT SHORT TERM GOAL #2   Title Demonstrate right knee ROM to full extension, 100 degrees flexion to enable normalized kinematics    Baseline lacking 21 degree extension, 82 flexion    Time 3    Period Weeks    Status On-going    Target Date 10/03/21               PT Long Term Goals - 09/12/21 1520       PT LONG TERM GOAL #1   Title Patient will improve FOTO score by at least 10 points in order to indicate improved tolerance to activity.    Baseline 28% function    Time 6    Period Weeks    Status New    Target Date 10/24/21      PT LONG TERM GOAL #2   Title Patient will be able to ambulate at least 250 feet in 2MWT in order to demonstrate improved gait speed for community ambulation.    Baseline 100 ft with RW    Time 6    Period Weeks    Status New    Target Date 10/24/21                   Plan - 09/23/21 1514     Clinical Impression Statement Continues to lack right terminal knee extension. Poorly tolerates knee extension activities for static stretch/quad activation.  Educated on trial of prone knee hang to seee if this is more comfortable. Continued sessions indicated to progress right knee ROM/strength and progress to cane for ambulation    Personal Factors and Comorbidities Age;Comorbidity 1;Time since onset of  injury/illness/exacerbation    Comorbidities Tbco abuse    Examination-Activity Limitations Bed Mobility;Carry;Lift;Toileting;Stand;Stairs;Squat;Reach Overhead;Transfers;Locomotion Level  Examination-Participation Restrictions Cleaning;Community Activity;Laundry;Yard Work;Shop;Meal Prep    Stability/Clinical Decision Making Stable/Uncomplicated    Rehab Potential Good    PT Frequency 3x / week    PT Duration 6 weeks    PT Treatment/Interventions ADLs/Self Care Home Management;Gait training;Stair training;Functional mobility training;Therapeutic activities;Therapeutic exercise;Balance training;Patient/family education;Neuromuscular re-education;Manual techniques;Passive range of motion;Dry needling;Spinal Manipulations;Joint Manipulations    PT Next Visit Plan Continue with TKR rehab    PT Home Exercise Plan QS, heel slides, heel propr for extensoin    Consulted and Agree with Plan of Care Patient             Patient will benefit from skilled therapeutic intervention in order to improve the following deficits and impairments:  Abnormal gait, Decreased activity tolerance, Decreased mobility, Decreased range of motion, Decreased strength, Difficulty walking, Increased edema, Pain  Visit Diagnosis: Difficulty in walking, not elsewhere classified  Muscle weakness (generalized)  Other abnormalities of gait and mobility  Other symptoms and signs involving the musculoskeletal system     Problem List Patient Active Problem List   Diagnosis Date Noted   S/P total knee arthroplasty, 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   Diverticulosis of colon without hemorrhage    Solitary pulmonary nodule 02/10/2016   Abnormal CT scan 02/02/2016   Chronic GI bleeding 02/01/2016   Anemia due to chronic blood loss 02/01/2016   Colon polyps 01/04/2016   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 [place], [place], [address] Phone: [phone]   Fax:  [phone]  Name: Todd George MRN: [id number] Date of Birth: 09-21-1941

## 2021-09-26 ENCOUNTER — Other Ambulatory Visit: Payer: Self-pay

## 2021-09-26 ENCOUNTER — Ambulatory Visit (HOSPITAL_COMMUNITY): Payer: Medicare HMO | Attending: Orthopedic Surgery

## 2021-09-26 DIAGNOSIS — R29898 Other symptoms and signs involving the musculoskeletal system: Secondary | ICD-10-CM | POA: Insufficient documentation

## 2021-09-26 DIAGNOSIS — R262 Difficulty in walking, not elsewhere classified: Secondary | ICD-10-CM | POA: Diagnosis not present

## 2021-09-26 DIAGNOSIS — M6281 Muscle weakness (generalized): Secondary | ICD-10-CM | POA: Insufficient documentation

## 2021-09-26 DIAGNOSIS — R2689 Other abnormalities of gait and mobility: Secondary | ICD-10-CM | POA: Insufficient documentation

## 2021-09-26 NOTE — Therapy (Signed)
[place], [place], [address] Phone: [phone]   Fax:  [phone]  Physical Therapy Treatment  Patient Details  Name: DELANDO SATTER MRN: [id number] Date of Birth: [birth year] Referring Provider (PT): Dr. 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   BPH (benign prostatic hyperplasia)    Cancer ([place])    skin cancer   Carotid artery disease ([place])    Cellulitis    Chronic back pain    Colon polyps 01/04/2016   COPD (chronic obstructive pulmonary disease) ([place])    2ppd   Diabetes mellitus without complication ([place])    Duodenal erosion 01/02/2016   Gastric erosion with bleeding 01/02/2016   Hyperlipidemia    Hypertension    OSA on CPAP    not used machine in over 10 yrs    Past Surgical History:  Procedure Laterality Date   CHOLECYSTECTOMY     COLONOSCOPY N/A 01/03/2016   RMR: Active bleeding seen arising from the small bowel. Multiple cecal/ascending colon AVMs ablated. 1.5 cm carpet polyp in the ascending segment status post piecemeal snare polypectomy and tattooing. Innocent appearing colonic diverticulosis.   COLONOSCOPY N/A 06/14/2016   Procedure: COLONOSCOPY;  Surgeon: Daneil Dolin, MD;  [facility];  Service: Endoscopy;  Laterality: N/A;  1200   COLONOSCOPY WITH PROPOFOL N/A 11/28/2018   Procedure: COLONOSCOPY WITH PROPOFOL;  Surgeon: Daneil Dolin, MD;  [facility];  Service: Endoscopy;  Laterality: N/A;  8:30am   ESOPHAGOGASTRODUODENOSCOPY N/A 01/01/2016   RMR: Multiple gastric and duodenal erosions likely NSAID related.   GIVENS CAPSULE STUDY N/A 01/03/2016   Incomplete study, capsule never reach cecum. Scattered erosions and AVMs throughout the small bowel.   JOINT REPLACEMENT     POLYPECTOMY  11/28/2018   Procedure: POLYPECTOMY;  Surgeon: Daneil Dolin, MD;  [facility];  Service: Endoscopy;;  ascending colon   REPLACEMENT TOTAL KNEE Left    SHOULDER ARTHROSCOPY WITH DISTAL CLAVICLE RESECTION Right 05/28/2015   Procedure: SHOULDER ARTHROSCOPY WITH DISTAL CLAVICLE RESECTION;  Surgeon: Melrose Nakayama, MD;  Location: [place];  Service: Orthopedics;  Laterality: Right;   SHOULDER ARTHROSCOPY WITH SUBACROMIAL DECOMPRESSION Right 05/28/2015   Procedure: SHOULDER ARTHROSCOPY WITH SUBACROMIAL DECOMPRESSION;  Surgeon: Melrose Nakayama, MD;  Location: [place];  Service: Orthopedics;  Laterality: Right;   THROAT SURGERY  1965   TOTAL KNEE ARTHROPLASTY Right 09/06/2021   Procedure: TOTAL KNEE ARTHROPLASTY;  Surgeon: Paralee Cancel, MD;  [facility];  Service: Orthopedics;  Laterality: Right;    There were no vitals filed for this visit.   Subjective Assessment - 09/26/21 1436     Subjective Good check up with the Dr. the other day.  "Had a shower the  other day and it was difficulty stepping over the tub to get in"    Currently in Pain? Yes    Pain Score 4     Pain Location Knee    Pain Orientation Right    Pain Descriptors / Indicators Sore    Pain Type Surgical pain                [facility] PT Assessment - 09/26/21 0001       Assessment   Medical Diagnosis Right TKR    Referring Provider (PT) Dr. Paralee Cancel    Onset Date/Surgical Date 09/06/21                           [facility] Adult PT Treatment/Exercise - 09/26/21 0001       Ambulation/Gait   Ambulation/Gait Yes     Ambulation/Gait Assistance 5: Supervision    Assistive device Straight cane    Gait Comments gait training on level surfaces with SBA-to improve safety/sequence with cane for ambulation      Knee/Hip Exercises: Aerobic   Nustep level 1 x 5 min for AROM      Knee/Hip Exercises: Machines for Strengthening   Cybex Knee Flexion 6 plates, 3x10      Knee/Hip Exercises: Standing   Heel Raises Both;2 sets;10 reps    Lateral Step Up --    Forward Step Up Right;2 sets;10 reps;Step Height: 4"    Other Standing Knee Exercises sidestepping x 2 min      Knee/Hip Exercises: Seated   Long Arc Quad AAROM;Right;3 sets;10 reps                       PT Short Term Goals - 09/23/21 1440       PT SHORT TERM GOAL #1   Title Patient will be independent with HEP in order to improve functional outcomes.    Time 3    Period Weeks    Status On-going    Target Date 10/03/21      PT SHORT TERM GOAL #2   Title Demonstrate right knee ROM to full extension, 100 degrees flexion to enable normalized kinematics    Baseline lacking 21 degree extension, 82 flexion    Time 3    Period Weeks    Status On-going    Target Date 10/03/21               PT Long Term Goals - 09/12/21 1520       PT LONG TERM GOAL #1   Title Patient will improve FOTO score by at least 10 points in order to indicate improved tolerance to activity.    Baseline 28% function    Time 6    Period Weeks    Status New    Target Date 10/24/21      PT LONG TERM GOAL #2   Title Patient will be able to ambulate at least 250 feet in 2MWT in order to demonstrate improved gait speed for community ambulation.    Baseline 100 ft with RW    Time 6    Period Weeks    Status New    Target Date 10/24/21                   Plan - 09/26/21 1515     Clinical Impression Statement Progressing with POC details and demonstrating improved RLE ROM and strength with decrease in antalgic pattern  appreciated and improved  weight acceptance stance phase RLE. Recommend trials of cane at home and for next appointment.    Personal Factors and Comorbidities Age;Comorbidity 1;Time since onset of injury/illness/exacerbation    Comorbidities Tbco abuse    Examination-Activity Limitations Bed Mobility;Carry;Lift;Toileting;Stand;Stairs;Squat;Reach Overhead;Transfers;Locomotion Level    Examination-Participation Restrictions Cleaning;Community Activity;Laundry;Yard Work;Shop;Meal Prep    Stability/Clinical Decision Making Stable/Uncomplicated    Rehab Potential Good    PT Frequency 3x / week    PT Duration 6 weeks    PT Treatment/Interventions ADLs/Self Care Home Management;Gait training;Stair training;Functional mobility training;Therapeutic activities;Therapeutic exercise;Balance training;Patient/family education;Neuromuscular re-education;Manual techniques;Passive range of motion;Dry needling;Spinal Manipulations;Joint Manipulations    PT Next Visit Plan Continue with TKR rehab    PT Home Exercise Plan QS, heel slides, heel propr for extensoin    Consulted and Agree with Plan of Care Patient             Patient will benefit from skilled therapeutic intervention in order to improve the following deficits and impairments:  Abnormal gait, Decreased activity tolerance, Decreased mobility, Decreased range of motion, Decreased strength, Difficulty walking, Increased edema, Pain  Visit Diagnosis: Difficulty in walking, not elsewhere classified  Muscle weakness (generalized)  Other abnormalities of gait and mobility  Other symptoms and signs involving the musculoskeletal system     Problem List Patient Active Problem List   Diagnosis Date Noted   S/P total knee arthroplasty, 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   Diverticulosis of colon without hemorrhage    Solitary pulmonary nodule 02/10/2016   Abnormal CT scan 02/02/2016   Chronic GI bleeding 02/01/2016   Anemia due to chronic blood loss 02/01/2016   Colon polyps 01/04/2016    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 [place], [place], [address] Phone: [phone]   Fax:  [phone]  Name: NALU TROUBLEFIELD MRN: [id number] Date of Birth: Sep 24, 1941

## 2021-09-28 ENCOUNTER — Ambulatory Visit (HOSPITAL_COMMUNITY): Payer: Medicare HMO | Admitting: Physical Therapy

## 2021-09-28 ENCOUNTER — Other Ambulatory Visit: Payer: Self-pay

## 2021-09-28 DIAGNOSIS — M6281 Muscle weakness (generalized): Secondary | ICD-10-CM | POA: Diagnosis not present

## 2021-09-28 DIAGNOSIS — R262 Difficulty in walking, not elsewhere classified: Secondary | ICD-10-CM | POA: Diagnosis not present

## 2021-09-28 DIAGNOSIS — R2689 Other abnormalities of gait and mobility: Secondary | ICD-10-CM | POA: Diagnosis not present

## 2021-09-28 DIAGNOSIS — R29898 Other symptoms and signs involving the musculoskeletal system: Secondary | ICD-10-CM | POA: Diagnosis not present

## 2021-09-28 NOTE — Therapy (Signed)
[place], [place], [address] Phone: [phone]   Fax:  [phone]  Physical Therapy Treatment  Patient Details  Name: Todd George MRN: [id number] Date of Birth: 30-Jun-1941 Referring Provider (PT): Dr. 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 Multiple cecal/ascending colon AVMs ablated. 1.5 cm carpet polyp in the ascending segment status post piecemeal snare polypectomy and tattooing. Innocent appearing colonic diverticulosis.   COLONOSCOPY N/A 06/14/2016   Procedure: COLONOSCOPY;  Surgeon: Daneil Dolin, MD;  [facility];  Service: Endoscopy;  Laterality: N/A;  1200   COLONOSCOPY WITH PROPOFOL N/A 11/28/2018   Procedure: COLONOSCOPY WITH PROPOFOL;  Surgeon: Daneil Dolin, MD;  [facility];   Service: Endoscopy;  Laterality: N/A;  8:30am   ESOPHAGOGASTRODUODENOSCOPY N/A 01/01/2016   RMR: Multiple gastric and duodenal erosions likely NSAID related.   GIVENS CAPSULE STUDY N/A 01/03/2016   Incomplete study, capsule never reach cecum. Scattered erosions and AVMs throughout the small bowel.   JOINT REPLACEMENT     POLYPECTOMY  11/28/2018   Procedure: POLYPECTOMY;  Surgeon: Daneil Dolin, MD;  [facility];  Service: Endoscopy;;  ascending colon   REPLACEMENT TOTAL KNEE Left    SHOULDER ARTHROSCOPY WITH DISTAL CLAVICLE RESECTION Right 05/28/2015   Procedure: SHOULDER ARTHROSCOPY WITH DISTAL CLAVICLE RESECTION;  Surgeon: Melrose Nakayama, MD;  Location: [place];  Service: Orthopedics;  Laterality: Right;   SHOULDER ARTHROSCOPY WITH SUBACROMIAL DECOMPRESSION Right 05/28/2015   Procedure: SHOULDER ARTHROSCOPY WITH SUBACROMIAL DECOMPRESSION;  Surgeon: Melrose Nakayama, MD;  Location: [place];  Service: Orthopedics;  Laterality: Right;   THROAT SURGERY  1965   TOTAL KNEE ARTHROPLASTY Right 09/06/2021   Procedure: TOTAL KNEE ARTHROPLASTY;  Surgeon: Paralee Cancel, MD;  [facility];  Service: Orthopedics;  Laterality: Right;    There were no vitals filed for this visit.   Subjective Assessment - 09/28/21 1447     Subjective PT states that he has been hurting ever since last treatment.  Currently in Pain? Yes    Pain Score 6     Pain Location Knee    Pain Orientation Right;Anterior    Pain Descriptors / Indicators Aching    Pain Type Surgical pain    Pain Onset In the past 7 days    Pain Frequency Constant    Aggravating Factors  moving    Pain Relieving Factors not sure                               [facility] Adult PT Treatment/Exercise - 09/28/21 0001       Exercises   Exercises Knee/Hip      Knee/Hip Exercises: Stretches   Active Hamstring Stretch Right;3 reps;30 seconds      Knee/Hip Exercises: Seated   Long Arc  Quad Right;AROM;10 reps    Heel Slides Right;10 reps      Knee/Hip Exercises: Supine   Quad Sets Strengthening;Right;10 reps    Short Arc Quad Sets Strengthening;Right;10 reps;1 set    Heel Slides Right;10 reps    Terminal Knee Extension Strengthening;Right;10 reps    Heel Prop for Knee Extension 2 minutes   30 sec increments   Knee Extension AROM;Right    Knee Extension Limitations 7    Knee Flexion AROM;Right    Knee Flexion Limitations 117      Manual Therapy   Manual Therapy Edema management;Soft tissue mobilization    Manual therapy comments completed seperate from all other aspects of treatment    Edema Management decongestive techniques to decrease edema to improve ROM    Soft tissue mobilization to hamstring mm to relax mm for decrease pain                       PT Short Term Goals - 09/23/21 1440       PT SHORT TERM GOAL #1   Title Patient will be independent with HEP in order to improve functional outcomes.    Time 3    Period Weeks    Status On-going    Target Date 10/03/21      PT SHORT TERM GOAL #2   Title Demonstrate right knee ROM to full extension, 100 degrees flexion to enable normalized kinematics    Baseline lacking 21 degree extension, 82 flexion    Time 3    Period Weeks    Status On-going    Target Date 10/03/21               PT Long Term Goals - 09/12/21 1520       PT LONG TERM GOAL #1   Title Patient will improve FOTO score by at least 10 points in order to indicate improved tolerance to activity.    Baseline 28% function    Time 6    Period Weeks    Status New    Target Date 10/24/21      PT LONG TERM GOAL #2   Title Patient will be able to ambulate at least 250 feet in 2MWT in order to demonstrate improved gait speed for community ambulation.    Baseline 100 ft with RW    Time 6    Period Weeks    Status New    Target Date 10/24/21                   Plan - 09/28/21 1527     Clinical Impression  Statement  Pt with increased pain and swelling today.  Treatment began with manual to decrease edema at tightness of hamstrings.  Treatment focused on improving ROM and terminal extension strength.  Pt does not feel comfortable walking with a cane at this time, therapist stated not to ambulate with cane if he did not feel safe doing so at this time.    Personal Factors and Comorbidities Age;Comorbidity 1;Time since onset of injury/illness/exacerbation    Comorbidities Tbco abuse    Examination-Activity Limitations Bed Mobility;Carry;Lift;Toileting;Stand;Stairs;Squat;Reach Overhead;Transfers;Locomotion Level    Examination-Participation Restrictions Cleaning;Community Activity;Laundry;Yard Work;Shop;Meal Prep    Stability/Clinical Decision Making Stable/Uncomplicated    Rehab Potential Good    PT Frequency 3x / week    PT Duration 6 weeks    PT Treatment/Interventions ADLs/Self Care Home Management;Gait training;Stair training;Functional mobility training;Therapeutic activities;Therapeutic exercise;Balance training;Patient/family education;Neuromuscular re-education;Manual techniques;Passive range of motion;Dry needling;Spinal Manipulations;Joint Manipulations    PT Next Visit Plan Continue with TKR rehab    PT Home Exercise Plan QS, heel slides, heel propr for extensoin    Consulted and Agree with Plan of Care Patient             Patient will benefit from skilled therapeutic intervention in order to improve the following deficits and impairments:  Abnormal gait, Decreased activity tolerance, Decreased mobility, Decreased range of motion, Decreased strength, Difficulty walking, Increased edema, Pain  Visit Diagnosis: Difficulty in walking, not elsewhere classified  Muscle weakness (generalized)  Other abnormalities of gait and mobility     Problem List Patient Active Problem List   Diagnosis Date Noted   S/P total knee arthroplasty, 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   Diverticulosis of colon without  hemorrhage    Solitary pulmonary nodule 02/10/2016   Abnormal CT scan 02/02/2016   Chronic GI bleeding 02/01/2016   Anemia due to chronic blood loss 02/01/2016   Colon polyps 01/04/2016   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   Gastric erosion with bleeding 01/02/2016   Duodenal erosion 01/02/2016   Tobacco abuse 01/01/2016   Essential hypertension 01/01/2016   Mucosal abnormality of stomach    GI bleed 12/31/2015   Diabetes mellitus without complication (HCC)    Hyperlipidemia    BPH (benign prostatic hyperplasia)    Carotid artery disease ([facility])    OSA on CPAP    COPD (chronic obstructive pulmonary disease) [facility])    Rayetta Humphrey, PT CLT [phone]  09/28/2021, 3:30 PM  [place], [place], [address] Phone: [phone]   Fax:  [phone]  Name: Todd George MRN: [id number] Date of Birth: Feb 02, 1941

## 2021-09-30 ENCOUNTER — Telehealth (HOSPITAL_COMMUNITY): Payer: Self-pay | Admitting: Physical Therapy

## 2021-09-30 ENCOUNTER — Encounter (HOSPITAL_COMMUNITY): Payer: Medicare HMO | Admitting: Physical Therapy

## 2021-09-30 NOTE — Telephone Encounter (Signed)
Called pt re missed appointment.  Pt stated that he started to come to treatment when all of a sudden he became dizzy and weak.  He states that he is feeling better already and does not need to go to the ER.  Rayetta Humphrey, [place] CLT [phone]

## 2021-10-03 ENCOUNTER — Ambulatory Visit (HOSPITAL_COMMUNITY): Payer: Medicare HMO | Admitting: Physical Therapy

## 2021-10-03 ENCOUNTER — Other Ambulatory Visit: Payer: Self-pay

## 2021-10-03 DIAGNOSIS — R29898 Other symptoms and signs involving the musculoskeletal system: Secondary | ICD-10-CM | POA: Diagnosis not present

## 2021-10-03 DIAGNOSIS — R262 Difficulty in walking, not elsewhere classified: Secondary | ICD-10-CM | POA: Diagnosis not present

## 2021-10-03 DIAGNOSIS — M6281 Muscle weakness (generalized): Secondary | ICD-10-CM | POA: Diagnosis not present

## 2021-10-03 DIAGNOSIS — R2689 Other abnormalities of gait and mobility: Secondary | ICD-10-CM

## 2021-10-03 NOTE — Patient Instructions (Signed)
Access Code: [email or web address] URL: [email or web address] Date: 10/03/2021 Prepared by: Josue Hector  Exercises Hooklying Gluteal Sets - 3 x daily - 7 x weekly - 2 sets - 10 reps - 5 secon hold Seated Calf Stretch with Strap - 3 x daily - 7 x weekly - 1 sets - 3 reps - 30 second hold Seated Hamstring Stretch - 3 x daily - 7 x weekly - 1 sets - 3 reps - second hold

## 2021-10-03 NOTE — Therapy (Signed)
[place], [place], [address] Phone: [phone]   Fax:  [phone]  Physical Therapy Treatment  Patient Details  Name: Todd George MRN: [id number] Date of Birth: [birth year] Referring Provider (PT): Dr. Paralee Cancel   Encounter Date: 10/03/2021   PT End of Session - 10/03/21 1440     Visit Number 8    Number of Visits 18    Date for PT Re-Evaluation 10/24/21    Authorization Type [employer] H[employer]    Authorization Time Period 12 visits 9/19-10/31/22    Authorization - Visit Number 8    Authorization - Number of Visits 12    Progress Note Due on Visit 10    PT Start Time 1435    PT Stop Time 1513    PT Time Calculation (min) 38 min    Activity Tolerance Patient tolerated treatment well    Behavior During Therapy [place] for tasks assessed/performed             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   BPH (benign prostatic hyperplasia)    Cancer ([place])    skin cancer   Carotid artery disease ([place])    Cellulitis    Chronic back pain    Colon polyps 01/04/2016   COPD (chronic obstructive pulmonary disease) ([place])    2ppd   Diabetes mellitus without complication ([place])    Duodenal erosion 01/02/2016   Gastric erosion with bleeding 01/02/2016   Hyperlipidemia    Hypertension    OSA on CPAP    not used machine in over 10 yrs    Past Surgical History:  Procedure Laterality Date   CHOLECYSTECTOMY     COLONOSCOPY N/A 01/03/2016   RMR: Active bleeding seen arising from the small bowel. Multiple cecal/ascending colon AVMs ablated. 1.5 cm carpet polyp in the ascending segment status post piecemeal snare polypectomy and tattooing. Innocent appearing colonic diverticulosis.   COLONOSCOPY N/A 06/14/2016   Procedure: COLONOSCOPY;  Surgeon: Daneil Dolin, MD;  [facility];  Service: Endoscopy;  Laterality: N/A;  1200    COLONOSCOPY WITH PROPOFOL N/A 11/28/2018   Procedure: COLONOSCOPY WITH PROPOFOL;  Surgeon: Daneil Dolin, MD;  [facility];  Service: Endoscopy;  Laterality: N/A;  8:30am   ESOPHAGOGASTRODUODENOSCOPY N/A 01/01/2016   RMR: Multiple gastric and duodenal erosions likely NSAID related.   GIVENS CAPSULE STUDY N/A 01/03/2016   Incomplete study, capsule never reach cecum. Scattered erosions and AVMs throughout the small bowel.   JOINT REPLACEMENT     POLYPECTOMY  11/28/2018   Procedure: POLYPECTOMY;  Surgeon: Daneil Dolin, MD;  [facility];  Service: Endoscopy;;  ascending colon   REPLACEMENT TOTAL KNEE Left    SHOULDER ARTHROSCOPY WITH DISTAL CLAVICLE RESECTION Right 05/28/2015   Procedure: SHOULDER ARTHROSCOPY WITH DISTAL CLAVICLE RESECTION;  Surgeon: Melrose Nakayama, MD;  Location: [place];  Service: Orthopedics;  Laterality: Right;   SHOULDER ARTHROSCOPY WITH SUBACROMIAL DECOMPRESSION Right 05/28/2015   Procedure: SHOULDER ARTHROSCOPY WITH SUBACROMIAL DECOMPRESSION;  Surgeon: Melrose Nakayama, MD;  Location: [place];  Service: Orthopedics;  Laterality: Right;   THROAT SURGERY  1965   TOTAL KNEE ARTHROPLASTY Right 09/06/2021   Procedure: TOTAL KNEE ARTHROPLASTY;  Surgeon: Paralee Cancel, MD;  [facility];  Service: Orthopedics;  Laterality: Right;    There were no vitals filed for this visit.  Subjective Assessment - 10/03/21 1438     Subjective Patient states he "overdid it" this weekend. He states he is "paying for it" today.    Currently in Pain? Yes    Pain Score 8     Pain Location Knee    Pain Orientation Right;Anterior    Pain Descriptors / Indicators Aching;Sore    Pain Type Surgical pain    Pain Onset In the past 7 days    Pain Frequency Constant                               [facility] Adult PT Treatment/Exercise - 10/03/21 0001       Knee/Hip Exercises: Standing   Other Standing Knee Exercises  standing mini lunge/ calf stretch 5 x 5" using RW      Knee/Hip Exercises: Seated   Other Seated Knee/Hip Exercises heel/toe raises x20    Marching Right;20 reps      Knee/Hip Exercises: Supine   Quad Sets Right;1 set;15 reps    Quad Sets Limitations 5"    Heel Slides Right;10 reps    Heel Prop for Knee Extension Other (comment)   3 x 30"   Other Supine Knee/Hip Exercises glute set 2 set 10 x 5"                       PT Short Term Goals - 09/23/21 1440       PT SHORT TERM GOAL #1   Title Patient will be independent with HEP in order to improve functional outcomes.    Time 3    Period Weeks    Status On-going    Target Date 10/03/21      PT SHORT TERM GOAL #2   Title Demonstrate right knee ROM to full extension, 100 degrees flexion to enable normalized kinematics    Baseline lacking 21 degree extension, 82 flexion    Time 3    Period Weeks    Status On-going    Target Date 10/03/21               PT Long Term Goals - 09/12/21 1520       PT LONG TERM GOAL #1   Title Patient will improve FOTO score by at least 10 points in order to indicate improved tolerance to activity.    Baseline 28% function    Time 6    Period Weeks    Status New    Target Date 10/24/21      PT LONG TERM GOAL #2   Title Patient will be able to ambulate at least 250 feet in 2MWT in order to demonstrate improved gait speed for community ambulation.    Baseline 100 ft with RW    Time 6    Period Weeks    Status New    Target Date 10/24/21                   Plan - 10/03/21 1512     Clinical Impression Statement Patient tolerated session well. Introduced patient to glute sets for strengthening and due to high patient levels to complete isometrics away from knee. Educated patient on importance of heel slides beyond gaining flexion ROM and to keep the knee moving. Patient reported overall feeling of accomplishment following end of session and decreased overall pain  rating. Patient continues to require skilled therapy to address deficits in ROM, pain  and strengthening of LE for return of prior function and completion of ADLs.    Personal Factors and Comorbidities Age;Comorbidity 1;Time since onset of injury/illness/exacerbation    Comorbidities Tbco abuse    Examination-Activity Limitations Bed Mobility;Carry;Lift;Toileting;Stand;Stairs;Squat;Reach Overhead;Transfers;Locomotion Level    Examination-Participation Restrictions Cleaning;Community Activity;Laundry;Yard Work;Shop;Meal Prep    Stability/Clinical Decision Making Stable/Uncomplicated    Rehab Potential Good    PT Frequency 3x / week    PT Duration 6 weeks    PT Treatment/Interventions ADLs/Self Care Home Management;Gait training;Stair training;Functional mobility training;Therapeutic activities;Therapeutic exercise;Balance training;Patient/family education;Neuromuscular re-education;Manual techniques;Passive range of motion;Dry needling;Spinal Manipulations;Joint Manipulations    PT Next Visit Plan Continue with TKR rehab    PT Home Exercise Plan QS, heel slides, heel propr for extensoin 10/10 seated calf stretch, HS stretch, glute set    Consulted and Agree with Plan of Care Patient             Patient will benefit from skilled therapeutic intervention in order to improve the following deficits and impairments:  Abnormal gait, Decreased activity tolerance, Decreased mobility, Decreased range of motion, Decreased strength, Difficulty walking, Increased edema, Pain  Visit Diagnosis: Difficulty in walking, not elsewhere classified  Muscle weakness (generalized)  Other abnormalities of gait and mobility     Problem List Patient Active Problem List   Diagnosis Date Noted   S/P total knee arthroplasty, 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   Diverticulosis of colon without hemorrhage    Solitary pulmonary nodule 02/10/2016   Abnormal CT scan 02/02/2016   Chronic GI bleeding 02/01/2016   Anemia due to  chronic blood loss 02/01/2016   Colon polyps 01/04/2016   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   Gastric erosion with bleeding 01/02/2016   Duodenal erosion 01/02/2016   Tobacco abuse 01/01/2016   Essential hypertension 01/01/2016   Mucosal abnormality of stomach    GI bleed 12/31/2015   Diabetes mellitus without complication (HCC)    Hyperlipidemia    BPH (benign prostatic hyperplasia)    Carotid artery disease (H[facility])    OSA on CPAP    COPD (chronic obstructive pulmonary disease) ([place])    4:17 PM, 10/03/21 Josue Hector PT DPT  Physical Therapist with [facility]  [phone] [place], [place], [address] Phone: [phone]   Fax:  [phone]  Name: Todd George MRN: [id number] Date of Birth: [birth year]

## 2021-10-05 ENCOUNTER — Other Ambulatory Visit: Payer: Self-pay

## 2021-10-05 ENCOUNTER — Ambulatory Visit (HOSPITAL_COMMUNITY): Payer: Medicare HMO | Admitting: Physical Therapy

## 2021-10-05 DIAGNOSIS — R262 Difficulty in walking, not elsewhere classified: Secondary | ICD-10-CM | POA: Diagnosis not present

## 2021-10-05 DIAGNOSIS — R2689 Other abnormalities of gait and mobility: Secondary | ICD-10-CM | POA: Diagnosis not present

## 2021-10-05 DIAGNOSIS — R29898 Other symptoms and signs involving the musculoskeletal system: Secondary | ICD-10-CM | POA: Diagnosis not present

## 2021-10-05 DIAGNOSIS — M6281 Muscle weakness (generalized): Secondary | ICD-10-CM

## 2021-10-05 NOTE — Therapy (Signed)
[place], [place], [address] Phone: [phone]   Fax:  [phone]  Physical Therapy Treatment  Patient Details  Name: Todd George MRN: [id number] Date of Birth: June 12, 1941 Referring Provider (PT): Dr. Paralee Cancel   Encounter Date: 10/05/2021   PT End of Session - 10/05/21 1518     Visit Number 9    Number of Visits 18    Date for PT Re-Evaluation 10/24/21    Authorization Type [employer]    Authorization Time Period 12 visits 9/19-10/31/22    Authorization - Visit Number 9    Authorization - Number of Visits 12    Progress Note Due on Visit 10    PT Start Time 3888    PT Stop Time 2800    PT Time Calculation (min) 45 min    Activity Tolerance Patient tolerated treatment well    Behavior During Therapy [facility] for tasks assessed/performed             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   BPH (benign prostatic hyperplasia)    Cancer ([place])    skin cancer   Carotid artery disease ([place])    Cellulitis    Chronic back pain    Colon polyps 01/04/2016   COPD (chronic obstructive pulmonary disease) ([place])    2ppd   Diabetes mellitus without complication ([place])    Duodenal erosion 01/02/2016   Gastric erosion with bleeding 01/02/2016   Hyperlipidemia    Hypertension    OSA on CPAP    not used machine in over 10 yrs    Past Surgical History:  Procedure Laterality Date   CHOLECYSTECTOMY     COLONOSCOPY N/A 01/03/2016   RMR: Active bleeding seen arising from the small bowel. Multiple cecal/ascending colon AVMs ablated. 1.5 cm carpet polyp in the ascending segment status post piecemeal snare polypectomy and tattooing. Innocent appearing colonic diverticulosis.   COLONOSCOPY N/A 06/14/2016   Procedure: COLONOSCOPY;  Surgeon: Daneil Dolin, MD;  [facility];  Service: Endoscopy;  Laterality: N/A;  1200    COLONOSCOPY WITH PROPOFOL N/A 11/28/2018   Procedure: COLONOSCOPY WITH PROPOFOL;  Surgeon: Daneil Dolin, MD;  [facility];  Service: Endoscopy;  Laterality: N/A;  8:30am   ESOPHAGOGASTRODUODENOSCOPY N/A 01/01/2016   RMR: Multiple gastric and duodenal erosions likely NSAID related.   GIVENS CAPSULE STUDY N/A 01/03/2016   Incomplete study, capsule never reach cecum. Scattered erosions and AVMs throughout the small bowel.   JOINT REPLACEMENT     POLYPECTOMY  11/28/2018   Procedure: POLYPECTOMY;  Surgeon: Daneil Dolin, MD;  [facility];  Service: Endoscopy;;  ascending colon   REPLACEMENT TOTAL KNEE Left    SHOULDER ARTHROSCOPY WITH DISTAL CLAVICLE RESECTION Right 05/28/2015   Procedure: SHOULDER ARTHROSCOPY WITH DISTAL CLAVICLE RESECTION;  Surgeon: Melrose Nakayama, MD;  Location: [facility];  Service: Orthopedics;  Laterality: Right;   SHOULDER ARTHROSCOPY WITH SUBACROMIAL DECOMPRESSION Right 05/28/2015   Procedure: SHOULDER ARTHROSCOPY WITH SUBACROMIAL DECOMPRESSION;  Surgeon: Melrose Nakayama, MD;  Location: [facility];  Service: Orthopedics;  Laterality: Right;   THROAT SURGERY  1965   TOTAL KNEE ARTHROPLASTY Right 09/06/2021   Procedure: TOTAL KNEE ARTHROPLASTY;  Surgeon: Paralee Cancel, MD;  [facility];  Service: Orthopedics;  Laterality: Right;    There were no vitals filed for this visit.  Subjective Assessment - 10/05/21 1447     Subjective Todd George states that his knee is feeling awful.    Currently in Pain? Yes    Pain Score 8     Pain Location Knee    Pain Orientation Right;Anterior    Pain Descriptors / Indicators Aching    Pain Type Acute pain    Pain Onset In the past 7 days    Pain Frequency Constant                               [facility] Adult PT Treatment/Exercise - 10/05/21 0001       Exercises   Exercises Knee/Hip      Knee/Hip Exercises: Stretches   Active Hamstring Stretch Right;3  reps;30 seconds    Passive Hamstring Stretch Right;2 reps;30 seconds      Knee/Hip Exercises: Supine   Quad Sets Right;1 set;15 reps    Short Arc Quad Sets Strengthening;Right;10 reps;2 sets    Heel Slides Right;10 reps    Terminal Knee Extension Right;10 reps    Knee Extension AROM;Right    Knee Extension Limitations 7    Knee Flexion AROM;Right    Knee Flexion Limitations 117      Manual Therapy   Manual Therapy Edema management;Soft tissue mobilization    Manual therapy comments completed seperate from all other aspects of treatment    Edema Management decongestive techniques to decrease edema to improve ROM    Soft tissue mobilization to hamstring mm to relax mm for decrease pain                       PT Short Term Goals - 09/23/21 1440       PT SHORT TERM GOAL #1   Title Patient will be independent with HEP in order to improve functional outcomes.    Time 3    Period Weeks    Status On-going    Target Date 10/03/21      PT SHORT TERM GOAL #2   Title Demonstrate right knee ROM to full extension, 100 degrees flexion to enable normalized kinematics    Baseline lacking 21 degree extension, 82 flexion    Time 3    Period Weeks    Status On-going    Target Date 10/03/21               PT Long Term Goals - 09/12/21 1520       PT LONG TERM GOAL #1   Title Patient will improve FOTO score by at least 10 points in order to indicate improved tolerance to activity.    Baseline 28% function    Time 6    Period Weeks    Status New    Target Date 10/24/21      PT LONG TERM GOAL #2   Title Patient will be able to ambulate at least 250 feet in 2MWT in order to demonstrate improved gait speed for community ambulation.    Baseline 100 ft with RW    Time 6    Period Weeks    Status New    Target Date 10/24/21                   Plan - 10/05/21 1519     Clinical Impression Statement PT continues to have a high level of pain.  Treatment limited  to ROM activity and making sure that  pt does not lose ROM , manualt to decrease edema and ice for pain control.    Personal Factors and Comorbidities Age;Comorbidity 1;Time since onset of injury/illness/exacerbation    Comorbidities Tbco abuse    Examination-Activity Limitations Bed Mobility;Carry;Lift;Toileting;Stand;Stairs;Squat;Reach Overhead;Transfers;Locomotion Level    Examination-Participation Restrictions Cleaning;Community Activity;Laundry;Yard Work;Shop;Meal Prep    Stability/Clinical Decision Making Stable/Uncomplicated    Rehab Potential Good    PT Frequency 3x / week    PT Duration 6 weeks    PT Treatment/Interventions ADLs/Self Care Home Management;Gait training;Stair training;Functional mobility training;Therapeutic activities;Therapeutic exercise;Balance training;Patient/family education;Neuromuscular re-education;Manual techniques;Passive range of motion;Dry needling;Spinal Manipulations;Joint Manipulations    PT Next Visit Plan Complete 10th visit note.  Continue with TKR rehab    PT Home Exercise Plan QS, heel slides, heel propr for extensoin 10/10 seated calf stretch, HS stretch, glute set    Consulted and Agree with Plan of Care Patient             Patient will benefit from skilled therapeutic intervention in order to improve the following deficits and impairments:  Abnormal gait, Decreased activity tolerance, Decreased mobility, Decreased range of motion, Decreased strength, Difficulty walking, Increased edema, Pain  Visit Diagnosis: Difficulty in walking, not elsewhere classified  Muscle weakness (generalized)  Other abnormalities of gait and mobility     Problem List Patient Active Problem List   Diagnosis Date Noted   S/P total knee arthroplasty, 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   Diverticulosis of colon without hemorrhage    Solitary pulmonary nodule 02/10/2016   Abnormal CT scan 02/02/2016   Chronic GI bleeding 02/01/2016   Anemia due to chronic blood loss  02/01/2016   Colon polyps 01/04/2016   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   Gastric erosion with bleeding 01/02/2016   Duodenal erosion 01/02/2016   Tobacco abuse 01/01/2016   Essential hypertension 01/01/2016   Mucosal abnormality of stomach    GI bleed 12/31/2015   Diabetes mellitus without complication ([facility])    Hyperlipidemia    BPH (benign prostatic hyperplasia)    Carotid artery disease ([place])    OSA on CPAP    COPD (chronic obstructive pulmonary disease) [facility])    Todd George, PT CLT [phone]  10/05/2021, 3:21 PM  [place], [place], [address] Phone: [phone]   Fax:  [phone]  Name: Todd George MRN: [id number] Date of Birth: 11-26-1941

## 2021-10-07 ENCOUNTER — Other Ambulatory Visit: Payer: Self-pay

## 2021-10-07 ENCOUNTER — Ambulatory Visit (HOSPITAL_COMMUNITY): Payer: Medicare HMO | Admitting: Physical Therapy

## 2021-10-07 DIAGNOSIS — R262 Difficulty in walking, not elsewhere classified: Secondary | ICD-10-CM

## 2021-10-07 DIAGNOSIS — R29898 Other symptoms and signs involving the musculoskeletal system: Secondary | ICD-10-CM | POA: Diagnosis not present

## 2021-10-07 DIAGNOSIS — R2689 Other abnormalities of gait and mobility: Secondary | ICD-10-CM

## 2021-10-07 DIAGNOSIS — M6281 Muscle weakness (generalized): Secondary | ICD-10-CM

## 2021-10-07 NOTE — Therapy (Addendum)
[place], [place], [address] Phone: [phone]   Fax:  [phone]  Physical Therapy Treatment  Patient Details  Name: Todd George MRN: [id number] Date of Birth: 08-07-1941 Referring Provider (PT): Dr. Paralee Cancel Progress Note Reporting Period 09/12/21  to 1014/2022  See note below for Objective Data and Assessment of Progress/Goals.      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   BPH (benign prostatic hyperplasia)    Cancer ([facility])    skin cancer   Carotid artery disease ([facility])    Cellulitis    Chronic back pain    Colon polyps 01/04/2016   COPD (chronic obstructive pulmonary disease) ([place])    2ppd   Diabetes mellitus without complication ([place])    Duodenal erosion 01/02/2016   Gastric erosion with bleeding 01/02/2016   Hyperlipidemia    Hypertension    OSA on CPAP    not used machine in over 10 yrs    Past Surgical History:  Procedure Laterality Date   CHOLECYSTECTOMY     COLONOSCOPY N/A 01/03/2016   RMR: Active bleeding seen arising from the small bowel. Multiple cecal/ascending colon AVMs ablated. 1.5 cm carpet polyp in the ascending segment status post piecemeal snare polypectomy and tattooing. Innocent appearing colonic diverticulosis.   COLONOSCOPY N/A 06/14/2016   Procedure: COLONOSCOPY;  Surgeon: Daneil Dolin, MD;  [facility];  Service: Endoscopy;  Laterality: N/A;  1200   COLONOSCOPY WITH PROPOFOL N/A 11/28/2018   Procedure: COLONOSCOPY WITH PROPOFOL;  Surgeon: Daneil Dolin, MD;  [facility];  Service: Endoscopy;  Laterality: N/A;  8:30am   ESOPHAGOGASTRODUODENOSCOPY N/A 01/01/2016   RMR: Multiple gastric and duodenal erosions likely NSAID related.   GIVENS CAPSULE STUDY N/A 01/03/2016   Incomplete study, capsule never reach cecum. Scattered erosions and AVMs throughout the small bowel.   JOINT REPLACEMENT     POLYPECTOMY  11/28/2018   Procedure: POLYPECTOMY;  Surgeon: Daneil Dolin, MD;  [facility];  Service: Endoscopy;;  ascending colon   REPLACEMENT TOTAL KNEE Left    SHOULDER ARTHROSCOPY WITH DISTAL CLAVICLE RESECTION Right 05/28/2015   Procedure: SHOULDER ARTHROSCOPY WITH DISTAL CLAVICLE RESECTION;  Surgeon: Melrose Nakayama, MD;  Location: [facility];  Service: Orthopedics;  Laterality: Right;   SHOULDER ARTHROSCOPY WITH SUBACROMIAL DECOMPRESSION Right 05/28/2015   Procedure: SHOULDER ARTHROSCOPY WITH SUBACROMIAL DECOMPRESSION;  Surgeon: Melrose Nakayama, MD;  Location: [facility];  Service: Orthopedics;  Laterality: Right;   THROAT SURGERY  1965   TOTAL KNEE ARTHROPLASTY Right 09/06/2021   Procedure: TOTAL KNEE ARTHROPLASTY;  Surgeon: Paralee Cancel, MD;  [facility];  Service: Orthopedics;  Laterality: Right;    There were no vitals filed for this visit.  Subjective Assessment - 10/07/21 1053     Subjective Pt states that he feels weak.  Pain is lower today.    Currently in Pain? Yes    Pain Score 3     Pain Location Knee    Pain Orientation Right    Pain Descriptors / Indicators Aching    Pain Onset 1 to 4 weeks ago    Pain Frequency Intermittent    Aggravating Factors  WB    Pain Relieving Factors not sure                [facility] PT Assessment - 10/07/21 0001       Assessment   Medical Diagnosis Right TKR    Referring Provider  (PT) Dr. Paralee Cancel    Onset Date/Surgical Date 09/06/21      Home Environment   Living Environment Private residence    Living Arrangements Alone    Available Help at Discharge Neighbor    Type of [place] to enter    Home Layout One level    [place] - 2 wheels      Prior Function   Level of [place] Retired      Observation/Other Assessments   Focus on Therapeutic Outcomes (FOTO)  54 was 28.8% function      AROM   Right Knee Extension 5   was lacking 21   Right Knee Flexion 117   was 82     Strength   Right Knee Flexion 4/5   was 3/5   Right Knee Extension 4-/5   was 3-     Bed Mobility   Bed Mobility Sit to Supine    Sit to Supine Minimal Assistance - Patient > 75%      Transfers   Transfers Sit to Stand    Sit to Stand 5: Supervision      Ambulation/Gait   Ambulation Distance (Feet) 100 Feet    Gait Pattern Step-to pattern;Antalgic    Gait Comments 2MWT             measured pt for thigh high compression stockings.               [facility] Adult PT Treatment/Exercise - 10/07/21 0001       Ambulation/Gait   Ambulation/Gait Yes    Ambulation/Gait Assistance 5: Supervision    Assistive device Straight cane    Stairs Yes    Stairs Assistance 6: Modified independent (Device/Increase time)    Stair Management Technique One rail Right;Alternating pattern    Number of Stairs 10    Height of Stairs --   both 4 and 7     Exercises   Exercises Knee/Hip      Knee/Hip Exercises: Stretches   Gastroc Stretch Right;2 reps;60 seconds    Gastroc Stretch Limitations slantboard      Knee/Hip Exercises: Standing   Heel Raises 15 reps    Knee Flexion Right;15 reps    Forward Step Up Right;15 reps    Functional Squat 15 reps    SLS x 2 B; 2 finger hold for RT, 1 for LT      Knee/Hip Exercises: Seated   Sit to Sand 10 reps      Knee/Hip Exercises: Supine   Knee Extension Limitations 5     Knee Flexion Limitations 117      Manual Therapy   Manual Therapy Edema management;Soft tissue mobilization  Manual therapy comments completed seperate from all other aspects of treatment    Edema Management decongestive techniques to decrease edema to improve ROM                       PT Short Term Goals - 10/07/21 1102       PT SHORT TERM GOAL #1   Title Patient will be independent with HEP in order to improve functional outcomes.    Time 3    Period Weeks    Status Achieved    Target Date 10/03/21      PT SHORT TERM GOAL #2   Title Demonstrate right knee ROM to full extension, 100 degrees flexion to enable normalized kinematics    Baseline lacking 21 degree extension, 82 flexion    Time 3    Period Weeks    Status Partially Met    Target Date 10/03/21               PT Long Term Goals - 10/07/21 1102       PT LONG TERM GOAL #1   Title Patient will improve FOTO score by at least 10 points in order to indicate improved tolerance to activity.    Baseline 28% function    Time 6    Period Weeks    Status New      PT LONG TERM GOAL #2   Title Patient will be able to ambulate at least 250 feet in 2MWT in order to demonstrate improved gait speed for community ambulation.    Baseline 100 ft with RW    Time 6    Period Weeks    Status New               PT 10th visit completed.  Pt has improved in ROM and strength but continues to have significant pain, decreased strength and ROM/ The pt will continue to benefit from skilled PT to address these issues and improve his functioning ability.    Personal Factors and Comorbidities Age;Comorbidity 1;Time since onset of injury/illness/exacerbation      Comorbidities Tbco abuse     Examination-Activity Limitations Bed Mobility;Carry;Lift;Toileting;Stand;Stairs;Squat;Reach Overhead;Transfers;Locomotion Level     Examination-Participation Restrictions Cleaning;Community Activity;Laundry;Yard  Work;Shop;Meal Prep     Stability/Clinical Decision Making Stable/Uncomplicated     Rehab Potential Good     PT Frequency 3x / week     PT Duration 6 weeks     PT Treatment/Interventions ADLs/Self Care Home Management;Gait training;Stair training;Functional mobility training;Therapeutic activities;Therapeutic exercise;Balance training;Patient/family education;Neuromuscular re-education;Manual techniques;Passive range of motion;Dry needling;Spinal Manipulations;Joint Manipulations     PT Next Visit Plan Continue with TKR rehab     PT Home Exercise Plan QS, heel slides, heel propr for extensoin 10/10 seated calf stretch, HS stretch, glute set     Consulted and Agree with Plan of Care Patient     Patient will benefit from skilled therapeutic intervention in order to improve the following deficits and impairments:     Visit Diagnosis: Difficulty in walking, not elsewhere classified  Muscle weakness (generalized)  Other abnormalities of gait and mobility  Other symptoms and signs involving the musculoskeletal system     Problem List Patient Active Problem List   Diagnosis Date Noted   S/P total knee arthroplasty, 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   Diverticulosis of colon without hemorrhage    Solitary pulmonary nodule 02/10/2016   Abnormal CT scan 02/02/2016   Chronic GI bleeding 02/01/2016   Anemia due to chronic blood  loss 02/01/2016   Colon polyps 01/04/2016   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   Gastric erosion with bleeding 01/02/2016   Duodenal erosion 01/02/2016   Tobacco abuse 01/01/2016   Essential hypertension 01/01/2016   Mucosal abnormality of stomach    GI bleed 12/31/2015   Diabetes mellitus without complication ([facility])    Hyperlipidemia    BPH (benign prostatic hyperplasia)    Carotid artery disease ([facility])    OSA on CPAP    COPD (chronic obstructive pulmonary disease) [facility])    Rayetta Humphrey, PT CLT [phone]  10/07/2021, 11:35  AM  [place], [place], [address] Phone: [phone]   Fax:  [phone]  Name: TREVYN LUMPKIN MRN: [id number] Date of Birth: 12/18/1941

## 2021-10-10 ENCOUNTER — Encounter (HOSPITAL_COMMUNITY): Payer: Medicare HMO

## 2021-10-12 ENCOUNTER — Encounter (HOSPITAL_COMMUNITY): Payer: Medicare HMO

## 2021-10-12 ENCOUNTER — Encounter (HOSPITAL_COMMUNITY): Payer: Medicare HMO | Admitting: Physical Therapy

## 2021-10-12 ENCOUNTER — Telehealth (HOSPITAL_COMMUNITY): Payer: Self-pay

## 2021-10-12 NOTE — Telephone Encounter (Signed)
No show, called and spoke to pt.  Pt stated he felt very weak in the last 2 days and knee feels very unstable and painful.  Reminded next apt date and time.  Requested pt to call and cancel/reschedule in the future if unable to make it to apt.  Pt thought he had cancelled via the reminder phone call.    Ihor Austin, LPTA/CLT; Delana Meyer [phone]

## 2021-10-14 ENCOUNTER — Other Ambulatory Visit: Payer: Self-pay

## 2021-10-14 ENCOUNTER — Ambulatory Visit (HOSPITAL_COMMUNITY): Payer: Medicare HMO

## 2021-10-14 DIAGNOSIS — R29898 Other symptoms and signs involving the musculoskeletal system: Secondary | ICD-10-CM | POA: Diagnosis not present

## 2021-10-14 DIAGNOSIS — R262 Difficulty in walking, not elsewhere classified: Secondary | ICD-10-CM

## 2021-10-14 DIAGNOSIS — M6281 Muscle weakness (generalized): Secondary | ICD-10-CM

## 2021-10-14 DIAGNOSIS — R2689 Other abnormalities of gait and mobility: Secondary | ICD-10-CM | POA: Diagnosis not present

## 2021-10-14 NOTE — Therapy (Signed)
[place], [place], [address] Phone: [phone]   Fax:  [phone]  Physical Therapy Treatment  Patient Details  Name: Todd George MRN: [id number] Date of Birth: March 22, 1941 Referring Provider (PT): Dr. Paralee Cancel   Encounter Date: 10/14/2021   PT End of Session - 10/14/21 1451     Visit Number 11    Number of Visits 18    Date for PT Re-Evaluation 10/24/21    Authorization Type [employer]    Authorization Time Period 12 visits 9/19-10/31/22    Authorization - Visit Number 11    Authorization - Number of Visits 12    Progress Note Due on Visit 10    PT Start Time 1430    PT Stop Time 7902    PT Time Calculation (min) 45 min    Activity Tolerance Patient tolerated treatment well    Behavior During Therapy [facility] for tasks assessed/performed             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   BPH (benign prostatic hyperplasia)    Cancer ([place])    skin cancer   Carotid artery disease ([place])    Cellulitis    Chronic back pain    Colon polyps 01/04/2016   COPD (chronic obstructive pulmonary disease) ([place])    2ppd   Diabetes mellitus without complication ([place])    Duodenal erosion 01/02/2016   Gastric erosion with bleeding 01/02/2016   Hyperlipidemia    Hypertension    OSA on CPAP    not used machine in over 10 yrs    Past Surgical History:  Procedure Laterality Date   CHOLECYSTECTOMY     COLONOSCOPY N/A 01/03/2016   RMR: Active bleeding seen arising from the small bowel. Multiple cecal/ascending colon AVMs ablated. 1.5 cm carpet polyp in the ascending segment status post piecemeal snare polypectomy and tattooing. Innocent appearing colonic diverticulosis.   COLONOSCOPY N/A 06/14/2016   Procedure: COLONOSCOPY;  Surgeon: Daneil Dolin, MD;  [facility];  Service: Endoscopy;  Laterality: N/A;  1200    COLONOSCOPY WITH PROPOFOL N/A 11/28/2018   Procedure: COLONOSCOPY WITH PROPOFOL;  Surgeon: Daneil Dolin, MD;  [facility];  Service: Endoscopy;  Laterality: N/A;  8:30am   ESOPHAGOGASTRODUODENOSCOPY N/A 01/01/2016   RMR: Multiple gastric and duodenal erosions likely NSAID related.   GIVENS CAPSULE STUDY N/A 01/03/2016   Incomplete study, capsule never reach cecum. Scattered erosions and AVMs throughout the small bowel.   JOINT REPLACEMENT     POLYPECTOMY  11/28/2018   Procedure: POLYPECTOMY;  Surgeon: Daneil Dolin, MD;  [facility];  Service: Endoscopy;;  ascending colon   REPLACEMENT TOTAL KNEE Left    SHOULDER ARTHROSCOPY WITH DISTAL CLAVICLE RESECTION Right 05/28/2015   Procedure: SHOULDER ARTHROSCOPY WITH DISTAL CLAVICLE RESECTION;  Surgeon: Melrose Nakayama, MD;  Location: [place];  Service: Orthopedics;  Laterality: Right;   SHOULDER ARTHROSCOPY WITH SUBACROMIAL DECOMPRESSION Right 05/28/2015   Procedure: SHOULDER ARTHROSCOPY WITH SUBACROMIAL DECOMPRESSION;  Surgeon: Melrose Nakayama, MD;  Location: [place];  Service: Orthopedics;  Laterality: Right;   THROAT SURGERY  1965   TOTAL KNEE ARTHROPLASTY Right 09/06/2021   Procedure: TOTAL KNEE ARTHROPLASTY;  Surgeon: Paralee Cancel, MD;  Location: [facility];  Service: Orthopedics;  Laterality: Right;    There were no vitals filed for this visit.  Subjective Assessment - 10/14/21 1436     Subjective Continuing to have some difficulties with the LLE. Noticed redness and swelling in the left leg, no weeping or drainage from the LE. Notes pain along the medial and lateral side of knee extending up the thigh towards the groin. Denies feeling feverish, chills, no N&V.  Feels like his knee came close to buckling a couple of times. Sharp and fleeting leg pains.    Currently in Pain? Yes    Pain Score 3     Pain Location Knee    Pain Orientation Right    Pain Type Acute pain    Pain Onset 1 to 4  weeks ago                [facility] PT Assessment - 10/14/21 0001       Assessment   Medical Diagnosis Right TKR    Referring Provider (PT) Dr. Paralee Cancel    Onset Date/Surgical Date 09/06/21    Next MD Visit 10/20/21                           [facility] Adult PT Treatment/Exercise - 10/14/21 0001       Knee/Hip Exercises: Supine   Quad Sets Strengthening;Right;3 sets;10 reps    Short Arc Quad Sets Strengthening;Both;3 sets;15 reps    Short Arc Quad Sets Limitations 3#    Knee Extension AROM;Right    Knee Extension Limitations 5   lacking   Knee Flexion AROM;Right    Other Supine Knee/Hip Exercises ball squeeze 3x10      Manual Therapy   Manual Therapy Soft tissue mobilization    Manual therapy comments completed seperate from all other aspects of treatment    Edema Management STM to thigh adductors/ITB and joint oscillations to reduce tension/guarding to increase knee extension                       PT Short Term Goals - 10/07/21 1102       PT SHORT TERM GOAL #1   Title Patient will be independent with HEP in order to improve functional outcomes.    Time 3    Period Weeks    Status Achieved    Target Date 10/03/21      PT SHORT TERM GOAL #2   Title Demonstrate right knee ROM to full extension, 100 degrees flexion to enable normalized kinematics    Baseline lacking 21 degree extension, 82 flexion    Time 3    Period Weeks    Status Partially Met    Target Date 10/03/21               PT Long Term Goals - 10/07/21 1102       PT LONG TERM GOAL #1   Title Patient will improve FOTO score by at least 10 points in order to indicate improved tolerance to activity.    Baseline 28% function    Time 6    Period Weeks    Status New      PT LONG TERM GOAL #2   Title Patient will be able to ambulate at least 250 feet in 2MWT in order to demonstrate improved gait speed for community ambulation.    Baseline 100 ft with RW    Time 6     Period Weeks    Status New  Plan - 10/14/21 1440     Clinical Impression Statement No pain with passive movement of right knee, no pain provocation with extension or flexion muscle tests. Obvious asymmetry with sit to stand transfers offloading his RLE with push-off for sit to stand. Continues to lack right knee extension and does not tolerate static or active stretches for knee extension very well.  Right quad atrophy is quite evident. Emphasized importance of terminal knee extension for kinematics and strength/stability for knee    Personal Factors and Comorbidities Age;Comorbidity 1;Time since onset of injury/illness/exacerbation    Comorbidities Tbco abuse    Examination-Activity Limitations Bed Mobility;Carry;Lift;Toileting;Stand;Stairs;Squat;Reach Overhead;Transfers;Locomotion Level    Examination-Participation Restrictions Cleaning;Community Activity;Laundry;Yard Work;Shop;Meal Prep    Stability/Clinical Decision Making Stable/Uncomplicated    Rehab Potential Good    PT Frequency 3x / week    PT Duration 6 weeks    PT Treatment/Interventions ADLs/Self Care Home Management;Gait training;Stair training;Functional mobility training;Therapeutic activities;Therapeutic exercise;Balance training;Patient/family education;Neuromuscular re-education;Manual techniques;Passive range of motion;Dry needling;Spinal Manipulations;Joint Manipulations    PT Next Visit Plan Continue with TKR rehab    PT Home Exercise Plan QS, heel slides, heel propr for extensoin 10/10 seated calf stretch, HS stretch, glute set    Consulted and Agree with Plan of Care Patient             Patient will benefit from skilled therapeutic intervention in order to improve the following deficits and impairments:  Abnormal gait, Decreased activity tolerance, Decreased mobility, Decreased range of motion, Decreased strength, Difficulty walking, Increased edema, Pain  Visit Diagnosis: Difficulty in  walking, not elsewhere classified  Muscle weakness (generalized)  Other abnormalities of gait and mobility  Other symptoms and signs involving the musculoskeletal system     Problem List Patient Active Problem List   Diagnosis Date Noted   S/P total knee arthroplasty, 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   Diverticulosis of colon without hemorrhage    Solitary pulmonary nodule 02/10/2016   Abnormal CT scan 02/02/2016   Chronic GI bleeding 02/01/2016   Anemia due to chronic blood loss 02/01/2016   Colon polyps 01/04/2016   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

## 2021-10-20 DIAGNOSIS — Z96651 Presence of right artificial knee joint: Secondary | ICD-10-CM | POA: Diagnosis not present

## 2021-10-20 DIAGNOSIS — Z471 Aftercare following joint replacement surgery: Secondary | ICD-10-CM | POA: Diagnosis not present

## 2021-10-27 DIAGNOSIS — H43813 Vitreous degeneration, bilateral: Secondary | ICD-10-CM | POA: Diagnosis not present

## 2021-10-27 DIAGNOSIS — H43822 Vitreomacular adhesion, left eye: Secondary | ICD-10-CM | POA: Diagnosis not present

## 2021-10-27 DIAGNOSIS — H353231 Exudative age-related macular degeneration, bilateral, with active choroidal neovascularization: Secondary | ICD-10-CM | POA: Diagnosis not present

## 2021-10-27 DIAGNOSIS — H35371 Puckering of macula, right eye: Secondary | ICD-10-CM | POA: Diagnosis not present

## 2021-11-10 DIAGNOSIS — K5900 Constipation, unspecified: Secondary | ICD-10-CM | POA: Diagnosis not present

## 2021-11-10 DIAGNOSIS — J069 Acute upper respiratory infection, unspecified: Secondary | ICD-10-CM | POA: Diagnosis not present

## 2021-11-18 ENCOUNTER — Emergency Department (HOSPITAL_COMMUNITY)
Admission: EM | Admit: 2021-11-18 | Discharge: 2021-11-18 | Disposition: A | Discharge status: Home / Self Care | Payer: Medicare HMO | Attending: Emergency Medicine | Admitting: Emergency Medicine

## 2021-11-18 ENCOUNTER — Encounter (HOSPITAL_COMMUNITY): Payer: Self-pay

## 2021-11-18 ENCOUNTER — Other Ambulatory Visit: Payer: Self-pay

## 2021-11-18 DIAGNOSIS — H5712 Ocular pain, left eye: Secondary | ICD-10-CM | POA: Diagnosis not present

## 2021-11-18 DIAGNOSIS — T1502XA Foreign body in cornea, left eye, initial encounter: Secondary | ICD-10-CM | POA: Diagnosis not present

## 2021-11-18 DIAGNOSIS — Z5321 Procedure and treatment not carried out due to patient leaving prior to being seen by health care provider: Secondary | ICD-10-CM | POA: Insufficient documentation

## 2021-11-18 MED ORDER — FLUORESCEIN SODIUM 1 MG OP STRP
1.0000 | ORAL_STRIP | Freq: Once | OPHTHALMIC | Status: DC
  Filled 2021-11-18: qty 1

## 2021-11-18 MED ORDER — TETRACAINE HCL 0.5 % OP SOLN
1.0000 [drp] | Freq: Once | OPHTHALMIC | Status: AC
  Administered 2021-11-18: 1 [drp] via OPHTHALMIC
  Filled 2021-11-18: qty 4

## 2021-11-18 NOTE — ED Notes (Signed)
Patient had stated that he did not have any pain any longer in his left eye, prior to instillation of lidocaine drop.  Patient left while I was seeing another patient and advised registration staff that he did not want to wait any longer.

## 2021-11-18 NOTE — ED Provider Notes (Signed)
Emergency Medicine Provider Triage Evaluation Note  DATRELL DUNTON , a 80 y.o. male  was evaluated in triage.  Pt complains of foreign body sensation left eye upon waking today. Denies vision changes, has had clear tearing.  Review of Systems  Positive: Eye pain Negative: Vision changes  Physical Exam  BP (!) 160/65 (BP Location: Right Arm)   Pulse 66   Temp 98.4 F (36.9 C) (Oral)   Resp 18   Ht 5\' 7"  (1.702 m)   Wt 86.6 kg   SpO2 95%   BMI 29.91 kg/m  Gen:   Awake, no distress   Resp:  Normal effort  MSK:   Moves extremities without difficulty  Other:    Medical Decision Making  Medically screening exam initiated at 11:49 AM.  Appropriate orders placed.  Leana Gamer was informed that the remainder of the evaluation will be completed by another provider, this initial triage assessment does not replace that evaluation, and the importance of remaining in the ED until their evaluation is complete.  Pt will need fluorescein exam to assess for fb/abrasion.  Pending room placement. Orders placed.   Evalee Jefferson, PA-C 11/18/21 1151    Daleen Bo, MD 11/19/21 [phone]

## 2021-11-18 NOTE — ED Triage Notes (Signed)
Pt presents to ED and states he feels like something is in his left eye, started this morning. Pt denies any problems with his vision.

## 2021-11-24 DIAGNOSIS — E782 Mixed hyperlipidemia: Secondary | ICD-10-CM | POA: Diagnosis not present

## 2021-11-24 DIAGNOSIS — E1169 Type 2 diabetes mellitus with other specified complication: Secondary | ICD-10-CM | POA: Diagnosis not present

## 2021-11-30 DIAGNOSIS — E782 Mixed hyperlipidemia: Secondary | ICD-10-CM | POA: Diagnosis not present

## 2021-11-30 DIAGNOSIS — G2581 Restless legs syndrome: Secondary | ICD-10-CM | POA: Diagnosis not present

## 2021-11-30 DIAGNOSIS — E1169 Type 2 diabetes mellitus with other specified complication: Secondary | ICD-10-CM | POA: Diagnosis not present

## 2021-11-30 DIAGNOSIS — J449 Chronic obstructive pulmonary disease, unspecified: Secondary | ICD-10-CM | POA: Diagnosis not present

## 2021-11-30 DIAGNOSIS — R944 Abnormal results of kidney function studies: Secondary | ICD-10-CM | POA: Diagnosis not present

## 2021-11-30 DIAGNOSIS — F17200 Nicotine dependence, unspecified, uncomplicated: Secondary | ICD-10-CM | POA: Diagnosis not present

## 2021-11-30 DIAGNOSIS — Z0001 Encounter for general adult medical examination with abnormal findings: Secondary | ICD-10-CM | POA: Diagnosis not present

## 2021-11-30 DIAGNOSIS — R011 Cardiac murmur, unspecified: Secondary | ICD-10-CM | POA: Diagnosis not present

## 2021-11-30 DIAGNOSIS — I10 Essential (primary) hypertension: Secondary | ICD-10-CM | POA: Diagnosis not present

## 2021-12-05 DIAGNOSIS — H353231 Exudative age-related macular degeneration, bilateral, with active choroidal neovascularization: Secondary | ICD-10-CM | POA: Diagnosis not present

## 2021-12-05 DIAGNOSIS — H353211 Exudative age-related macular degeneration, right eye, with active choroidal neovascularization: Secondary | ICD-10-CM | POA: Diagnosis not present

## 2021-12-05 DIAGNOSIS — H353221 Exudative age-related macular degeneration, left eye, with active choroidal neovascularization: Secondary | ICD-10-CM | POA: Diagnosis not present

## 2021-12-05 DIAGNOSIS — R6889 Other general symptoms and signs: Secondary | ICD-10-CM | POA: Diagnosis not present

## 2021-12-20 ENCOUNTER — Encounter (HOSPITAL_COMMUNITY): Payer: Self-pay

## 2021-12-20 NOTE — Therapy (Signed)
[place] °[facility] °[address] °[place], [place], [address] °Phone: [phone]   Fax:  [phone] ° °Patient Details  °Name: Todd George °MRN: [id number] °Date of Birth: 06/29/1941 °Referring Provider:  No ref. provider found ° °Encounter Date: 12/20/2021 °PHYSICAL THERAPY DISCHARGE SUMMARY ° °Visits from Start of Care: 11 ° °Current functional level related to goals / functional outcomes: °Progressing with STG/LTG °  °Remaining deficits: °Continued deficits with involved LE at time of last session °  °Education / Equipment: °HEP initiated  ° °Patient agrees to discharge. Patient goals were partially met. Patient is being discharged due to not returning since the last visit. ° ° ° K , PT °12/20/2021, 11:43 AM ° °[place] °[facility] °[address] °[place], [place], [address] °Phone: [phone]   Fax:  [phone] °

## 2022-01-12 DIAGNOSIS — H43813 Vitreous degeneration, bilateral: Secondary | ICD-10-CM | POA: Diagnosis not present

## 2022-01-12 DIAGNOSIS — H353231 Exudative age-related macular degeneration, bilateral, with active choroidal neovascularization: Secondary | ICD-10-CM | POA: Diagnosis not present

## 2022-01-12 DIAGNOSIS — H43822 Vitreomacular adhesion, left eye: Secondary | ICD-10-CM | POA: Diagnosis not present

## 2022-01-12 DIAGNOSIS — H35371 Puckering of macula, right eye: Secondary | ICD-10-CM | POA: Diagnosis not present

## 2022-01-24 DIAGNOSIS — I10 Essential (primary) hypertension: Secondary | ICD-10-CM | POA: Diagnosis not present

## 2022-01-24 DIAGNOSIS — E782 Mixed hyperlipidemia: Secondary | ICD-10-CM | POA: Diagnosis not present

## 2022-01-31 DIAGNOSIS — R011 Cardiac murmur, unspecified: Secondary | ICD-10-CM | POA: Diagnosis not present

## 2022-01-31 DIAGNOSIS — E1169 Type 2 diabetes mellitus with other specified complication: Secondary | ICD-10-CM | POA: Diagnosis not present

## 2022-01-31 DIAGNOSIS — F17200 Nicotine dependence, unspecified, uncomplicated: Secondary | ICD-10-CM | POA: Diagnosis not present

## 2022-01-31 DIAGNOSIS — M1711 Unilateral primary osteoarthritis, right knee: Secondary | ICD-10-CM | POA: Diagnosis not present

## 2022-01-31 DIAGNOSIS — J449 Chronic obstructive pulmonary disease, unspecified: Secondary | ICD-10-CM | POA: Diagnosis not present

## 2022-01-31 DIAGNOSIS — R944 Abnormal results of kidney function studies: Secondary | ICD-10-CM | POA: Diagnosis not present

## 2022-01-31 DIAGNOSIS — G2581 Restless legs syndrome: Secondary | ICD-10-CM | POA: Diagnosis not present

## 2022-01-31 DIAGNOSIS — E782 Mixed hyperlipidemia: Secondary | ICD-10-CM | POA: Diagnosis not present

## 2022-01-31 DIAGNOSIS — I10 Essential (primary) hypertension: Secondary | ICD-10-CM | POA: Diagnosis not present

## 2022-01-31 DIAGNOSIS — D649 Anemia, unspecified: Secondary | ICD-10-CM | POA: Diagnosis not present

## 2022-02-16 DIAGNOSIS — H353231 Exudative age-related macular degeneration, bilateral, with active choroidal neovascularization: Secondary | ICD-10-CM | POA: Diagnosis not present

## 2022-02-16 DIAGNOSIS — H43822 Vitreomacular adhesion, left eye: Secondary | ICD-10-CM | POA: Diagnosis not present

## 2022-02-16 DIAGNOSIS — H43813 Vitreous degeneration, bilateral: Secondary | ICD-10-CM | POA: Diagnosis not present

## 2022-02-16 DIAGNOSIS — H35371 Puckering of macula, right eye: Secondary | ICD-10-CM | POA: Diagnosis not present

## 2022-03-07 ENCOUNTER — Ambulatory Visit: Payer: Medicare HMO | Admitting: Cardiology

## 2022-03-23 DIAGNOSIS — H353211 Exudative age-related macular degeneration, right eye, with active choroidal neovascularization: Secondary | ICD-10-CM | POA: Diagnosis not present

## 2022-03-23 DIAGNOSIS — H353231 Exudative age-related macular degeneration, bilateral, with active choroidal neovascularization: Secondary | ICD-10-CM | POA: Diagnosis not present

## 2022-04-10 DIAGNOSIS — E782 Mixed hyperlipidemia: Secondary | ICD-10-CM | POA: Diagnosis not present

## 2022-04-10 DIAGNOSIS — E1169 Type 2 diabetes mellitus with other specified complication: Secondary | ICD-10-CM | POA: Diagnosis not present

## 2022-04-10 DIAGNOSIS — D649 Anemia, unspecified: Secondary | ICD-10-CM | POA: Diagnosis not present

## 2022-04-17 DIAGNOSIS — F17200 Nicotine dependence, unspecified, uncomplicated: Secondary | ICD-10-CM | POA: Diagnosis not present

## 2022-04-17 DIAGNOSIS — I10 Essential (primary) hypertension: Secondary | ICD-10-CM | POA: Diagnosis not present

## 2022-04-17 DIAGNOSIS — E782 Mixed hyperlipidemia: Secondary | ICD-10-CM | POA: Diagnosis not present

## 2022-04-17 DIAGNOSIS — R944 Abnormal results of kidney function studies: Secondary | ICD-10-CM | POA: Diagnosis not present

## 2022-04-17 DIAGNOSIS — R011 Cardiac murmur, unspecified: Secondary | ICD-10-CM | POA: Diagnosis not present

## 2022-04-17 DIAGNOSIS — E1169 Type 2 diabetes mellitus with other specified complication: Secondary | ICD-10-CM | POA: Diagnosis not present

## 2022-04-17 DIAGNOSIS — J449 Chronic obstructive pulmonary disease, unspecified: Secondary | ICD-10-CM | POA: Diagnosis not present

## 2022-04-17 DIAGNOSIS — G2581 Restless legs syndrome: Secondary | ICD-10-CM | POA: Diagnosis not present

## 2022-04-17 DIAGNOSIS — M1711 Unilateral primary osteoarthritis, right knee: Secondary | ICD-10-CM | POA: Diagnosis not present

## 2022-04-27 DIAGNOSIS — H43813 Vitreous degeneration, bilateral: Secondary | ICD-10-CM | POA: Diagnosis not present

## 2022-04-27 DIAGNOSIS — R6889 Other general symptoms and signs: Secondary | ICD-10-CM | POA: Diagnosis not present

## 2022-04-27 DIAGNOSIS — H35371 Puckering of macula, right eye: Secondary | ICD-10-CM | POA: Diagnosis not present

## 2022-04-27 DIAGNOSIS — H43822 Vitreomacular adhesion, left eye: Secondary | ICD-10-CM | POA: Diagnosis not present

## 2022-04-27 DIAGNOSIS — H353231 Exudative age-related macular degeneration, bilateral, with active choroidal neovascularization: Secondary | ICD-10-CM | POA: Diagnosis not present

## 2022-05-11 DIAGNOSIS — D225 Melanocytic nevi of trunk: Secondary | ICD-10-CM | POA: Diagnosis not present

## 2022-05-11 DIAGNOSIS — L820 Inflamed seborrheic keratosis: Secondary | ICD-10-CM | POA: Diagnosis not present

## 2022-05-11 DIAGNOSIS — L648 Other androgenic alopecia: Secondary | ICD-10-CM | POA: Diagnosis not present

## 2022-06-01 DIAGNOSIS — H353231 Exudative age-related macular degeneration, bilateral, with active choroidal neovascularization: Secondary | ICD-10-CM | POA: Diagnosis not present

## 2022-06-14 DIAGNOSIS — M65331 Trigger finger, right middle finger: Secondary | ICD-10-CM | POA: Diagnosis not present

## 2022-06-14 DIAGNOSIS — M65321 Trigger finger, right index finger: Secondary | ICD-10-CM | POA: Diagnosis not present

## 2022-06-23 DIAGNOSIS — E782 Mixed hyperlipidemia: Secondary | ICD-10-CM | POA: Diagnosis not present

## 2022-06-23 DIAGNOSIS — I10 Essential (primary) hypertension: Secondary | ICD-10-CM | POA: Diagnosis not present

## 2022-06-23 DIAGNOSIS — J449 Chronic obstructive pulmonary disease, unspecified: Secondary | ICD-10-CM | POA: Diagnosis not present

## 2022-06-23 DIAGNOSIS — E1169 Type 2 diabetes mellitus with other specified complication: Secondary | ICD-10-CM | POA: Diagnosis not present

## 2022-07-11 DIAGNOSIS — I10 Essential (primary) hypertension: Secondary | ICD-10-CM | POA: Diagnosis not present

## 2022-07-11 DIAGNOSIS — E1169 Type 2 diabetes mellitus with other specified complication: Secondary | ICD-10-CM | POA: Diagnosis not present

## 2022-07-11 DIAGNOSIS — Z125 Encounter for screening for malignant neoplasm of prostate: Secondary | ICD-10-CM | POA: Diagnosis not present

## 2022-07-17 DIAGNOSIS — H35371 Puckering of macula, right eye: Secondary | ICD-10-CM | POA: Diagnosis not present

## 2022-07-17 DIAGNOSIS — H353211 Exudative age-related macular degeneration, right eye, with active choroidal neovascularization: Secondary | ICD-10-CM | POA: Diagnosis not present

## 2022-07-17 DIAGNOSIS — H353231 Exudative age-related macular degeneration, bilateral, with active choroidal neovascularization: Secondary | ICD-10-CM | POA: Diagnosis not present

## 2022-07-17 DIAGNOSIS — H43813 Vitreous degeneration, bilateral: Secondary | ICD-10-CM | POA: Diagnosis not present

## 2022-07-17 DIAGNOSIS — H33012 Retinal detachment with single break, left eye: Secondary | ICD-10-CM | POA: Diagnosis not present

## 2022-07-17 DIAGNOSIS — H35423 Microcystoid degeneration of retina, bilateral: Secondary | ICD-10-CM | POA: Diagnosis not present

## 2022-07-18 DIAGNOSIS — E1169 Type 2 diabetes mellitus with other specified complication: Secondary | ICD-10-CM | POA: Diagnosis not present

## 2022-07-18 DIAGNOSIS — M1711 Unilateral primary osteoarthritis, right knee: Secondary | ICD-10-CM | POA: Diagnosis not present

## 2022-07-18 DIAGNOSIS — E782 Mixed hyperlipidemia: Secondary | ICD-10-CM | POA: Diagnosis not present

## 2022-07-18 DIAGNOSIS — R944 Abnormal results of kidney function studies: Secondary | ICD-10-CM | POA: Diagnosis not present

## 2022-07-18 DIAGNOSIS — R011 Cardiac murmur, unspecified: Secondary | ICD-10-CM | POA: Diagnosis not present

## 2022-07-18 DIAGNOSIS — F17200 Nicotine dependence, unspecified, uncomplicated: Secondary | ICD-10-CM | POA: Diagnosis not present

## 2022-07-18 DIAGNOSIS — J449 Chronic obstructive pulmonary disease, unspecified: Secondary | ICD-10-CM | POA: Diagnosis not present

## 2022-07-18 DIAGNOSIS — I10 Essential (primary) hypertension: Secondary | ICD-10-CM | POA: Diagnosis not present

## 2022-07-18 DIAGNOSIS — G2581 Restless legs syndrome: Secondary | ICD-10-CM | POA: Diagnosis not present

## 2022-07-19 DIAGNOSIS — H33012 Retinal detachment with single break, left eye: Secondary | ICD-10-CM | POA: Diagnosis not present

## 2022-07-20 DIAGNOSIS — H33012 Retinal detachment with single break, left eye: Secondary | ICD-10-CM | POA: Diagnosis not present

## 2022-07-27 DIAGNOSIS — H43391 Other vitreous opacities, right eye: Secondary | ICD-10-CM | POA: Diagnosis not present

## 2022-07-27 DIAGNOSIS — H353231 Exudative age-related macular degeneration, bilateral, with active choroidal neovascularization: Secondary | ICD-10-CM | POA: Diagnosis not present

## 2022-07-27 DIAGNOSIS — H33012 Retinal detachment with single break, left eye: Secondary | ICD-10-CM | POA: Diagnosis not present

## 2022-07-31 DIAGNOSIS — H33012 Retinal detachment with single break, left eye: Secondary | ICD-10-CM | POA: Diagnosis not present

## 2022-07-31 DIAGNOSIS — E119 Type 2 diabetes mellitus without complications: Secondary | ICD-10-CM | POA: Diagnosis not present

## 2022-07-31 DIAGNOSIS — F17200 Nicotine dependence, unspecified, uncomplicated: Secondary | ICD-10-CM | POA: Diagnosis not present

## 2022-07-31 DIAGNOSIS — G629 Polyneuropathy, unspecified: Secondary | ICD-10-CM | POA: Diagnosis not present

## 2022-07-31 DIAGNOSIS — H3322 Serous retinal detachment, left eye: Secondary | ICD-10-CM | POA: Diagnosis not present

## 2022-07-31 DIAGNOSIS — R6889 Other general symptoms and signs: Secondary | ICD-10-CM | POA: Diagnosis not present

## 2022-07-31 DIAGNOSIS — E113512 Type 2 diabetes mellitus with proliferative diabetic retinopathy with macular edema, left eye: Secondary | ICD-10-CM | POA: Diagnosis not present

## 2022-07-31 DIAGNOSIS — N400 Enlarged prostate without lower urinary tract symptoms: Secondary | ICD-10-CM | POA: Diagnosis not present

## 2022-07-31 DIAGNOSIS — I10 Essential (primary) hypertension: Secondary | ICD-10-CM | POA: Diagnosis not present

## 2022-07-31 DIAGNOSIS — E7800 Pure hypercholesterolemia, unspecified: Secondary | ICD-10-CM | POA: Diagnosis not present

## 2022-07-31 DIAGNOSIS — H353221 Exudative age-related macular degeneration, left eye, with active choroidal neovascularization: Secondary | ICD-10-CM | POA: Diagnosis not present

## 2022-07-31 DIAGNOSIS — H33022 Retinal detachment with multiple breaks, left eye: Secondary | ICD-10-CM | POA: Diagnosis not present

## 2022-07-31 DIAGNOSIS — E1139 Type 2 diabetes mellitus with other diabetic ophthalmic complication: Secondary | ICD-10-CM | POA: Diagnosis not present

## 2022-07-31 DIAGNOSIS — H3342 Traction detachment of retina, left eye: Secondary | ICD-10-CM | POA: Diagnosis not present

## 2022-07-31 DIAGNOSIS — H3530 Unspecified macular degeneration: Secondary | ICD-10-CM | POA: Diagnosis not present

## 2022-07-31 DIAGNOSIS — M419 Scoliosis, unspecified: Secondary | ICD-10-CM | POA: Diagnosis not present

## 2022-07-31 DIAGNOSIS — K219 Gastro-esophageal reflux disease without esophagitis: Secondary | ICD-10-CM | POA: Diagnosis not present

## 2022-08-01 DIAGNOSIS — R6889 Other general symptoms and signs: Secondary | ICD-10-CM | POA: Diagnosis not present

## 2022-08-10 DIAGNOSIS — H353231 Exudative age-related macular degeneration, bilateral, with active choroidal neovascularization: Secondary | ICD-10-CM | POA: Diagnosis not present

## 2022-08-24 DIAGNOSIS — H353231 Exudative age-related macular degeneration, bilateral, with active choroidal neovascularization: Secondary | ICD-10-CM | POA: Diagnosis not present

## 2022-09-27 DIAGNOSIS — T85398A Other mechanical complication of other ocular prosthetic devices, implants and grafts, initial encounter: Secondary | ICD-10-CM | POA: Diagnosis not present

## 2022-09-27 DIAGNOSIS — H35371 Puckering of macula, right eye: Secondary | ICD-10-CM | POA: Diagnosis not present

## 2022-09-27 DIAGNOSIS — H353231 Exudative age-related macular degeneration, bilateral, with active choroidal neovascularization: Secondary | ICD-10-CM | POA: Diagnosis not present

## 2022-09-27 DIAGNOSIS — H31092 Other chorioretinal scars, left eye: Secondary | ICD-10-CM | POA: Diagnosis not present

## 2022-09-28 DIAGNOSIS — Z96653 Presence of artificial knee joint, bilateral: Secondary | ICD-10-CM | POA: Diagnosis not present

## 2022-09-28 DIAGNOSIS — M1712 Unilateral primary osteoarthritis, left knee: Secondary | ICD-10-CM | POA: Diagnosis not present

## 2022-09-28 DIAGNOSIS — M1711 Unilateral primary osteoarthritis, right knee: Secondary | ICD-10-CM | POA: Diagnosis not present

## 2022-10-04 DIAGNOSIS — M65331 Trigger finger, right middle finger: Secondary | ICD-10-CM | POA: Diagnosis not present

## 2022-10-04 DIAGNOSIS — M1811 Unilateral primary osteoarthritis of first carpometacarpal joint, right hand: Secondary | ICD-10-CM | POA: Diagnosis not present

## 2022-10-04 DIAGNOSIS — M65321 Trigger finger, right index finger: Secondary | ICD-10-CM | POA: Diagnosis not present

## 2022-10-04 DIAGNOSIS — M79641 Pain in right hand: Secondary | ICD-10-CM | POA: Diagnosis not present

## 2022-10-11 DIAGNOSIS — H3522 Other non-diabetic proliferative retinopathy, left eye: Secondary | ICD-10-CM | POA: Diagnosis not present

## 2022-10-11 DIAGNOSIS — Z48810 Encounter for surgical aftercare following surgery on the sense organs: Secondary | ICD-10-CM | POA: Diagnosis not present

## 2022-10-11 DIAGNOSIS — H3342 Traction detachment of retina, left eye: Secondary | ICD-10-CM | POA: Diagnosis not present

## 2022-10-11 DIAGNOSIS — H3521 Other non-diabetic proliferative retinopathy, right eye: Secondary | ICD-10-CM | POA: Diagnosis not present

## 2022-10-11 DIAGNOSIS — Z8669 Personal history of other diseases of the nervous system and sense organs: Secondary | ICD-10-CM | POA: Diagnosis not present

## 2022-10-20 DIAGNOSIS — I10 Essential (primary) hypertension: Secondary | ICD-10-CM | POA: Diagnosis not present

## 2022-10-20 DIAGNOSIS — Z23 Encounter for immunization: Secondary | ICD-10-CM | POA: Diagnosis not present

## 2022-10-20 DIAGNOSIS — E1169 Type 2 diabetes mellitus with other specified complication: Secondary | ICD-10-CM | POA: Diagnosis not present

## 2022-10-20 DIAGNOSIS — Z125 Encounter for screening for malignant neoplasm of prostate: Secondary | ICD-10-CM | POA: Diagnosis not present

## 2022-10-24 ENCOUNTER — Other Ambulatory Visit (HOSPITAL_COMMUNITY): Payer: Self-pay | Admitting: Family Medicine

## 2022-10-24 ENCOUNTER — Other Ambulatory Visit: Payer: Self-pay | Admitting: Family Medicine

## 2022-10-24 DIAGNOSIS — M1711 Unilateral primary osteoarthritis, right knee: Secondary | ICD-10-CM | POA: Diagnosis not present

## 2022-10-24 DIAGNOSIS — R944 Abnormal results of kidney function studies: Secondary | ICD-10-CM | POA: Diagnosis not present

## 2022-10-24 DIAGNOSIS — R0989 Other specified symptoms and signs involving the circulatory and respiratory systems: Secondary | ICD-10-CM

## 2022-10-24 DIAGNOSIS — I10 Essential (primary) hypertension: Secondary | ICD-10-CM | POA: Diagnosis not present

## 2022-10-24 DIAGNOSIS — G2581 Restless legs syndrome: Secondary | ICD-10-CM | POA: Diagnosis not present

## 2022-10-24 DIAGNOSIS — N1831 Chronic kidney disease, stage 3a: Secondary | ICD-10-CM | POA: Diagnosis not present

## 2022-10-24 DIAGNOSIS — E1169 Type 2 diabetes mellitus with other specified complication: Secondary | ICD-10-CM | POA: Diagnosis not present

## 2022-10-24 DIAGNOSIS — J449 Chronic obstructive pulmonary disease, unspecified: Secondary | ICD-10-CM | POA: Diagnosis not present

## 2022-10-24 DIAGNOSIS — E782 Mixed hyperlipidemia: Secondary | ICD-10-CM | POA: Diagnosis not present

## 2022-10-24 DIAGNOSIS — R011 Cardiac murmur, unspecified: Secondary | ICD-10-CM | POA: Diagnosis not present

## 2022-10-24 DIAGNOSIS — F17200 Nicotine dependence, unspecified, uncomplicated: Secondary | ICD-10-CM | POA: Diagnosis not present

## 2022-11-02 DIAGNOSIS — T85398A Other mechanical complication of other ocular prosthetic devices, implants and grafts, initial encounter: Secondary | ICD-10-CM | POA: Diagnosis not present

## 2022-11-02 DIAGNOSIS — H35371 Puckering of macula, right eye: Secondary | ICD-10-CM | POA: Diagnosis not present

## 2022-11-02 DIAGNOSIS — H31092 Other chorioretinal scars, left eye: Secondary | ICD-10-CM | POA: Diagnosis not present

## 2022-11-02 DIAGNOSIS — H353231 Exudative age-related macular degeneration, bilateral, with active choroidal neovascularization: Secondary | ICD-10-CM | POA: Diagnosis not present

## 2022-11-03 ENCOUNTER — Ambulatory Visit (HOSPITAL_COMMUNITY)
Admission: RE | Admit: 2022-11-03 | Discharge: 2022-11-03 | Disposition: A | Discharge status: Home / Self Care | Payer: Medicare HMO | Source: Ambulatory Visit | Attending: Family Medicine | Admitting: Family Medicine

## 2022-11-03 DIAGNOSIS — R0989 Other specified symptoms and signs involving the circulatory and respiratory systems: Secondary | ICD-10-CM

## 2022-11-03 DIAGNOSIS — I6523 Occlusion and stenosis of bilateral carotid arteries: Secondary | ICD-10-CM | POA: Diagnosis not present

## 2022-11-22 ENCOUNTER — Ambulatory Visit (INDEPENDENT_AMBULATORY_CARE_PROVIDER_SITE_OTHER): Payer: Medicare HMO | Admitting: Vascular Surgery

## 2022-11-22 ENCOUNTER — Encounter: Payer: Self-pay | Admitting: Vascular Surgery

## 2022-11-22 VITALS — BP 167/71 | HR 60 | Temp 99.3°F | Ht 67.0 in | Wt 184.6 lb

## 2022-11-22 DIAGNOSIS — I6523 Occlusion and stenosis of bilateral carotid arteries: Secondary | ICD-10-CM | POA: Diagnosis not present

## 2022-11-22 NOTE — Progress Notes (Signed)
[facility]  Patient name: Todd George MRN: [id number] DOB: 1941-06-25 Sex: male  REASON FOR CONSULT: Evaluation carotid disease  HPI: Todd George is a 81 y.o. male, who is here today for evaluation of carotid disease.  He reports that he was told years ago that he did have some carotid disease after a Doppler done when he lived in [place].  He has a known right carotid bruit.  He recently underwent carotid duplex at [facility] and is here today for discussion.  He specifically denies any prior history of amaurosis fugax, transient ischemic attack or stroke.  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   BPH (benign prostatic hyperplasia)    Cancer ([facility])    skin cancer   Carotid artery disease ([facility])    Cellulitis    Chronic back pain    Colon polyps 01/04/2016   COPD (chronic obstructive pulmonary disease) ([place])    2ppd   Diabetes mellitus without complication ([place])    Duodenal erosion 01/02/2016   Gastric erosion with bleeding 01/02/2016   Hyperlipidemia    Hypertension    OSA on CPAP    not used machine in over 10 yrs    Family History  Problem Relation Age of Onset   Cancer Mother     SOCIAL HISTORY: Social History   Socioeconomic History   Marital status: Divorced    Spouse name: Not on file   Number of children: Not on file   Years of education: Not on file   Highest education level: Not on file  Occupational History   Not on file  Tobacco Use   Smoking status: Every Day    Packs/day: 2.00    Years: 64.00    Total pack years: 128.00    Types: Cigarettes   Smokeless tobacco: Never  Vaping Use   Vaping Use: Former   Start date: 08/20/2018  Substance and Sexual Activity   Alcohol use: Yes    Alcohol/week: 0.0 standard drinks of alcohol    Comment: rare   Drug use: No   Sexual activity: Never  Other  Topics Concern   Not on file  Social History Narrative   Divorced   Lives alone    [place] here from [place] in Jan 2016   Retired - Agricultural consultant   Social Determinants of Radio broadcast assistant Strain: Not on Art therapist Insecurity: Not on file  Transportation Needs: Not on file  Physical Activity: Not on file  Stress: Not on file  Social Connections: Not on file  Intimate Partner Violence: Not on file    Allergies  Allergen Reactions   Nsaids Other (See Comments)    Recurrent GI bleed    Current Outpatient Medications  Medication Sig Dispense Refill   calcium-vitamin D (OSCAL WITH D) 500-200 MG-UNIT tablet Take 1 tablet by mouth daily with breakfast.     clotrimazole-betamethasone (LOTRISONE) cream Apply 1 application topically daily as needed (for jock itch).      cyclobenzaprine (FLEXERIL) 10 MG tablet Take 10 mg by mouth 3 (three) times daily as needed.     docusate sodium (COLACE) 100 MG capsule Take 1 capsule (100 mg total) by mouth 2 (two) times daily. 10 capsule 0   ferrous sulfate 325 (65 FE) MG tablet Take 325 mg by mouth every 7 (seven) days.     gabapentin (  NEURONTIN) 100 MG capsule Take 100 mg by mouth 2 (two) times daily.     GEMTESA 75 MG TABS      HYDROcodone-acetaminophen (NORCO/VICODIN) 5-325 MG tablet Take 1-2 tablets by mouth every 4 (four) hours as needed for severe pain. 42 tablet 0   hydrOXYzine (ATARAX/VISTARIL) 25 MG tablet Take 50 mg by mouth every 6 (six) hours as needed for itching.     lisinopril-hydrochlorothiazide (PRINZIDE,ZESTORETIC) 20-12.5 MG tablet Take 1 tablet by mouth daily.      losartan (COZAAR) 50 MG tablet Take 50 mg by mouth daily.      methocarbamol (ROBAXIN) 500 MG tablet Take 1 tablet (500 mg total) by mouth every 6 (six) hours as needed for muscle spasms. 40 tablet 0   metoprolol succinate (TOPROL-XL) 25 MG 24 hr tablet Take 25 mg by mouth daily.     Multiple Vitamins-Minerals (PRESERVISION AREDS 2) CAPS Take 1 capsule by  mouth 2 (two) times daily.      oxybutynin (DITROPAN) 5 MG tablet Take 5 mg by mouth daily.     polyethylene glycol (MIRALAX / GLYCOLAX) 17 g packet Take 17 g by mouth daily as needed for mild constipation. 14 each 0   PSYLLIUM PO Take 2 [place] by mouth daily.     rOPINIRole (REQUIP) 0.5 MG tablet Take 0.5 mg by mouth 2 (two) times daily as needed (restless leg).     simvastatin (ZOCOR) 20 MG tablet Take 20 mg by mouth at bedtime.     tamsulosin (FLOMAX) 0.4 MG CAPS capsule Take 0.4 mg by mouth daily.      traMADol (ULTRAM) 50 MG tablet      rivaroxaban (XARELTO) 10 MG TABS tablet Take 1 tablet (10 mg total) by mouth daily with breakfast for 14 days. 14 tablet 0   No current facility-administered medications for this visit.    REVIEW OF SYSTEMS:  '[X]'$  denotes positive finding, '[ ]'$  denotes negative finding Cardiac  Comments:  Chest pain or chest pressure:    Shortness of breath upon exertion:    Short of breath when lying flat:    Irregular heart rhythm:        Vascular    Pain in calf, thigh, or hip brought on by ambulation:    Pain in feet at night that wakes you up from your sleep:     Blood clot in your veins:    Leg swelling:         Pulmonary    Oxygen at home:    Productive cough:     Wheezing:         Neurologic    Sudden weakness in arms or legs:     Sudden numbness in arms or legs:     Sudden onset of difficulty speaking or slurred speech:    Temporary loss of vision in one eye:     Problems with dizziness:         Gastrointestinal    Blood in stool:     Vomited blood:         Genitourinary    Burning when urinating:     Blood in urine:        Psychiatric    Major depression:         Hematologic    Bleeding problems:    Problems with blood clotting too easily:        Skin    Rashes or ulcers:        Constitutional  Fever or chills:      PHYSICAL EXAM: Vitals:   11/22/22 1448 11/22/22 1450  BP: (!) 164/78 (!) 167/71  Pulse: 60   Temp: 99.3 F  (37.4 C)   SpO2: 96%   Weight: 184 lb 9.6 oz (83.7 kg)   Height: '5\' 7"'$  (1.702 m)     GENERAL: The patient is a well-nourished male, in no acute distress. The vital signs are documented above. CARDIOVASCULAR: Soft right carotid bruit and no bruit on the left.  2+ radial pulses bilaterally. PULMONARY: There is good air exchange  MUSCULOSKELETAL: There are no major deformities or cyanosis. NEUROLOGIC: No focal weakness or paresthesias are detected. SKIN: There are no ulcers or rashes noted. PSYCHIATRIC: The patient has a normal affect.  DATA:  I reviewed his recent carotid duplex from [facility] on 11/03/2022.  His predicted level of stenosis was 50 to 69% internal carotid artery stenosis bilaterally.  By flow criteria this is certainly at the lower end of this range.  He had antegrade vertebral flow bilaterally  MEDICAL ISSUES: I discussed the significance of his moderate bilateral carotid stenosis.  I discussed symptoms of carotid disease and he knows to report immediate to the emergency room should this occur with any strokelike symptoms.  I have recommended that we see him again in 1 year with repeat carotid duplex to rule out progression of his asymptomatic disease.  I explained that we would recommend endarterectomy for greater than 80% asymptomatic stenosis or moderate symptomatic disease.   Rosetta Posner, MD FACS [facility] [phone] Pager [phone]  Note: Portions of this report may have been transcribed using voice recognition software.  Every effort has been made to ensure accuracy; however, inadvertent computerized transcription errors may still be present.

## 2022-12-02 DIAGNOSIS — J449 Chronic obstructive pulmonary disease, unspecified: Secondary | ICD-10-CM | POA: Diagnosis not present

## 2022-12-02 DIAGNOSIS — J09X2 Influenza due to identified novel influenza A virus with other respiratory manifestations: Secondary | ICD-10-CM | POA: Diagnosis not present

## 2022-12-07 DIAGNOSIS — H35371 Puckering of macula, right eye: Secondary | ICD-10-CM | POA: Diagnosis not present

## 2022-12-07 DIAGNOSIS — H43811 Vitreous degeneration, right eye: Secondary | ICD-10-CM | POA: Diagnosis not present

## 2022-12-07 DIAGNOSIS — Z9889 Other specified postprocedural states: Secondary | ICD-10-CM | POA: Diagnosis not present

## 2022-12-07 DIAGNOSIS — Z8669 Personal history of other diseases of the nervous system and sense organs: Secondary | ICD-10-CM | POA: Diagnosis not present

## 2022-12-07 DIAGNOSIS — H353231 Exudative age-related macular degeneration, bilateral, with active choroidal neovascularization: Secondary | ICD-10-CM | POA: Diagnosis not present

## 2022-12-07 DIAGNOSIS — H35431 Paving stone degeneration of retina, right eye: Secondary | ICD-10-CM | POA: Diagnosis not present

## 2022-12-20 DIAGNOSIS — E119 Type 2 diabetes mellitus without complications: Secondary | ICD-10-CM | POA: Diagnosis not present

## 2022-12-20 DIAGNOSIS — H33012 Retinal detachment with single break, left eye: Secondary | ICD-10-CM | POA: Diagnosis not present

## 2022-12-20 DIAGNOSIS — Z961 Presence of intraocular lens: Secondary | ICD-10-CM | POA: Diagnosis not present

## 2022-12-20 DIAGNOSIS — H353132 Nonexudative age-related macular degeneration, bilateral, intermediate dry stage: Secondary | ICD-10-CM | POA: Diagnosis not present

## 2023-01-11 DIAGNOSIS — H35431 Paving stone degeneration of retina, right eye: Secondary | ICD-10-CM | POA: Diagnosis not present

## 2023-01-11 DIAGNOSIS — H04123 Dry eye syndrome of bilateral lacrimal glands: Secondary | ICD-10-CM | POA: Diagnosis not present

## 2023-01-11 DIAGNOSIS — H59812 Chorioretinal scars after surgery for detachment, left eye: Secondary | ICD-10-CM | POA: Diagnosis not present

## 2023-01-11 DIAGNOSIS — H35033 Hypertensive retinopathy, bilateral: Secondary | ICD-10-CM | POA: Diagnosis not present

## 2023-01-11 DIAGNOSIS — H353211 Exudative age-related macular degeneration, right eye, with active choroidal neovascularization: Secondary | ICD-10-CM | POA: Diagnosis not present

## 2023-01-11 DIAGNOSIS — H35371 Puckering of macula, right eye: Secondary | ICD-10-CM | POA: Diagnosis not present

## 2023-01-11 DIAGNOSIS — H353221 Exudative age-related macular degeneration, left eye, with active choroidal neovascularization: Secondary | ICD-10-CM | POA: Diagnosis not present

## 2023-01-11 DIAGNOSIS — H353231 Exudative age-related macular degeneration, bilateral, with active choroidal neovascularization: Secondary | ICD-10-CM | POA: Diagnosis not present

## 2023-01-26 DIAGNOSIS — R413 Other amnesia: Secondary | ICD-10-CM | POA: Diagnosis not present

## 2023-01-26 DIAGNOSIS — E1169 Type 2 diabetes mellitus with other specified complication: Secondary | ICD-10-CM | POA: Diagnosis not present

## 2023-01-26 DIAGNOSIS — G3184 Mild cognitive impairment, so stated: Secondary | ICD-10-CM | POA: Diagnosis not present

## 2023-01-26 DIAGNOSIS — Z125 Encounter for screening for malignant neoplasm of prostate: Secondary | ICD-10-CM | POA: Diagnosis not present

## 2023-01-26 DIAGNOSIS — I10 Essential (primary) hypertension: Secondary | ICD-10-CM | POA: Diagnosis not present

## 2023-02-01 DIAGNOSIS — E1122 Type 2 diabetes mellitus with diabetic chronic kidney disease: Secondary | ICD-10-CM | POA: Diagnosis not present

## 2023-02-01 DIAGNOSIS — Z23 Encounter for immunization: Secondary | ICD-10-CM | POA: Diagnosis not present

## 2023-02-01 DIAGNOSIS — G2581 Restless legs syndrome: Secondary | ICD-10-CM | POA: Diagnosis not present

## 2023-02-01 DIAGNOSIS — E782 Mixed hyperlipidemia: Secondary | ICD-10-CM | POA: Diagnosis not present

## 2023-02-01 DIAGNOSIS — N1831 Chronic kidney disease, stage 3a: Secondary | ICD-10-CM | POA: Diagnosis not present

## 2023-02-01 DIAGNOSIS — E1169 Type 2 diabetes mellitus with other specified complication: Secondary | ICD-10-CM | POA: Diagnosis not present

## 2023-02-01 DIAGNOSIS — I129 Hypertensive chronic kidney disease with stage 1 through stage 4 chronic kidney disease, or unspecified chronic kidney disease: Secondary | ICD-10-CM | POA: Diagnosis not present

## 2023-02-01 DIAGNOSIS — R944 Abnormal results of kidney function studies: Secondary | ICD-10-CM | POA: Diagnosis not present

## 2023-02-01 DIAGNOSIS — R011 Cardiac murmur, unspecified: Secondary | ICD-10-CM | POA: Diagnosis not present

## 2023-02-15 DIAGNOSIS — H353231 Exudative age-related macular degeneration, bilateral, with active choroidal neovascularization: Secondary | ICD-10-CM | POA: Diagnosis not present

## 2023-02-15 DIAGNOSIS — H35033 Hypertensive retinopathy, bilateral: Secondary | ICD-10-CM | POA: Diagnosis not present

## 2023-02-15 DIAGNOSIS — H35431 Paving stone degeneration of retina, right eye: Secondary | ICD-10-CM | POA: Diagnosis not present

## 2023-02-15 DIAGNOSIS — H04123 Dry eye syndrome of bilateral lacrimal glands: Secondary | ICD-10-CM | POA: Diagnosis not present

## 2023-02-15 DIAGNOSIS — H35373 Puckering of macula, bilateral: Secondary | ICD-10-CM | POA: Diagnosis not present

## 2023-02-15 DIAGNOSIS — H59812 Chorioretinal scars after surgery for detachment, left eye: Secondary | ICD-10-CM | POA: Diagnosis not present

## 2023-02-23 DIAGNOSIS — G3184 Mild cognitive impairment, so stated: Secondary | ICD-10-CM | POA: Diagnosis not present

## 2023-03-07 DIAGNOSIS — J329 Chronic sinusitis, unspecified: Secondary | ICD-10-CM | POA: Diagnosis not present

## 2023-03-07 DIAGNOSIS — F17210 Nicotine dependence, cigarettes, uncomplicated: Secondary | ICD-10-CM | POA: Diagnosis not present

## 2023-03-07 DIAGNOSIS — R944 Abnormal results of kidney function studies: Secondary | ICD-10-CM | POA: Diagnosis not present

## 2023-04-05 DIAGNOSIS — H35373 Puckering of macula, bilateral: Secondary | ICD-10-CM | POA: Diagnosis not present

## 2023-04-05 DIAGNOSIS — H353231 Exudative age-related macular degeneration, bilateral, with active choroidal neovascularization: Secondary | ICD-10-CM | POA: Diagnosis not present

## 2023-04-05 DIAGNOSIS — H35033 Hypertensive retinopathy, bilateral: Secondary | ICD-10-CM | POA: Diagnosis not present

## 2023-04-05 DIAGNOSIS — H59812 Chorioretinal scars after surgery for detachment, left eye: Secondary | ICD-10-CM | POA: Diagnosis not present

## 2023-04-05 DIAGNOSIS — H35431 Paving stone degeneration of retina, right eye: Secondary | ICD-10-CM | POA: Diagnosis not present

## 2023-04-05 DIAGNOSIS — H04123 Dry eye syndrome of bilateral lacrimal glands: Secondary | ICD-10-CM | POA: Diagnosis not present

## 2023-05-09 DIAGNOSIS — N3281 Overactive bladder: Secondary | ICD-10-CM | POA: Diagnosis not present

## 2023-05-09 DIAGNOSIS — I10 Essential (primary) hypertension: Secondary | ICD-10-CM | POA: Diagnosis not present

## 2023-05-09 DIAGNOSIS — F17210 Nicotine dependence, cigarettes, uncomplicated: Secondary | ICD-10-CM | POA: Diagnosis not present

## 2023-05-09 DIAGNOSIS — F17200 Nicotine dependence, unspecified, uncomplicated: Secondary | ICD-10-CM | POA: Diagnosis not present

## 2023-05-09 DIAGNOSIS — L0293 Carbuncle, unspecified: Secondary | ICD-10-CM | POA: Diagnosis not present

## 2023-05-09 DIAGNOSIS — L0213 Carbuncle of neck: Secondary | ICD-10-CM | POA: Diagnosis not present

## 2023-05-09 DIAGNOSIS — B356 Tinea cruris: Secondary | ICD-10-CM | POA: Diagnosis not present

## 2023-05-09 DIAGNOSIS — G2581 Restless legs syndrome: Secondary | ICD-10-CM | POA: Diagnosis not present

## 2023-05-09 DIAGNOSIS — G3184 Mild cognitive impairment, so stated: Secondary | ICD-10-CM | POA: Diagnosis not present

## 2023-05-17 DIAGNOSIS — H35033 Hypertensive retinopathy, bilateral: Secondary | ICD-10-CM | POA: Diagnosis not present

## 2023-05-17 DIAGNOSIS — H353231 Exudative age-related macular degeneration, bilateral, with active choroidal neovascularization: Secondary | ICD-10-CM | POA: Diagnosis not present

## 2023-05-17 DIAGNOSIS — H04123 Dry eye syndrome of bilateral lacrimal glands: Secondary | ICD-10-CM | POA: Diagnosis not present

## 2023-05-17 DIAGNOSIS — H35431 Paving stone degeneration of retina, right eye: Secondary | ICD-10-CM | POA: Diagnosis not present

## 2023-05-17 DIAGNOSIS — H35373 Puckering of macula, bilateral: Secondary | ICD-10-CM | POA: Diagnosis not present

## 2023-05-17 DIAGNOSIS — H59812 Chorioretinal scars after surgery for detachment, left eye: Secondary | ICD-10-CM | POA: Diagnosis not present

## 2023-05-23 DIAGNOSIS — M4180 Other forms of scoliosis, site unspecified: Secondary | ICD-10-CM | POA: Diagnosis not present

## 2023-05-23 DIAGNOSIS — M47816 Spondylosis without myelopathy or radiculopathy, lumbar region: Secondary | ICD-10-CM | POA: Diagnosis not present

## 2023-05-23 DIAGNOSIS — M25551 Pain in right hip: Secondary | ICD-10-CM | POA: Diagnosis not present

## 2023-05-31 DIAGNOSIS — E1169 Type 2 diabetes mellitus with other specified complication: Secondary | ICD-10-CM | POA: Diagnosis not present

## 2023-05-31 DIAGNOSIS — I10 Essential (primary) hypertension: Secondary | ICD-10-CM | POA: Diagnosis not present

## 2023-06-06 DIAGNOSIS — I10 Essential (primary) hypertension: Secondary | ICD-10-CM | POA: Diagnosis not present

## 2023-06-06 DIAGNOSIS — I129 Hypertensive chronic kidney disease with stage 1 through stage 4 chronic kidney disease, or unspecified chronic kidney disease: Secondary | ICD-10-CM | POA: Diagnosis not present

## 2023-06-06 DIAGNOSIS — E782 Mixed hyperlipidemia: Secondary | ICD-10-CM | POA: Diagnosis not present

## 2023-06-06 DIAGNOSIS — F17200 Nicotine dependence, unspecified, uncomplicated: Secondary | ICD-10-CM | POA: Diagnosis not present

## 2023-06-06 DIAGNOSIS — N3281 Overactive bladder: Secondary | ICD-10-CM | POA: Diagnosis not present

## 2023-06-06 DIAGNOSIS — G3184 Mild cognitive impairment, so stated: Secondary | ICD-10-CM | POA: Diagnosis not present

## 2023-06-06 DIAGNOSIS — N1831 Chronic kidney disease, stage 3a: Secondary | ICD-10-CM | POA: Diagnosis not present

## 2023-06-06 DIAGNOSIS — G2581 Restless legs syndrome: Secondary | ICD-10-CM | POA: Diagnosis not present

## 2023-06-06 DIAGNOSIS — E1122 Type 2 diabetes mellitus with diabetic chronic kidney disease: Secondary | ICD-10-CM | POA: Diagnosis not present

## 2023-06-14 DIAGNOSIS — Z961 Presence of intraocular lens: Secondary | ICD-10-CM | POA: Diagnosis not present

## 2023-06-14 DIAGNOSIS — E119 Type 2 diabetes mellitus without complications: Secondary | ICD-10-CM | POA: Diagnosis not present

## 2023-06-14 DIAGNOSIS — H353233 Exudative age-related macular degeneration, bilateral, with inactive scar: Secondary | ICD-10-CM | POA: Diagnosis not present

## 2023-06-14 DIAGNOSIS — Z0100 Encounter for examination of eyes and vision without abnormal findings: Secondary | ICD-10-CM | POA: Diagnosis not present

## 2023-07-04 DIAGNOSIS — M5136 Other intervertebral disc degeneration, lumbar region: Secondary | ICD-10-CM | POA: Diagnosis not present

## 2023-07-05 DIAGNOSIS — H353231 Exudative age-related macular degeneration, bilateral, with active choroidal neovascularization: Secondary | ICD-10-CM | POA: Diagnosis not present

## 2023-07-11 DIAGNOSIS — L820 Inflamed seborrheic keratosis: Secondary | ICD-10-CM | POA: Diagnosis not present

## 2023-07-11 DIAGNOSIS — M5136 Other intervertebral disc degeneration, lumbar region: Secondary | ICD-10-CM | POA: Diagnosis not present

## 2023-07-11 DIAGNOSIS — Z1283 Encounter for screening for malignant neoplasm of skin: Secondary | ICD-10-CM | POA: Diagnosis not present

## 2023-07-11 DIAGNOSIS — D225 Melanocytic nevi of trunk: Secondary | ICD-10-CM | POA: Diagnosis not present

## 2023-07-11 DIAGNOSIS — C44629 Squamous cell carcinoma of skin of left upper limb, including shoulder: Secondary | ICD-10-CM | POA: Diagnosis not present

## 2023-07-18 DIAGNOSIS — M5136 Other intervertebral disc degeneration, lumbar region: Secondary | ICD-10-CM | POA: Diagnosis not present

## 2023-07-26 DIAGNOSIS — M5136 Other intervertebral disc degeneration, lumbar region: Secondary | ICD-10-CM | POA: Diagnosis not present

## 2023-08-09 DIAGNOSIS — M5136 Other intervertebral disc degeneration, lumbar region: Secondary | ICD-10-CM | POA: Diagnosis not present

## 2023-08-16 DIAGNOSIS — M5136 Other intervertebral disc degeneration, lumbar region: Secondary | ICD-10-CM | POA: Diagnosis not present

## 2023-08-20 DIAGNOSIS — H04123 Dry eye syndrome of bilateral lacrimal glands: Secondary | ICD-10-CM | POA: Diagnosis not present

## 2023-08-20 DIAGNOSIS — H59812 Chorioretinal scars after surgery for detachment, left eye: Secondary | ICD-10-CM | POA: Diagnosis not present

## 2023-08-20 DIAGNOSIS — H35033 Hypertensive retinopathy, bilateral: Secondary | ICD-10-CM | POA: Diagnosis not present

## 2023-08-20 DIAGNOSIS — H35431 Paving stone degeneration of retina, right eye: Secondary | ICD-10-CM | POA: Diagnosis not present

## 2023-08-20 DIAGNOSIS — H35373 Puckering of macula, bilateral: Secondary | ICD-10-CM | POA: Diagnosis not present

## 2023-08-20 DIAGNOSIS — H353231 Exudative age-related macular degeneration, bilateral, with active choroidal neovascularization: Secondary | ICD-10-CM | POA: Diagnosis not present

## 2023-08-23 DIAGNOSIS — L570 Actinic keratosis: Secondary | ICD-10-CM | POA: Diagnosis not present

## 2023-08-23 DIAGNOSIS — C44319 Basal cell carcinoma of skin of other parts of face: Secondary | ICD-10-CM | POA: Diagnosis not present

## 2023-08-23 DIAGNOSIS — Z85828 Personal history of other malignant neoplasm of skin: Secondary | ICD-10-CM | POA: Diagnosis not present

## 2023-08-23 DIAGNOSIS — Z08 Encounter for follow-up examination after completed treatment for malignant neoplasm: Secondary | ICD-10-CM | POA: Diagnosis not present

## 2023-08-23 DIAGNOSIS — X32XXXD Exposure to sunlight, subsequent encounter: Secondary | ICD-10-CM | POA: Diagnosis not present

## 2023-08-23 DIAGNOSIS — L820 Inflamed seborrheic keratosis: Secondary | ICD-10-CM | POA: Diagnosis not present

## 2023-08-30 DIAGNOSIS — M5136 Other intervertebral disc degeneration, lumbar region: Secondary | ICD-10-CM | POA: Diagnosis not present

## 2023-10-01 DIAGNOSIS — H353231 Exudative age-related macular degeneration, bilateral, with active choroidal neovascularization: Secondary | ICD-10-CM | POA: Diagnosis not present

## 2023-10-02 DIAGNOSIS — I10 Essential (primary) hypertension: Secondary | ICD-10-CM | POA: Diagnosis not present

## 2023-10-02 DIAGNOSIS — E1169 Type 2 diabetes mellitus with other specified complication: Secondary | ICD-10-CM | POA: Diagnosis not present

## 2023-10-04 DIAGNOSIS — Z85828 Personal history of other malignant neoplasm of skin: Secondary | ICD-10-CM | POA: Diagnosis not present

## 2023-10-04 DIAGNOSIS — Z08 Encounter for follow-up examination after completed treatment for malignant neoplasm: Secondary | ICD-10-CM | POA: Diagnosis not present

## 2023-10-04 DIAGNOSIS — L821 Other seborrheic keratosis: Secondary | ICD-10-CM | POA: Diagnosis not present

## 2023-10-04 DIAGNOSIS — Z85858 Personal history of malignant neoplasm of other endocrine glands: Secondary | ICD-10-CM | POA: Diagnosis not present

## 2023-10-04 DIAGNOSIS — B078 Other viral warts: Secondary | ICD-10-CM | POA: Diagnosis not present

## 2023-10-10 DIAGNOSIS — D631 Anemia in chronic kidney disease: Secondary | ICD-10-CM | POA: Diagnosis not present

## 2023-10-10 DIAGNOSIS — G2581 Restless legs syndrome: Secondary | ICD-10-CM | POA: Diagnosis not present

## 2023-10-10 DIAGNOSIS — M545 Low back pain, unspecified: Secondary | ICD-10-CM | POA: Diagnosis not present

## 2023-10-10 DIAGNOSIS — I129 Hypertensive chronic kidney disease with stage 1 through stage 4 chronic kidney disease, or unspecified chronic kidney disease: Secondary | ICD-10-CM | POA: Diagnosis not present

## 2023-10-10 DIAGNOSIS — N1831 Chronic kidney disease, stage 3a: Secondary | ICD-10-CM | POA: Diagnosis not present

## 2023-10-10 DIAGNOSIS — F17200 Nicotine dependence, unspecified, uncomplicated: Secondary | ICD-10-CM | POA: Diagnosis not present

## 2023-10-10 DIAGNOSIS — Z23 Encounter for immunization: Secondary | ICD-10-CM | POA: Diagnosis not present

## 2023-10-10 DIAGNOSIS — I10 Essential (primary) hypertension: Secondary | ICD-10-CM | POA: Diagnosis not present

## 2023-10-10 DIAGNOSIS — N3281 Overactive bladder: Secondary | ICD-10-CM | POA: Diagnosis not present

## 2023-11-12 DIAGNOSIS — H35373 Puckering of macula, bilateral: Secondary | ICD-10-CM | POA: Diagnosis not present

## 2023-11-12 DIAGNOSIS — H35033 Hypertensive retinopathy, bilateral: Secondary | ICD-10-CM | POA: Diagnosis not present

## 2023-11-12 DIAGNOSIS — H59812 Chorioretinal scars after surgery for detachment, left eye: Secondary | ICD-10-CM | POA: Diagnosis not present

## 2023-11-12 DIAGNOSIS — H04123 Dry eye syndrome of bilateral lacrimal glands: Secondary | ICD-10-CM | POA: Diagnosis not present

## 2023-11-12 DIAGNOSIS — H35431 Paving stone degeneration of retina, right eye: Secondary | ICD-10-CM | POA: Diagnosis not present

## 2023-11-12 DIAGNOSIS — H353231 Exudative age-related macular degeneration, bilateral, with active choroidal neovascularization: Secondary | ICD-10-CM | POA: Diagnosis not present

## 2023-11-14 DIAGNOSIS — I10 Essential (primary) hypertension: Secondary | ICD-10-CM | POA: Diagnosis not present

## 2023-11-14 DIAGNOSIS — E1169 Type 2 diabetes mellitus with other specified complication: Secondary | ICD-10-CM | POA: Diagnosis not present

## 2024-01-03 DIAGNOSIS — H353231 Exudative age-related macular degeneration, bilateral, with active choroidal neovascularization: Secondary | ICD-10-CM | POA: Diagnosis not present

## 2024-02-12 DIAGNOSIS — E119 Type 2 diabetes mellitus without complications: Secondary | ICD-10-CM | POA: Diagnosis not present

## 2024-02-12 DIAGNOSIS — E782 Mixed hyperlipidemia: Secondary | ICD-10-CM | POA: Diagnosis not present

## 2024-02-14 DIAGNOSIS — H353231 Exudative age-related macular degeneration, bilateral, with active choroidal neovascularization: Secondary | ICD-10-CM | POA: Diagnosis not present

## 2024-02-18 DIAGNOSIS — I10 Essential (primary) hypertension: Secondary | ICD-10-CM | POA: Diagnosis not present

## 2024-02-18 DIAGNOSIS — N1831 Chronic kidney disease, stage 3a: Secondary | ICD-10-CM | POA: Diagnosis not present

## 2024-02-18 DIAGNOSIS — E1159 Type 2 diabetes mellitus with other circulatory complications: Secondary | ICD-10-CM | POA: Diagnosis not present

## 2024-02-18 DIAGNOSIS — E1122 Type 2 diabetes mellitus with diabetic chronic kidney disease: Secondary | ICD-10-CM | POA: Diagnosis not present

## 2024-02-18 DIAGNOSIS — F17200 Nicotine dependence, unspecified, uncomplicated: Secondary | ICD-10-CM | POA: Diagnosis not present

## 2024-02-18 DIAGNOSIS — G2581 Restless legs syndrome: Secondary | ICD-10-CM | POA: Diagnosis not present

## 2024-02-18 DIAGNOSIS — E1169 Type 2 diabetes mellitus with other specified complication: Secondary | ICD-10-CM | POA: Diagnosis not present

## 2024-02-18 DIAGNOSIS — L309 Dermatitis, unspecified: Secondary | ICD-10-CM | POA: Diagnosis not present

## 2024-02-18 DIAGNOSIS — I129 Hypertensive chronic kidney disease with stage 1 through stage 4 chronic kidney disease, or unspecified chronic kidney disease: Secondary | ICD-10-CM | POA: Diagnosis not present

## 2024-03-27 DIAGNOSIS — H353231 Exudative age-related macular degeneration, bilateral, with active choroidal neovascularization: Secondary | ICD-10-CM | POA: Diagnosis not present

## 2024-03-27 DIAGNOSIS — H35033 Hypertensive retinopathy, bilateral: Secondary | ICD-10-CM | POA: Diagnosis not present

## 2024-03-27 DIAGNOSIS — H35431 Paving stone degeneration of retina, right eye: Secondary | ICD-10-CM | POA: Diagnosis not present

## 2024-03-27 DIAGNOSIS — H35373 Puckering of macula, bilateral: Secondary | ICD-10-CM | POA: Diagnosis not present

## 2024-03-27 DIAGNOSIS — H04123 Dry eye syndrome of bilateral lacrimal glands: Secondary | ICD-10-CM | POA: Diagnosis not present

## 2024-03-27 DIAGNOSIS — H31092 Other chorioretinal scars, left eye: Secondary | ICD-10-CM | POA: Diagnosis not present

## 2024-04-15 ENCOUNTER — Other Ambulatory Visit (HOSPITAL_COMMUNITY): Payer: Self-pay | Admitting: Family Medicine

## 2024-04-15 DIAGNOSIS — M545 Low back pain, unspecified: Secondary | ICD-10-CM

## 2024-04-15 DIAGNOSIS — L309 Dermatitis, unspecified: Secondary | ICD-10-CM | POA: Diagnosis not present

## 2024-04-15 DIAGNOSIS — L989 Disorder of the skin and subcutaneous tissue, unspecified: Secondary | ICD-10-CM | POA: Diagnosis not present

## 2024-04-15 DIAGNOSIS — Z9181 History of falling: Secondary | ICD-10-CM | POA: Diagnosis not present

## 2024-05-08 DIAGNOSIS — Z85828 Personal history of other malignant neoplasm of skin: Secondary | ICD-10-CM | POA: Diagnosis not present

## 2024-05-08 DIAGNOSIS — L8990 Pressure ulcer of unspecified site, unspecified stage: Secondary | ICD-10-CM | POA: Diagnosis not present

## 2024-05-08 DIAGNOSIS — Z08 Encounter for follow-up examination after completed treatment for malignant neoplasm: Secondary | ICD-10-CM | POA: Diagnosis not present

## 2024-05-08 DIAGNOSIS — M7031 Other bursitis of elbow, right elbow: Secondary | ICD-10-CM | POA: Diagnosis not present

## 2024-05-15 DIAGNOSIS — H353231 Exudative age-related macular degeneration, bilateral, with active choroidal neovascularization: Secondary | ICD-10-CM | POA: Diagnosis not present

## 2024-05-15 DIAGNOSIS — H35373 Puckering of macula, bilateral: Secondary | ICD-10-CM | POA: Diagnosis not present

## 2024-05-15 DIAGNOSIS — H04123 Dry eye syndrome of bilateral lacrimal glands: Secondary | ICD-10-CM | POA: Diagnosis not present

## 2024-05-15 DIAGNOSIS — H35033 Hypertensive retinopathy, bilateral: Secondary | ICD-10-CM | POA: Diagnosis not present

## 2024-05-15 DIAGNOSIS — H59812 Chorioretinal scars after surgery for detachment, left eye: Secondary | ICD-10-CM | POA: Diagnosis not present

## 2024-05-15 DIAGNOSIS — E119 Type 2 diabetes mellitus without complications: Secondary | ICD-10-CM | POA: Diagnosis not present

## 2024-05-15 DIAGNOSIS — H35431 Paving stone degeneration of retina, right eye: Secondary | ICD-10-CM | POA: Diagnosis not present

## 2024-05-30 ENCOUNTER — Telehealth: Payer: Self-pay

## 2024-06-02 NOTE — Telephone Encounter (Signed)
 Adherence Monitoring:  Up to date on meds, next review in August

## 2024-06-10 DIAGNOSIS — D649 Anemia, unspecified: Secondary | ICD-10-CM | POA: Diagnosis not present

## 2024-06-10 DIAGNOSIS — E1169 Type 2 diabetes mellitus with other specified complication: Secondary | ICD-10-CM | POA: Diagnosis not present

## 2024-06-10 DIAGNOSIS — I10 Essential (primary) hypertension: Secondary | ICD-10-CM | POA: Diagnosis not present

## 2024-06-17 DIAGNOSIS — I129 Hypertensive chronic kidney disease with stage 1 through stage 4 chronic kidney disease, or unspecified chronic kidney disease: Secondary | ICD-10-CM | POA: Diagnosis not present

## 2024-06-17 DIAGNOSIS — N3281 Overactive bladder: Secondary | ICD-10-CM | POA: Diagnosis not present

## 2024-06-17 DIAGNOSIS — G2581 Restless legs syndrome: Secondary | ICD-10-CM | POA: Diagnosis not present

## 2024-06-17 DIAGNOSIS — F17200 Nicotine dependence, unspecified, uncomplicated: Secondary | ICD-10-CM | POA: Diagnosis not present

## 2024-06-17 DIAGNOSIS — E1169 Type 2 diabetes mellitus with other specified complication: Secondary | ICD-10-CM | POA: Diagnosis not present

## 2024-06-17 DIAGNOSIS — E782 Mixed hyperlipidemia: Secondary | ICD-10-CM | POA: Diagnosis not present

## 2024-06-17 DIAGNOSIS — I10 Essential (primary) hypertension: Secondary | ICD-10-CM | POA: Diagnosis not present

## 2024-06-17 DIAGNOSIS — D631 Anemia in chronic kidney disease: Secondary | ICD-10-CM | POA: Diagnosis not present

## 2024-06-17 DIAGNOSIS — E1159 Type 2 diabetes mellitus with other circulatory complications: Secondary | ICD-10-CM | POA: Diagnosis not present

## 2024-06-30 DIAGNOSIS — S40811A Abrasion of right upper arm, initial encounter: Secondary | ICD-10-CM | POA: Diagnosis not present

## 2024-06-30 DIAGNOSIS — C44622 Squamous cell carcinoma of skin of right upper limb, including shoulder: Secondary | ICD-10-CM | POA: Diagnosis not present

## 2024-06-30 DIAGNOSIS — L820 Inflamed seborrheic keratosis: Secondary | ICD-10-CM | POA: Diagnosis not present

## 2024-06-30 DIAGNOSIS — R208 Other disturbances of skin sensation: Secondary | ICD-10-CM | POA: Diagnosis not present

## 2024-07-07 DIAGNOSIS — M654 Radial styloid tenosynovitis [de Quervain]: Secondary | ICD-10-CM | POA: Diagnosis not present

## 2024-07-10 DIAGNOSIS — H35431 Paving stone degeneration of retina, right eye: Secondary | ICD-10-CM | POA: Diagnosis not present

## 2024-07-10 DIAGNOSIS — H31092 Other chorioretinal scars, left eye: Secondary | ICD-10-CM | POA: Diagnosis not present

## 2024-07-10 DIAGNOSIS — E119 Type 2 diabetes mellitus without complications: Secondary | ICD-10-CM | POA: Diagnosis not present

## 2024-07-10 DIAGNOSIS — H353231 Exudative age-related macular degeneration, bilateral, with active choroidal neovascularization: Secondary | ICD-10-CM | POA: Diagnosis not present

## 2024-07-10 DIAGNOSIS — H35033 Hypertensive retinopathy, bilateral: Secondary | ICD-10-CM | POA: Diagnosis not present

## 2024-07-10 DIAGNOSIS — H35373 Puckering of macula, bilateral: Secondary | ICD-10-CM | POA: Diagnosis not present

## 2024-07-10 DIAGNOSIS — H04123 Dry eye syndrome of bilateral lacrimal glands: Secondary | ICD-10-CM | POA: Diagnosis not present

## 2024-08-04 DIAGNOSIS — M654 Radial styloid tenosynovitis [de Quervain]: Secondary | ICD-10-CM | POA: Diagnosis not present

## 2024-08-11 DIAGNOSIS — Z08 Encounter for follow-up examination after completed treatment for malignant neoplasm: Secondary | ICD-10-CM | POA: Diagnosis not present

## 2024-08-11 DIAGNOSIS — Z85828 Personal history of other malignant neoplasm of skin: Secondary | ICD-10-CM | POA: Diagnosis not present

## 2024-08-11 DIAGNOSIS — L304 Erythema intertrigo: Secondary | ICD-10-CM | POA: Diagnosis not present

## 2024-08-26 DIAGNOSIS — M654 Radial styloid tenosynovitis [de Quervain]: Secondary | ICD-10-CM | POA: Diagnosis not present

## 2024-09-09 DIAGNOSIS — Z23 Encounter for immunization: Secondary | ICD-10-CM | POA: Diagnosis not present

## 2024-09-11 DIAGNOSIS — H353231 Exudative age-related macular degeneration, bilateral, with active choroidal neovascularization: Secondary | ICD-10-CM | POA: Diagnosis not present

## 2024-10-08 DIAGNOSIS — H524 Presbyopia: Secondary | ICD-10-CM | POA: Diagnosis not present

## 2024-10-08 DIAGNOSIS — H5211 Myopia, right eye: Secondary | ICD-10-CM | POA: Diagnosis not present

## 2024-10-14 DIAGNOSIS — D649 Anemia, unspecified: Secondary | ICD-10-CM | POA: Diagnosis not present

## 2024-10-14 DIAGNOSIS — E1169 Type 2 diabetes mellitus with other specified complication: Secondary | ICD-10-CM | POA: Diagnosis not present

## 2024-10-31 DIAGNOSIS — Z0000 Encounter for general adult medical examination without abnormal findings: Secondary | ICD-10-CM | POA: Diagnosis not present

## 2024-10-31 DIAGNOSIS — F17210 Nicotine dependence, cigarettes, uncomplicated: Secondary | ICD-10-CM | POA: Diagnosis not present

## 2024-10-31 DIAGNOSIS — G2581 Restless legs syndrome: Secondary | ICD-10-CM | POA: Diagnosis not present

## 2024-10-31 DIAGNOSIS — I10 Essential (primary) hypertension: Secondary | ICD-10-CM | POA: Diagnosis not present

## 2024-10-31 DIAGNOSIS — G3184 Mild cognitive impairment, so stated: Secondary | ICD-10-CM | POA: Diagnosis not present

## 2024-10-31 DIAGNOSIS — J329 Chronic sinusitis, unspecified: Secondary | ICD-10-CM | POA: Diagnosis not present

## 2024-10-31 DIAGNOSIS — R809 Proteinuria, unspecified: Secondary | ICD-10-CM | POA: Diagnosis not present

## 2024-10-31 DIAGNOSIS — R0989 Other specified symptoms and signs involving the circulatory and respiratory systems: Secondary | ICD-10-CM | POA: Diagnosis not present

## 2024-10-31 DIAGNOSIS — Z0001 Encounter for general adult medical examination with abnormal findings: Secondary | ICD-10-CM | POA: Diagnosis not present

## 2024-11-14 DIAGNOSIS — E1122 Type 2 diabetes mellitus with diabetic chronic kidney disease: Secondary | ICD-10-CM | POA: Diagnosis not present

## 2024-11-14 DIAGNOSIS — N1831 Chronic kidney disease, stage 3a: Secondary | ICD-10-CM | POA: Diagnosis not present

## 2024-11-14 DIAGNOSIS — E785 Hyperlipidemia, unspecified: Secondary | ICD-10-CM | POA: Diagnosis not present

## 2024-11-14 DIAGNOSIS — Z809 Family history of malignant neoplasm, unspecified: Secondary | ICD-10-CM | POA: Diagnosis not present

## 2024-11-14 DIAGNOSIS — R011 Cardiac murmur, unspecified: Secondary | ICD-10-CM | POA: Diagnosis not present

## 2024-11-14 DIAGNOSIS — N3941 Urge incontinence: Secondary | ICD-10-CM | POA: Diagnosis not present

## 2024-11-14 DIAGNOSIS — N400 Enlarged prostate without lower urinary tract symptoms: Secondary | ICD-10-CM | POA: Diagnosis not present

## 2024-11-14 DIAGNOSIS — G2581 Restless legs syndrome: Secondary | ICD-10-CM | POA: Diagnosis not present

## 2024-11-14 DIAGNOSIS — Z886 Allergy status to analgesic agent status: Secondary | ICD-10-CM | POA: Diagnosis not present

## 2024-11-14 DIAGNOSIS — E1142 Type 2 diabetes mellitus with diabetic polyneuropathy: Secondary | ICD-10-CM | POA: Diagnosis not present

## 2024-11-14 DIAGNOSIS — I129 Hypertensive chronic kidney disease with stage 1 through stage 4 chronic kidney disease, or unspecified chronic kidney disease: Secondary | ICD-10-CM | POA: Diagnosis not present

## 2024-11-24 DIAGNOSIS — H04123 Dry eye syndrome of bilateral lacrimal glands: Secondary | ICD-10-CM | POA: Diagnosis not present

## 2024-11-24 DIAGNOSIS — H35373 Puckering of macula, bilateral: Secondary | ICD-10-CM | POA: Diagnosis not present

## 2024-11-24 DIAGNOSIS — H31092 Other chorioretinal scars, left eye: Secondary | ICD-10-CM | POA: Diagnosis not present

## 2024-11-24 DIAGNOSIS — H353231 Exudative age-related macular degeneration, bilateral, with active choroidal neovascularization: Secondary | ICD-10-CM | POA: Diagnosis not present

## 2024-11-24 DIAGNOSIS — H35431 Paving stone degeneration of retina, right eye: Secondary | ICD-10-CM | POA: Diagnosis not present

## 2024-11-24 DIAGNOSIS — H35033 Hypertensive retinopathy, bilateral: Secondary | ICD-10-CM | POA: Diagnosis not present

## 2024-11-24 DIAGNOSIS — E119 Type 2 diabetes mellitus without complications: Secondary | ICD-10-CM | POA: Diagnosis not present
